# Patient Record
Sex: Female | Born: 1945 | Race: Black or African American | Hispanic: No | Marital: Married | Smoking: Current every day smoker
Health system: Southern US, Community
[De-identification: ages and names within clinical notes are randomized; demographics above are authoritative.]

## PROBLEM LIST (undated history)

## (undated) DIAGNOSIS — R05 Cough: Secondary | ICD-10-CM

## (undated) DIAGNOSIS — F419 Anxiety disorder, unspecified: Secondary | ICD-10-CM

## (undated) DIAGNOSIS — R059 Cough, unspecified: Secondary | ICD-10-CM

## (undated) DIAGNOSIS — K219 Gastro-esophageal reflux disease without esophagitis: Secondary | ICD-10-CM

## (undated) DIAGNOSIS — M1990 Unspecified osteoarthritis, unspecified site: Secondary | ICD-10-CM

## (undated) DIAGNOSIS — C50919 Malignant neoplasm of unspecified site of unspecified female breast: Secondary | ICD-10-CM

## (undated) DIAGNOSIS — Z1379 Encounter for other screening for genetic and chromosomal anomalies: Principal | ICD-10-CM

## (undated) DIAGNOSIS — G2581 Restless legs syndrome: Secondary | ICD-10-CM

## (undated) DIAGNOSIS — I10 Essential (primary) hypertension: Secondary | ICD-10-CM

## (undated) DIAGNOSIS — Z923 Personal history of irradiation: Secondary | ICD-10-CM

## (undated) DIAGNOSIS — Z803 Family history of malignant neoplasm of breast: Secondary | ICD-10-CM

## (undated) HISTORY — PX: ABDOMINAL HYSTERECTOMY: SHX81

## (undated) HISTORY — DX: Encounter for other screening for genetic and chromosomal anomalies: Z13.79

## (undated) HISTORY — DX: Malignant neoplasm of unspecified site of unspecified female breast: C50.919

## (undated) HISTORY — PX: BREAST SURGERY: SHX581

## (undated) HISTORY — PX: KIDNEY DONATION: SHX685

## (undated) HISTORY — DX: Family history of malignant neoplasm of breast: Z80.3

## (undated) HISTORY — PX: WRIST SURGERY: SHX841

---

## 1998-05-17 ENCOUNTER — Ambulatory Visit (HOSPITAL_BASED_OUTPATIENT_CLINIC_OR_DEPARTMENT_OTHER): Admission: RE | Admit: 1998-05-17 | Discharge: 1998-05-17 | Payer: Self-pay | Admitting: *Deleted

## 1998-10-18 ENCOUNTER — Ambulatory Visit (HOSPITAL_COMMUNITY): Admission: RE | Admit: 1998-10-18 | Discharge: 1998-10-18 | Payer: Self-pay | Admitting: Family Medicine

## 1998-10-18 ENCOUNTER — Encounter: Payer: Self-pay | Admitting: Family Medicine

## 1998-11-14 ENCOUNTER — Ambulatory Visit (HOSPITAL_BASED_OUTPATIENT_CLINIC_OR_DEPARTMENT_OTHER): Admission: RE | Admit: 1998-11-14 | Discharge: 1998-11-14 | Payer: Self-pay | Admitting: Orthopedic Surgery

## 1999-05-06 ENCOUNTER — Encounter: Payer: Self-pay | Admitting: Emergency Medicine

## 1999-05-06 ENCOUNTER — Emergency Department (HOSPITAL_COMMUNITY): Admission: EM | Admit: 1999-05-06 | Discharge: 1999-05-06 | Payer: Self-pay | Admitting: Emergency Medicine

## 1999-05-29 ENCOUNTER — Encounter: Admission: RE | Admit: 1999-05-29 | Discharge: 1999-07-03 | Payer: Self-pay | Admitting: *Deleted

## 2000-01-26 ENCOUNTER — Encounter: Payer: Self-pay | Admitting: Emergency Medicine

## 2000-01-26 ENCOUNTER — Emergency Department (HOSPITAL_COMMUNITY): Admission: EM | Admit: 2000-01-26 | Discharge: 2000-01-26 | Payer: Self-pay | Admitting: Emergency Medicine

## 2001-02-16 ENCOUNTER — Inpatient Hospital Stay (HOSPITAL_COMMUNITY): Admission: EM | Admit: 2001-02-16 | Discharge: 2001-02-20 | Payer: Self-pay | Admitting: Emergency Medicine

## 2001-03-15 ENCOUNTER — Ambulatory Visit (HOSPITAL_COMMUNITY): Admission: RE | Admit: 2001-03-15 | Discharge: 2001-03-15 | Payer: Self-pay

## 2007-04-19 ENCOUNTER — Emergency Department (HOSPITAL_COMMUNITY): Admission: EM | Admit: 2007-04-19 | Discharge: 2007-04-19 | Payer: Self-pay | Admitting: Family Medicine

## 2007-06-21 ENCOUNTER — Emergency Department (HOSPITAL_COMMUNITY): Admission: EM | Admit: 2007-06-21 | Discharge: 2007-06-21 | Payer: Self-pay | Admitting: Emergency Medicine

## 2008-09-01 ENCOUNTER — Emergency Department (HOSPITAL_COMMUNITY): Admission: EM | Admit: 2008-09-01 | Discharge: 2008-09-01 | Payer: Self-pay | Admitting: Family Medicine

## 2009-08-24 ENCOUNTER — Inpatient Hospital Stay (HOSPITAL_COMMUNITY): Admission: EM | Admit: 2009-08-24 | Discharge: 2009-08-27 | Payer: Self-pay | Admitting: Emergency Medicine

## 2011-03-07 LAB — COMPREHENSIVE METABOLIC PANEL
ALT: 10 U/L (ref 0–35)
ALT: 16 U/L (ref 0–35)
AST: 24 U/L (ref 0–37)
Albumin: 3.3 g/dL — ABNORMAL LOW (ref 3.5–5.2)
Albumin: 4.3 g/dL (ref 3.5–5.2)
Alkaline Phosphatase: 58 U/L (ref 39–117)
Alkaline Phosphatase: 72 U/L (ref 39–117)
CO2: 25 mEq/L (ref 19–32)
CO2: 26 mEq/L (ref 19–32)
Calcium: 8.5 mg/dL (ref 8.4–10.5)
Creatinine, Ser: 0.74 mg/dL (ref 0.4–1.2)
GFR calc Af Amer: >60 mL/min (ref >60)
GFR calc non Af Amer: >60 mL/min (ref >60)
Glucose, Bld: 123 mg/dL — ABNORMAL HIGH (ref 70–99)
Potassium: 3.9 mEq/L (ref 3.5–5.1)
Sodium: 140 mEq/L (ref 135–145)
Total Protein: 6.3 g/dL (ref 6.0–8.3)

## 2011-03-07 LAB — CBC
HCT: 38.8 % (ref 36.0–46.0)
MCHC: 33.5 g/dL (ref 30.0–36.0)
MCHC: 33.6 g/dL (ref 30.0–36.0)
MCV: 89.1 fL (ref 78.0–100.0)
Platelets: 288 10*3/uL (ref 150–400)
Platelets: 384 10*3/uL (ref 150–400)
RBC: 3.58 MIL/uL — ABNORMAL LOW (ref 3.87–5.11)
RBC: 4.36 MIL/uL (ref 3.87–5.11)
RDW: 13.4 % (ref 11.5–15.5)
WBC: 14.1 10*3/uL — ABNORMAL HIGH (ref 4.0–10.5)
WBC: 16 10*3/uL — ABNORMAL HIGH (ref 4.0–10.5)

## 2011-03-07 LAB — URINALYSIS, ROUTINE W REFLEX MICROSCOPIC
Glucose, UA: NEGATIVE mg/dL
Ketones, ur: NEGATIVE mg/dL
Nitrite: NEGATIVE
pH: 6.5 (ref 5.0–8.0)

## 2011-03-07 LAB — DIFFERENTIAL
Eosinophils Relative: 0 % (ref 0–5)
Lymphs Abs: 1.5 10*3/uL (ref 0.7–4.0)
Monocytes Absolute: 0.7 10*3/uL (ref 0.1–1.0)

## 2011-03-07 LAB — POTASSIUM: Potassium: 3.9 mEq/L (ref 3.5–5.1)

## 2011-03-07 LAB — PHOSPHORUS: Phosphorus: 2.7 mg/dL (ref 2.3–4.6)

## 2011-03-07 LAB — CREATININE, SERUM
Creatinine, Ser: 0.73 mg/dL (ref 0.4–1.2)
GFR calc Af Amer: >60 mL/min (ref >60)

## 2011-03-07 LAB — CK TOTAL AND CKMB (NOT AT ARMC): CK, MB: 2 ng/mL (ref 0.3–4.0)

## 2011-03-07 LAB — TROPONIN I: Troponin I: <0.01 ng/mL (ref 0.00–0.06)

## 2011-04-18 NOTE — Discharge Summary (Signed)
[place]. [facility]  Patient:    [name], [name]                      MRN: [id number] Adm. Date:  [id number] Disc. Date: [id number] Attending:  Nelta Numbers Dictator:   Thad Ranger McNeer, P.A.C. CC:         Dr. Robert Bellow, [facility]  Gerrit Friends. Dietrich Pates, M.D. [facility], [facility]   Discharge Summary  DISCHARGE DIAGNOSES: 1. Chest pain, status post borderline Cardiolite examination, status post    negative cardiac catheterization. 2. Tobacco abuse.  HOSPITAL COURSE:  The patient presented to the emergency room on February 16, 2001, for evaluation of chest pain after being [facility].  The pain was described as being across the chest, somewhat knifelike, at an 8/10.  There was no accompanying shortness of breath, diaphoresis, or nausea.  However, there was some lightheadedness.  After resting, the patient reported some residual heaviness. She also reported frequent indigestion symptoms for which she took over-the-counter medications. At the time of admission, she reported some residual heaviness.  She was seen and admitted by Gerrit Friends. Dietrich Pates, M.D.  While he felt that the symptoms were somewhat worrisome, he noted the patient had rather limited risk factors, negative EKG, and a benign clinical examination. His plan is to check serial enzymes and if they were negative, to proceed to a stress test the following morning.  He also noted a mild anemia with a hemoglobin of 11.7, hematocrit 35.0.  The following morning, the patient reported a headache followed by poor sleep. Her chest discomfort had faded.  Dr. Dietrich Pates ordered the discontinuation of her IV nitroglycerin and heparin.  His plan was to discharge if his stress test was negative.  Later that day, the patient underwent an exercise Cardiolite examination. She exercised 7 minutes 40 seconds according to Bruce protocol stage III.  Her maximum heart rate  was 148, which was above 85% predicted level.  Tests terminated due to fatigue.  There was no chest pain or diagnostic ST changes. Nuclear imaging revealed question of anterior ischemia with EF 59%.  After reviewing consultation with Madolyn Frieze. Jens Som, M.D., the patient was planned for cardiac catheterization.  The following day, the patient denied any chest pain or shortness of breath. She requested nicotine patch since she had been unable to smoke.  Later that day, the patient was taken to the catheterization lab by Arturo Morton. Riley Kill, M.D.  Coronary arteries were normal with the exception of a mild irregularity in the proximal area of the left anterior descending artery.  Left ventricular function was normal with ejection fraction of approximately 63.9%.  There was some mild diastolic mitral regurgitation.  No gradient noted.  That evening, the patient continued to complain of a headache.  Dr. Dietrich Pates felt that it was prudent to let the patient rest overnight and discharge the following morning.  DISCHARGE MEDICATIONS: 1. Protonix 40 mg q.d. 2. Phenergan 25 mg q.6h. p.r.n. nausea. 3. Vicodin 5/500 one tablet q.6h. p.r.n. headache, #16 only.  DISCHARGE INSTRUCTIONS: 1. The patient was advised to avoid heavy lifting, driving, sexual activity,    or heavy exertion for two days. 2. She is to be on a low fat, low salt, and low cholesterol diet. 3. She is to watch her catheterization site for pain, bleeding, swelling, or    discharge and to call the [place] office if any of these problems. 4.  She is to obtain over-the-counter nicotine patches and use them as    directed. 5. She is to follow up with Dr. Robert Bellow as needed or scheduled. 6. She is to follow up with Dr. Dietrich Pates as needed or scheduled.  LAB VALUES:  Total cholesterol 184, triglycerides 57, HDL 64, LDL 109, total cholesterol to HDL ratio 2.9.  White count 10.9, hemoglobin 10.3, hematocrit 30.8, RDW 13.4, platelets 319.   Serum iron 75, TIBC 258, saturation 28%. Sodium 139, potassium 3.8, chloride 107, CO2 27, BUN 14, creatinine 0.9, glucose 88.  Total protein 7.3, albumin 3.8, AST 20, ALT 13, alkaline phosphatase 68, total bilirubin 0.5.  Cardiac enzymes revealed CK 156, MB 1.9, troponin I 0.01.  Chest x-ray revealed no active disease.  Electrocardiogram revealed sinus bradycardia, rate of approximately 47 to 50. The PR interval was 0.138, QRS 0.088, QTC 0.413, axis 49. DD:  02/20/01 TD:  02/22/01 Job: [id number]

## 2011-04-18 NOTE — Cardiovascular Report (Signed)
[place]. [facility]  Patient:    [name], [name]                      MRN: [id number] Proc. Date: 02/18/01 Adm. Date:  [id number] Attending:  Nelta Numbers CC:         Jerl Mina, M.D.  Gerrit Friends. Dietrich Pates, M.D. [facility]  CV Laboratory   Cardiac Catheterization  INDICATIONS:  The patient is a 65 year old female smoker who was admitted with chest pain.  Evaluation revealed questionable anterior ischemia.  She was subsequently referred for cardiac catheterization.  She had nondiagnostic ST changes with exercise.  PROCEDURE: 1. Left heart catheterization. 2. Selective coronary arteriography 3. Selective left ventriculography.  CARDIOLOGIST:  Arturo Morton. Riley Kill, M.D. [facility]  DESCRIPTION OF THE PROCEDURE:  The procedure was performed from the right femoral artery using 6-French catheter.  She tolerated the procedure well without complication.  She was taken to the holding area in satisfactory condition.  HEMODYNAMIC DATA: The central aortic pressure was 166/89.  LV pressure 150/20.  There was no gradient on pullback across the aortic valve.  ANGIOGRAPHIC DATA:  Ventriculography performed in the RAO projection reveals preserved global systolic function.  There was mild diastolic mitral regurgitation.  Ejection fraction was 63.9%.  The left main coronary artery was free of significant disease.  The left anterior descending artery is a large tortuous vessel that has very mild irregularity in the first bend.  However, on multiple views and careful analysis, there does not appear to be high-grade focal stenosis.  The distal vessel wraps the apex where it bifurcates.  The circumflex is a dominant vessel that provides a first marginal branch that bifurcates, a second large marginal branch, and a posterior descending and posterolateral system.  This appears to be free of significant disease.  The right coronary artery is a nondominant vessel.  There  is no high-grade focal stenosis.  CONCLUSIONS: 1. Preserved left ventricular function. 2. No apparent critical coronary artery disease.  DISPOSITION:  Final plans per Dr. Dietrich Pates. DD:  02/18/01 TD:  02/19/01 Job: [id number]

## 2011-09-15 LAB — CBC
RDW: 13.8
WBC: 11.4 — ABNORMAL HIGH

## 2011-09-15 LAB — DIFFERENTIAL
Basophils Absolute: 0.1
Basophils Relative: 1
Eosinophils Absolute: 0.1
Eosinophils Relative: 1
Lymphocytes Relative: 24
Lymphs Abs: 2.7
Monocytes Absolute: 0.9 — ABNORMAL HIGH
Monocytes Relative: 8
Neutro Abs: 7.6
Neutrophils Relative %: 67

## 2011-09-15 LAB — SEDIMENTATION RATE: Sed Rate: 13

## 2012-05-13 ENCOUNTER — Other Ambulatory Visit: Payer: Self-pay | Admitting: *Deleted

## 2012-08-16 ENCOUNTER — Other Ambulatory Visit: Payer: Self-pay | Admitting: *Deleted

## 2012-08-27 ENCOUNTER — Other Ambulatory Visit: Payer: Self-pay | Admitting: *Deleted

## 2012-08-27 DIAGNOSIS — R509 Fever, unspecified: Secondary | ICD-10-CM

## 2012-08-27 MED ORDER — LEVOFLOXACIN 500 MG PO TABS: 500.0000 mg | ORAL_TABLET | Freq: Every day | ORAL | Status: AC

## 2012-12-26 ENCOUNTER — Emergency Department (INDEPENDENT_AMBULATORY_CARE_PROVIDER_SITE_OTHER)
Admission: EM | Admit: 2012-12-26 | Discharge: 2012-12-26 | Disposition: A | Discharge status: Home / Self Care | Payer: Self-pay | Source: Home / Self Care

## 2012-12-26 ENCOUNTER — Emergency Department (INDEPENDENT_AMBULATORY_CARE_PROVIDER_SITE_OTHER): Payer: Self-pay

## 2012-12-26 ENCOUNTER — Encounter (HOSPITAL_COMMUNITY): Payer: Self-pay | Admitting: *Deleted

## 2012-12-26 DIAGNOSIS — Z720 Tobacco use: Secondary | ICD-10-CM

## 2012-12-26 DIAGNOSIS — R05 Cough: Secondary | ICD-10-CM

## 2012-12-26 DIAGNOSIS — F17200 Nicotine dependence, unspecified, uncomplicated: Secondary | ICD-10-CM

## 2012-12-26 DIAGNOSIS — J40 Bronchitis, not specified as acute or chronic: Secondary | ICD-10-CM

## 2012-12-26 MED ORDER — AZITHROMYCIN 250 MG PO TABS: 250.0000 mg | ORAL_TABLET | Freq: Every day | ORAL | Status: DC

## 2012-12-26 MED ORDER — ALBUTEROL SULFATE HFA 108 (90 BASE) MCG/ACT IN AERS: 2.0000 | INHALATION_SPRAY | RESPIRATORY_TRACT | Status: DC | PRN

## 2012-12-26 MED ORDER — GUAIFENESIN-CODEINE 100-10 MG/5ML PO SYRP: 5.0000 mL | ORAL_SOLUTION | Freq: Four times a day (QID) | ORAL | Status: DC | PRN

## 2012-12-26 NOTE — ED Provider Notes (Signed)
History     CSN: [id number]  Arrival date & time 12/26/12  1150   None     Chief Complaint  Patient presents with  . Cough    (Consider location/radiation/quality/duration/timing/severity/associated sxs/prior treatment) HPI Comments: 67 year old female presents with a cough for 4 weeks. This is associated with chest congestion which was initially productive with brown sputum but now with clear sputum. The first week she had fever but since has not experienced fever. She does feel more tired than usual. She denies earache, sore throat, abdominal pain. She does complain of left posterior lateral chest pain at the left posterior axillary line. This is worse with coughing. She is currently smoking one half pack of cigarettes per day and has been smoking for over 45 years.  Patient is a 67 y.o. female presenting with cough.  Cough Associated symptoms include sore throat, shortness of breath and wheezing. Pertinent negatives include no chills and no rhinorrhea.    History reviewed. No pertinent past medical history.  Past Surgical History  Procedure Date  . Abdominal hysterectomy   . Wrist surgery     cyst removal    No family history on file.  History  Substance Use Topics  . Smoking status: Current Every Day Smoker -- 0.8 packs/day for 47 years    Types: Cigarettes  . Smokeless tobacco: Not on file  . Alcohol Use: No    OB History    Grav Para Term Preterm Abortions TAB SAB Ect Mult Living                  Review of Systems  Constitutional: Positive for fever. Negative for chills, activity change, appetite change and fatigue.  HENT: Positive for sore throat. Negative for congestion, facial swelling, rhinorrhea, neck pain, neck stiffness and postnasal drip.   Eyes: Negative.   Respiratory: Positive for cough, shortness of breath and wheezing.   Cardiovascular: Negative.   Gastrointestinal: Negative.   Genitourinary: Negative.   Musculoskeletal: Positive for back  pain.  Skin: Negative for pallor and rash.  Neurological: Negative.     Allergies  Demerol  Home Medications   Current Outpatient Rx  Name  Route  Sig  Dispense  Refill  . ALBUTEROL SULFATE HFA 108 (90 BASE) MCG/ACT IN AERS   Inhalation   Inhale 2 puffs into the lungs every 4 (four) hours as needed for wheezing.   1 Inhaler   0   . AZITHROMYCIN 250 MG PO TABS   Oral   Take 1 tablet (250 mg total) by mouth daily. 2 tabs po on day one, then one tablet po once daily on days 2-5.   6 tablet   0   . GUAIFENESIN-CODEINE 100-10 MG/5ML PO SYRP   Oral   Take 5 mLs by mouth 4 (four) times daily as needed for cough or congestion. 1 to 2 teaspoons every 4 hours as needed for cough and congestion   120 mL   0     BP 181/68  Pulse 67  Temp 98.3 F (36.8 C) (Oral)  Resp 17  SpO2 98%  Physical Exam  Nursing note and vitals reviewed. Constitutional: She is oriented to person, place, and time. She appears well-developed and well-nourished. No distress.  HENT:  Nose: Nose normal.  Mouth/Throat: Oropharynx is clear and moist. No oropharyngeal exudate.  Eyes: Conjunctivae normal and EOM are normal.  Neck: Normal range of motion. Neck supple.  Cardiovascular: Normal rate and regular rhythm.   Pulmonary/Chest: Effort normal.  No respiratory distress. She has wheezes. She exhibits tenderness.       Generalized diminished breath sounds. Inspiratory wheezing. Prolonged expiratory phase. No crackles are heard. Tenderness at the left posterior axillary line over the ribs.  Abdominal: Soft. There is no tenderness.  Musculoskeletal: Normal range of motion. She exhibits no edema.  Lymphadenopathy:    She has no cervical adenopathy.  Neurological: She is alert and oriented to person, place, and time.  Skin: Skin is warm and dry. No rash noted.  Psychiatric: She has a normal mood and affect.    ED Course  Procedures (including critical care time)  Labs Reviewed - No data to display Dg  Chest 2 View  12/26/2012  *RADIOLOGY REPORT*  Clinical Data: Cough  CHEST - 2 VIEW  Comparison: None.  Findings: Cardiomediastinal silhouette is unremarkable.  No acute infiltrate or pleural effusion.  No pulmonary edema.  Minimal central bronchitic changes. Mild degenerative changes mid thoracic spine.  IMPRESSION: No acute infiltrate or pulmonary edema.  Minimal central bronchitic changes.   Original Report Authenticated By: Natasha Mead, M.D.      1. Bronchitis   2. Tobacco abuse disorder   3. Cough       MDM  ZPACK as directed Robitussin-DM 1-2 teaspoons every 4 hours when necessary cough and congestion Albuterol HFA 2 puffs every 4-6 hours when necessary cough and wheeze. Followup with your doctor in the next week. Recheck promptly for any new symptoms problems or worsening Stop        Hayden Rasmussen, NP 12/26/12 1538  Hayden Rasmussen, NP 12/26/12 1913

## 2012-12-26 NOTE — ED Notes (Signed)
C/O cough x 4 wks, which is now productive.  Also has left lateral rib pain with coughing now.  Had fevers at initial onset of sxs, but believes she has had none recently.  Denies n/v.

## 2012-12-26 NOTE — ED Notes (Signed)
Harsh, productive cough noted.

## 2012-12-27 NOTE — ED Provider Notes (Signed)
Medical screening examination/treatment/procedure(s) were performed by resident physician or non-physician practitioner and as supervising physician I was immediately available for consultation/collaboration.   Laurynn Mccorvey DOUGLAS MD.    Ajene Carchi D Synda Bagent, MD 12/27/12 1602 

## 2013-01-21 NOTE — ED Notes (Signed)
Call on answering machine, pt requesting new Rx from 1-26 visit; on review, given length of time and continued syx, message left for patient to return to [facility] or see her own regular MD

## 2013-04-18 ENCOUNTER — Other Ambulatory Visit: Payer: Self-pay | Admitting: *Deleted

## 2013-04-18 NOTE — Telephone Encounter (Signed)
error 

## 2013-05-19 ENCOUNTER — Other Ambulatory Visit: Payer: Self-pay | Admitting: *Deleted

## 2013-05-19 NOTE — Telephone Encounter (Signed)
error 

## 2013-10-21 ENCOUNTER — Other Ambulatory Visit: Payer: Self-pay | Admitting: Nurse Practitioner

## 2014-08-02 ENCOUNTER — Other Ambulatory Visit: Payer: Self-pay | Admitting: *Deleted

## 2014-12-01 HISTORY — PX: BREAST BIOPSY: SHX20

## 2015-01-05 ENCOUNTER — Ambulatory Visit: Payer: Self-pay | Admitting: Family

## 2015-07-03 DIAGNOSIS — Z13220 Encounter for screening for lipoid disorders: Secondary | ICD-10-CM | POA: Diagnosis not present

## 2015-07-03 DIAGNOSIS — F17200 Nicotine dependence, unspecified, uncomplicated: Secondary | ICD-10-CM | POA: Diagnosis not present

## 2015-07-03 DIAGNOSIS — Z79899 Other long term (current) drug therapy: Secondary | ICD-10-CM | POA: Diagnosis not present

## 2015-07-03 DIAGNOSIS — F419 Anxiety disorder, unspecified: Secondary | ICD-10-CM | POA: Diagnosis not present

## 2015-07-03 DIAGNOSIS — Z131 Encounter for screening for diabetes mellitus: Secondary | ICD-10-CM | POA: Diagnosis not present

## 2015-07-03 DIAGNOSIS — M2550 Pain in unspecified joint: Secondary | ICD-10-CM | POA: Diagnosis not present

## 2015-07-04 ENCOUNTER — Other Ambulatory Visit: Payer: Self-pay | Admitting: Internal Medicine

## 2015-07-04 DIAGNOSIS — E2839 Other primary ovarian failure: Secondary | ICD-10-CM

## 2015-07-05 ENCOUNTER — Other Ambulatory Visit: Payer: Self-pay

## 2015-07-05 DIAGNOSIS — Z1231 Encounter for screening mammogram for malignant neoplasm of breast: Secondary | ICD-10-CM

## 2015-07-12 ENCOUNTER — Ambulatory Visit
Admission: RE | Admit: 2015-07-12 | Discharge: 2015-07-12 | Disposition: A | Discharge status: Home / Self Care | Payer: Medicare Other | Source: Ambulatory Visit

## 2015-07-12 ENCOUNTER — Ambulatory Visit
Admission: RE | Admit: 2015-07-12 | Discharge: 2015-07-12 | Disposition: A | Discharge status: Home / Self Care | Payer: Medicare Other | Source: Ambulatory Visit | Attending: Internal Medicine | Admitting: Internal Medicine

## 2015-07-12 DIAGNOSIS — E2839 Other primary ovarian failure: Secondary | ICD-10-CM

## 2015-07-12 DIAGNOSIS — Z1231 Encounter for screening mammogram for malignant neoplasm of breast: Secondary | ICD-10-CM

## 2015-07-12 DIAGNOSIS — M85852 Other specified disorders of bone density and structure, left thigh: Secondary | ICD-10-CM | POA: Diagnosis not present

## 2015-07-12 DIAGNOSIS — M8588 Other specified disorders of bone density and structure, other site: Secondary | ICD-10-CM | POA: Diagnosis not present

## 2015-07-12 DIAGNOSIS — Z780 Asymptomatic menopausal state: Secondary | ICD-10-CM | POA: Diagnosis not present

## 2015-07-13 ENCOUNTER — Other Ambulatory Visit: Payer: Self-pay | Admitting: Internal Medicine

## 2015-07-13 DIAGNOSIS — R928 Other abnormal and inconclusive findings on diagnostic imaging of breast: Secondary | ICD-10-CM

## 2015-07-19 ENCOUNTER — Other Ambulatory Visit: Payer: Medicare Other

## 2015-08-09 ENCOUNTER — Other Ambulatory Visit: Payer: Self-pay | Admitting: Internal Medicine

## 2015-08-09 ENCOUNTER — Ambulatory Visit
Admission: RE | Admit: 2015-08-09 | Discharge: 2015-08-09 | Disposition: A | Discharge status: Home / Self Care | Payer: Medicare Other | Source: Ambulatory Visit | Attending: Internal Medicine | Admitting: Internal Medicine

## 2015-08-09 DIAGNOSIS — N6320 Unspecified lump in the left breast, unspecified quadrant: Secondary | ICD-10-CM

## 2015-08-09 DIAGNOSIS — R928 Other abnormal and inconclusive findings on diagnostic imaging of breast: Secondary | ICD-10-CM

## 2015-08-09 DIAGNOSIS — N6489 Other specified disorders of breast: Secondary | ICD-10-CM | POA: Diagnosis not present

## 2015-08-16 ENCOUNTER — Ambulatory Visit
Admission: RE | Admit: 2015-08-16 | Discharge: 2015-08-16 | Disposition: A | Discharge status: Home / Self Care | Payer: Medicare Other | Source: Ambulatory Visit | Attending: Internal Medicine | Admitting: Internal Medicine

## 2015-08-16 ENCOUNTER — Other Ambulatory Visit: Payer: Self-pay | Admitting: Internal Medicine

## 2015-08-16 DIAGNOSIS — R928 Other abnormal and inconclusive findings on diagnostic imaging of breast: Secondary | ICD-10-CM | POA: Diagnosis not present

## 2015-08-16 DIAGNOSIS — N6320 Unspecified lump in the left breast, unspecified quadrant: Secondary | ICD-10-CM

## 2015-08-16 DIAGNOSIS — N6489 Other specified disorders of breast: Secondary | ICD-10-CM | POA: Diagnosis not present

## 2015-08-23 DIAGNOSIS — M899 Disorder of bone, unspecified: Secondary | ICD-10-CM | POA: Diagnosis not present

## 2015-08-23 DIAGNOSIS — F419 Anxiety disorder, unspecified: Secondary | ICD-10-CM | POA: Diagnosis not present

## 2015-08-23 DIAGNOSIS — M2550 Pain in unspecified joint: Secondary | ICD-10-CM | POA: Diagnosis not present

## 2015-08-23 DIAGNOSIS — F17210 Nicotine dependence, cigarettes, uncomplicated: Secondary | ICD-10-CM | POA: Diagnosis not present

## 2015-08-23 DIAGNOSIS — N6022 Fibroadenosis of left breast: Secondary | ICD-10-CM | POA: Diagnosis not present

## 2015-08-29 DIAGNOSIS — Z1211 Encounter for screening for malignant neoplasm of colon: Secondary | ICD-10-CM | POA: Diagnosis not present

## 2015-08-29 DIAGNOSIS — D500 Iron deficiency anemia secondary to blood loss (chronic): Secondary | ICD-10-CM | POA: Diagnosis not present

## 2015-09-27 DIAGNOSIS — D122 Benign neoplasm of ascending colon: Secondary | ICD-10-CM | POA: Diagnosis not present

## 2015-09-27 DIAGNOSIS — D123 Benign neoplasm of transverse colon: Secondary | ICD-10-CM | POA: Diagnosis not present

## 2015-09-27 DIAGNOSIS — D124 Benign neoplasm of descending colon: Secondary | ICD-10-CM | POA: Diagnosis not present

## 2015-09-27 DIAGNOSIS — K635 Polyp of colon: Secondary | ICD-10-CM | POA: Diagnosis not present

## 2015-09-27 DIAGNOSIS — K5730 Diverticulosis of large intestine without perforation or abscess without bleeding: Secondary | ICD-10-CM | POA: Diagnosis not present

## 2015-09-27 DIAGNOSIS — Z1211 Encounter for screening for malignant neoplasm of colon: Secondary | ICD-10-CM | POA: Diagnosis not present

## 2015-11-28 DIAGNOSIS — G5601 Carpal tunnel syndrome, right upper limb: Secondary | ICD-10-CM | POA: Diagnosis not present

## 2015-11-28 DIAGNOSIS — J069 Acute upper respiratory infection, unspecified: Secondary | ICD-10-CM | POA: Diagnosis not present

## 2016-01-14 ENCOUNTER — Other Ambulatory Visit: Payer: Self-pay | Admitting: Internal Medicine

## 2016-01-14 DIAGNOSIS — N6320 Unspecified lump in the left breast, unspecified quadrant: Secondary | ICD-10-CM

## 2016-01-24 DIAGNOSIS — M180 Bilateral primary osteoarthritis of first carpometacarpal joints: Secondary | ICD-10-CM | POA: Insufficient documentation

## 2016-02-14 ENCOUNTER — Other Ambulatory Visit: Payer: Medicare Other

## 2016-03-27 ENCOUNTER — Ambulatory Visit
Admission: RE | Admit: 2016-03-27 | Discharge: 2016-03-27 | Disposition: A | Discharge status: Home / Self Care | Payer: Medicare Other | Source: Ambulatory Visit | Attending: Internal Medicine | Admitting: Internal Medicine

## 2016-03-27 DIAGNOSIS — N6320 Unspecified lump in the left breast, unspecified quadrant: Secondary | ICD-10-CM

## 2016-09-22 ENCOUNTER — Other Ambulatory Visit: Payer: Self-pay | Admitting: Orthopedic Surgery

## 2016-10-27 ENCOUNTER — Encounter (HOSPITAL_BASED_OUTPATIENT_CLINIC_OR_DEPARTMENT_OTHER): Payer: Self-pay | Admitting: *Deleted

## 2016-10-28 ENCOUNTER — Encounter (HOSPITAL_BASED_OUTPATIENT_CLINIC_OR_DEPARTMENT_OTHER)
Admission: RE | Admit: 2016-10-28 | Discharge: 2016-10-28 | Disposition: A | Discharge status: Home / Self Care | Payer: Medicare Other | Source: Ambulatory Visit | Attending: Orthopedic Surgery | Admitting: Orthopedic Surgery

## 2016-10-28 ENCOUNTER — Other Ambulatory Visit: Payer: Self-pay

## 2016-10-28 DIAGNOSIS — I10 Essential (primary) hypertension: Secondary | ICD-10-CM | POA: Diagnosis not present

## 2016-10-28 DIAGNOSIS — F17210 Nicotine dependence, cigarettes, uncomplicated: Secondary | ICD-10-CM | POA: Diagnosis not present

## 2016-10-28 DIAGNOSIS — M13841 Other specified arthritis, right hand: Secondary | ICD-10-CM | POA: Diagnosis present

## 2016-10-28 DIAGNOSIS — F419 Anxiety disorder, unspecified: Secondary | ICD-10-CM | POA: Diagnosis not present

## 2016-10-28 DIAGNOSIS — Z01818 Encounter for other preprocedural examination: Secondary | ICD-10-CM | POA: Insufficient documentation

## 2016-10-28 DIAGNOSIS — M1811 Unilateral primary osteoarthritis of first carpometacarpal joint, right hand: Secondary | ICD-10-CM | POA: Diagnosis not present

## 2016-10-30 ENCOUNTER — Ambulatory Visit (HOSPITAL_BASED_OUTPATIENT_CLINIC_OR_DEPARTMENT_OTHER): Payer: Medicare Other | Admitting: Anesthesiology

## 2016-10-30 ENCOUNTER — Encounter (HOSPITAL_BASED_OUTPATIENT_CLINIC_OR_DEPARTMENT_OTHER)
Admission: RE | Disposition: A | Discharge status: Home / Self Care | Payer: Self-pay | Source: Ambulatory Visit | Attending: Orthopedic Surgery

## 2016-10-30 ENCOUNTER — Ambulatory Visit (HOSPITAL_BASED_OUTPATIENT_CLINIC_OR_DEPARTMENT_OTHER)
Admission: RE | Admit: 2016-10-30 | Discharge: 2016-10-30 | Disposition: A | Discharge status: Home / Self Care | Payer: Medicare Other | Source: Ambulatory Visit | Attending: Orthopedic Surgery | Admitting: Orthopedic Surgery

## 2016-10-30 ENCOUNTER — Encounter (HOSPITAL_BASED_OUTPATIENT_CLINIC_OR_DEPARTMENT_OTHER): Payer: Self-pay | Admitting: Anesthesiology

## 2016-10-30 DIAGNOSIS — M1811 Unilateral primary osteoarthritis of first carpometacarpal joint, right hand: Secondary | ICD-10-CM | POA: Insufficient documentation

## 2016-10-30 DIAGNOSIS — I10 Essential (primary) hypertension: Secondary | ICD-10-CM | POA: Insufficient documentation

## 2016-10-30 DIAGNOSIS — F419 Anxiety disorder, unspecified: Secondary | ICD-10-CM | POA: Insufficient documentation

## 2016-10-30 DIAGNOSIS — F17210 Nicotine dependence, cigarettes, uncomplicated: Secondary | ICD-10-CM | POA: Insufficient documentation

## 2016-10-30 HISTORY — PX: CARPOMETACARPEL SUSPENSION PLASTY: SHX5005

## 2016-10-30 HISTORY — DX: Anxiety disorder, unspecified: F41.9

## 2016-10-30 HISTORY — DX: Essential (primary) hypertension: I10

## 2016-10-30 HISTORY — DX: Unspecified osteoarthritis, unspecified site: M19.90

## 2016-10-30 SURGERY — CARPOMETACARPEL (CMC) SUSPENSION PLASTY
Anesthesia: Regional | Site: Arm Lower | Laterality: Right

## 2016-10-30 MED ORDER — FENTANYL CITRATE (PF) 100 MCG/2ML IJ SOLN
INTRAMUSCULAR | Status: AC
  Filled 2016-10-30: qty 2

## 2016-10-30 MED ORDER — HYDROMORPHONE HCL 1 MG/ML IJ SOLN
0.2500 mg | INTRAMUSCULAR | Status: DC | PRN
  Administered 2016-10-30 (×3): 0.5 mg via INTRAVENOUS

## 2016-10-30 MED ORDER — HYDROMORPHONE HCL 1 MG/ML IJ SOLN
INTRAMUSCULAR | Status: AC
  Filled 2016-10-30: qty 1

## 2016-10-30 MED ORDER — PROPOFOL 10 MG/ML IV BOLUS
INTRAVENOUS | Status: AC
  Filled 2016-10-30: qty 20

## 2016-10-30 MED ORDER — MIDAZOLAM HCL 5 MG/5ML IJ SOLN
INTRAMUSCULAR | Status: DC | PRN
  Administered 2016-10-30: 2 mg via INTRAVENOUS

## 2016-10-30 MED ORDER — FENTANYL CITRATE (PF) 100 MCG/2ML IJ SOLN
50.0000 ug | INTRAMUSCULAR | Status: DC | PRN
  Administered 2016-10-30: 50 ug via INTRAVENOUS

## 2016-10-30 MED ORDER — LIDOCAINE HCL (PF) 1 % IJ SOLN
INTRAMUSCULAR | Status: AC
  Filled 2016-10-30: qty 30

## 2016-10-30 MED ORDER — CEFAZOLIN SODIUM-DEXTROSE 2-4 GM/100ML-% IV SOLN
2.0000 g | INTRAVENOUS | Status: AC
  Administered 2016-10-30: 2 g via INTRAVENOUS

## 2016-10-30 MED ORDER — EPHEDRINE SULFATE 50 MG/ML IJ SOLN
INTRAMUSCULAR | Status: DC | PRN
  Administered 2016-10-30 (×2): 10 mg via INTRAVENOUS

## 2016-10-30 MED ORDER — ONDANSETRON HCL 4 MG/2ML IJ SOLN
INTRAMUSCULAR | Status: DC | PRN
  Administered 2016-10-30: 4 mg via INTRAVENOUS

## 2016-10-30 MED ORDER — LACTATED RINGERS IV SOLN
INTRAVENOUS | Status: DC
  Administered 2016-10-30 (×2): via INTRAVENOUS

## 2016-10-30 MED ORDER — FENTANYL CITRATE (PF) 100 MCG/2ML IJ SOLN
INTRAMUSCULAR | Status: DC | PRN
  Administered 2016-10-30 (×2): 50 ug via INTRAVENOUS

## 2016-10-30 MED ORDER — LIDOCAINE HCL (CARDIAC) 20 MG/ML IV SOLN
INTRAVENOUS | Status: DC | PRN
  Administered 2016-10-30: 30 mg via INTRAVENOUS

## 2016-10-30 MED ORDER — MIDAZOLAM HCL 2 MG/2ML IJ SOLN
INTRAMUSCULAR | Status: AC
  Filled 2016-10-30: qty 2

## 2016-10-30 MED ORDER — DEXAMETHASONE SODIUM PHOSPHATE 10 MG/ML IJ SOLN
INTRAMUSCULAR | Status: DC | PRN
  Administered 2016-10-30: 10 mg via INTRAVENOUS

## 2016-10-30 MED ORDER — CHLORHEXIDINE GLUCONATE 4 % EX LIQD: 60.0000 mL | Freq: Once | CUTANEOUS | Status: DC

## 2016-10-30 MED ORDER — GLYCOPYRROLATE 0.2 MG/ML IJ SOLN
INTRAMUSCULAR | Status: DC | PRN
  Administered 2016-10-30: 0.2 mg via INTRAVENOUS

## 2016-10-30 MED ORDER — BUPIVACAINE HCL (PF) 0.25 % IJ SOLN
INTRAMUSCULAR | Status: AC
  Filled 2016-10-30: qty 30

## 2016-10-30 MED ORDER — ONDANSETRON HCL 4 MG/2ML IJ SOLN
INTRAMUSCULAR | Status: AC
  Filled 2016-10-30: qty 2

## 2016-10-30 MED ORDER — GLYCOPYRROLATE 0.2 MG/ML IV SOSY
PREFILLED_SYRINGE | INTRAVENOUS | Status: AC
  Filled 2016-10-30: qty 3

## 2016-10-30 MED ORDER — LIDOCAINE HCL (PF) 0.5 % IJ SOLN
INTRAMUSCULAR | Status: AC
  Filled 2016-10-30: qty 100

## 2016-10-30 MED ORDER — ONDANSETRON HCL 4 MG/2ML IJ SOLN: 4.0000 mg | Freq: Once | INTRAMUSCULAR | Status: DC | PRN

## 2016-10-30 MED ORDER — DEXAMETHASONE SODIUM PHOSPHATE 10 MG/ML IJ SOLN
INTRAMUSCULAR | Status: AC
  Filled 2016-10-30: qty 1

## 2016-10-30 MED ORDER — PHENYLEPHRINE 40 MCG/ML (10ML) SYRINGE FOR IV PUSH (FOR BLOOD PRESSURE SUPPORT)
PREFILLED_SYRINGE | INTRAVENOUS | Status: AC
  Filled 2016-10-30: qty 10

## 2016-10-30 MED ORDER — OXYCODONE-ACETAMINOPHEN 5-325 MG PO TABS
ORAL_TABLET | ORAL | Status: AC
  Filled 2016-10-30: qty 1

## 2016-10-30 MED ORDER — LIDOCAINE 2% (20 MG/ML) 5 ML SYRINGE
INTRAMUSCULAR | Status: AC
  Filled 2016-10-30: qty 5

## 2016-10-30 MED ORDER — MIDAZOLAM HCL 2 MG/2ML IJ SOLN
1.0000 mg | INTRAMUSCULAR | Status: DC | PRN
  Administered 2016-10-30: 2 mg via INTRAVENOUS

## 2016-10-30 MED ORDER — CEFAZOLIN SODIUM-DEXTROSE 2-4 GM/100ML-% IV SOLN
INTRAVENOUS | Status: AC
  Filled 2016-10-30: qty 100

## 2016-10-30 MED ORDER — BUPIVACAINE-EPINEPHRINE (PF) 0.5% -1:200000 IJ SOLN
INTRAMUSCULAR | Status: DC | PRN
  Administered 2016-10-30: 30 mL via PERINEURAL

## 2016-10-30 MED ORDER — OXYCODONE-ACETAMINOPHEN 5-325 MG PO TABS
1.0000 | ORAL_TABLET | Freq: Once | ORAL | Status: AC
  Administered 2016-10-30: 1 via ORAL

## 2016-10-30 MED ORDER — OXYCODONE-ACETAMINOPHEN 7.5-325 MG PO TABS: 1.0000 | ORAL_TABLET | ORAL | 0 refills | Status: DC | PRN

## 2016-10-30 MED ORDER — EPHEDRINE 5 MG/ML INJ
INTRAVENOUS | Status: AC
  Filled 2016-10-30: qty 10

## 2016-10-30 MED ORDER — PROPOFOL 10 MG/ML IV BOLUS
INTRAVENOUS | Status: DC | PRN
  Administered 2016-10-30: 120 mg via INTRAVENOUS

## 2016-10-30 MED ORDER — SCOPOLAMINE 1 MG/3DAYS TD PT72: 1.0000 | MEDICATED_PATCH | Freq: Once | TRANSDERMAL | Status: DC | PRN

## 2016-10-30 MED ORDER — PHENYLEPHRINE HCL 10 MG/ML IJ SOLN
INTRAMUSCULAR | Status: DC | PRN
  Administered 2016-10-30 (×3): 80 ug via INTRAVENOUS

## 2016-10-30 SURGICAL SUPPLY — 64 items
BLADE ARTHRO LOK 4 BEAVER (BLADE) IMPLANT
BLADE ARTHRO LOK 4MM BEAVER (BLADE)
BLADE MINI RND TIP GREEN BEAV (BLADE) ×3 IMPLANT
BLADE SURG 15 STRL LF DISP TIS (BLADE) ×1 IMPLANT
BLADE SURG 15 STRL SS (BLADE) ×3
BNDG CMPR 9X4 STRL LF SNTH (GAUZE/BANDAGES/DRESSINGS) ×1
BNDG COHESIVE 3X5 TAN STRL LF (GAUZE/BANDAGES/DRESSINGS) ×3 IMPLANT
BNDG ESMARK 4X9 LF (GAUZE/BANDAGES/DRESSINGS) ×3 IMPLANT
BNDG GAUZE ELAST 4 BULKY (GAUZE/BANDAGES/DRESSINGS) ×3 IMPLANT
BUR EGG 3PK/BX (BURR) IMPLANT
CHLORAPREP W/TINT 26ML (MISCELLANEOUS) ×3 IMPLANT
CORDS BIPOLAR (ELECTRODE) ×3 IMPLANT
COVER BACK TABLE 60X90IN (DRAPES) ×3 IMPLANT
COVER MAYO STAND STRL (DRAPES) ×3 IMPLANT
CUFF TOURNIQUET SINGLE 18IN (TOURNIQUET CUFF) IMPLANT
DECANTER SPIKE VIAL GLASS SM (MISCELLANEOUS) IMPLANT
DRAPE EXTREMITY T 121X128X90 (DRAPE) ×3 IMPLANT
DRAPE OEC MINIVIEW 54X84 (DRAPES) ×3 IMPLANT
DRAPE SURG 17X23 STRL (DRAPES) ×3 IMPLANT
GAUZE SPONGE 4X4 12PLY STRL (GAUZE/BANDAGES/DRESSINGS) ×3 IMPLANT
GAUZE SPONGE 4X4 16PLY XRAY LF (GAUZE/BANDAGES/DRESSINGS) IMPLANT
GAUZE XEROFORM 1X8 LF (GAUZE/BANDAGES/DRESSINGS) ×3 IMPLANT
GLOVE BIOGEL M STRL SZ7.5 (GLOVE) ×2 IMPLANT
GLOVE BIOGEL PI IND STRL 7.5 (GLOVE) IMPLANT
GLOVE BIOGEL PI IND STRL 8 (GLOVE) IMPLANT
GLOVE BIOGEL PI IND STRL 8.5 (GLOVE) ×1 IMPLANT
GLOVE BIOGEL PI INDICATOR 7.5 (GLOVE) ×2
GLOVE BIOGEL PI INDICATOR 8 (GLOVE) ×2
GLOVE BIOGEL PI INDICATOR 8.5 (GLOVE) ×2
GLOVE SURG ORTHO 8.0 STRL STRW (GLOVE) ×3 IMPLANT
GOWN STRL REUS W/ TWL LRG LVL3 (GOWN DISPOSABLE) ×1 IMPLANT
GOWN STRL REUS W/TWL 2XL LVL3 (GOWN DISPOSABLE) ×2 IMPLANT
GOWN STRL REUS W/TWL LRG LVL3 (GOWN DISPOSABLE) ×3
GOWN STRL REUS W/TWL XL LVL3 (GOWN DISPOSABLE) ×5 IMPLANT
NDL PRECISIONGLIDE 27X1.5 (NEEDLE) IMPLANT
NEEDLE PRECISIONGLIDE 27X1.5 (NEEDLE) IMPLANT
NS IRRIG 1000ML POUR BTL (IV SOLUTION) ×3 IMPLANT
PACK BASIN DAY SURGERY FS (CUSTOM PROCEDURE TRAY) ×3 IMPLANT
PAD CAST 3X4 CTTN HI CHSV (CAST SUPPLIES) ×1 IMPLANT
PADDING CAST ABS 3INX4YD NS (CAST SUPPLIES)
PADDING CAST ABS COTTON 3X4 (CAST SUPPLIES) IMPLANT
PADDING CAST COTTON 3X4 STRL (CAST SUPPLIES) ×3
RUBBERBAND STERILE (MISCELLANEOUS) IMPLANT
SLEEVE SCD COMPRESS KNEE MED (MISCELLANEOUS) ×3 IMPLANT
SPLINT PLASTER CAST XFAST 3X15 (CAST SUPPLIES) IMPLANT
SPLINT PLASTER XTRA FASTSET 3X (CAST SUPPLIES)
STOCKINETTE 4X48 STRL (DRAPES) ×3 IMPLANT
SUT ETHIBOND 2 OS 4 DA (SUTURE) IMPLANT
SUT ETHIBOND 3-0 V-5 (SUTURE) IMPLANT
SUT ETHILON 4 0 PS 2 18 (SUTURE) ×6 IMPLANT
SUT FIBERWIRE 2-0 18 17.9 3/8 (SUTURE)
SUT FIBERWIRE 4-0 18 DIAM BLUE (SUTURE) ×3
SUT MERSILENE 4 0 P 3 (SUTURE) IMPLANT
SUT STEEL 3 0 (SUTURE) ×3 IMPLANT
SUT VIC AB 4-0 P-3 18XBRD (SUTURE) IMPLANT
SUT VIC AB 4-0 P2 18 (SUTURE) IMPLANT
SUT VIC AB 4-0 P3 18 (SUTURE)
SUTURE FIBERWR 2-0 18 17.9 3/8 (SUTURE) IMPLANT
SUTURE FIBERWR 4-0 18 DIA BLUE (SUTURE) ×1 IMPLANT
SYR BULB 3OZ (MISCELLANEOUS) ×3 IMPLANT
SYR CONTROL 10ML LL (SYRINGE) IMPLANT
TOWEL OR 17X24 6PK STRL BLUE (TOWEL DISPOSABLE) ×6 IMPLANT
TOWEL OR NON WOVEN STRL DISP B (DISPOSABLE) ×3 IMPLANT
UNDERPAD 30X30 (UNDERPADS AND DIAPERS) ×3 IMPLANT

## 2016-10-30 NOTE — H&P (Signed)
[name] is an 70 y.o. female.   Chief Complaint: right thumb pain HPI: Ms. [name] is a 70 year old female referred by Dr. Jeanie Cooks for consultation with shooting pain in her right hand in the basilar area of her thumb to a lesser extent her left side. She states that gripping causes significant pain for her. She states she is also dropping things, has been going on for at least a year. Her VAS score is 8/10 with use. She has used her wrap with some relief of pain. She states resting helps it. She has no history of diabetes, thyroid problems, arthritis or gout. There is family history of diabetes. No history of thyroid problems, arthritis or gout. She has been tested for diabetes. She has no history of injury to her neck. She has been taking Naprosyn and diclofenac, she states neither of which have fully relieve her symptoms. Dr. Jeanie Cooks notes are reviewed. She does not smoke. She has had shoulder surgery in the past.She complains of significant pain on her right side with a VAS score of 10/10. This at the basilar joint. She is not complaining of her left side. She had an injection done on her last visit on 03/05/2016. She states this lasted for approximately 1 week of relief. She states that she is not having any numbness or tingling. His alcohol pain. At the basilar area of her thumb. This even affects her driving.She has been using her splints          Past Medical History:  Diagnosis Date  . Anxiety   . Arthritis   . Hypertension     Past Surgical History:  Procedure Laterality Date  . ABDOMINAL HYSTERECTOMY    . BREAST SURGERY     cyst removal  . KIDNEY DONATION Left   . WRIST SURGERY     cyst removal    History reviewed. No pertinent family history. Social History:  reports that she has been smoking Cigarettes.  She has a 47.00 pack-year smoking history. She has never used smokeless tobacco. She reports that she does not drink alcohol or use drugs.  Allergies:  Allergies   Allergen Reactions  . Codeine Nausea And Vomiting  . Demerol [Meperidine]     No prescriptions prior to admission.    No results found for this or any previous visit (from the past 48 hour(s)).  No results found.   Pertinent items are noted in HPI.  Height 5\' 2"  (1.575 m), weight 74.4 kg (164 lb).  General appearance: alert, cooperative and appears stated age Head: Normocephalic, without obvious abnormality Neck: no JVD Resp: clear to auscultation bilaterally Cardio: regular rate and rhythm, S1, S2 normal, no murmur, click, rub or gallop GI: soft, non-tender; bowel sounds normal; no masses,  no organomegaly Extremities: pain base of right thumb Pulses: 2+ and symmetric Skin: Skin color, texture, turgor normal. No rashes or lesions Neurologic: Grossly normal Incision/Wound: na  Assessment/Plan  Assessment:  1. Primary osteoarthritis of both first carpometacarpal joints    Plan: She would like to have this surgically repaired. We have discussed suspension plasty LR TI with her. We have discussed the surgical intervention. Pre-peri-and postoperative course are discussed along with risks and complications. She is where there is no guarantee to the surgery the possibility of infection recurrence injury to arteries nerves tendons incomplete release symptoms and dystrophy. She is scheduled for suspension plasty right thumb as an outpatient under regional anesthesia.      Ailah Barna R 10/30/2016, 5:36 AM

## 2016-10-30 NOTE — Anesthesia Procedure Notes (Signed)
Procedure Name: LMA Insertion Date/Time: 10/30/2016 8:34 AM Performed by: Toula Moos L Pre-anesthesia Checklist: Patient identified, Emergency Drugs available, Suction available, Patient being monitored and Timeout performed Patient Re-evaluated:Patient Re-evaluated prior to inductionOxygen Delivery Method: Circle system utilized Preoxygenation: Pre-oxygenation with 100% oxygen Intubation Type: IV induction Ventilation: Mask ventilation without difficulty LMA: LMA inserted LMA Size: 4.0 Number of attempts: 1 Airway Equipment and Method: Bite block Placement Confirmation: positive ETCO2 Tube secured with: Tape Dental Injury: Teeth and Oropharynx as per pre-operative assessment

## 2016-10-30 NOTE — Brief Op Note (Signed)
10/30/2016  9:56 AM  PATIENT:  [name]  70 y.o. female  PRE-OPERATIVE DIAGNOSIS:  carpo metacarpal partrapezial right thumb  POST-OPERATIVE DIAGNOSIS:  carpo metacarpal partrapezial right thumb  PROCEDURE:  Procedure(s) with comments: SUSPENSION PLASTY abductor pollicis longus transfer excision trapezium right (Right) - axillary block  SUSPENSION PLASTY abductor pollicis longus transfer excision trapezium right  SURGEON:  Surgeon(s) and Role:    * Daryll Brod, MD - Primary  PHYSICIAN ASSISTANT:   ASSISTANTS:  Dasnoit, PAC   ANESTHESIA:   regional and IV sedation  EBL:  Total I/O In: 1700 [I.V.:1700] Out: 7 [Blood:7]  BLOOD ADMINISTERED:none  DRAINS: none   LOCAL MEDICATIONS USED:  NONE  SPECIMEN:  No Specimen  DISPOSITION OF SPECIMEN:  na  COUNTS:  YES  TOURNIQUET:   Total Tourniquet Time Documented: Upper Arm (Right) - 63 minutes Total: Upper Arm (Right) - 63 minutes   DICTATION: .Other Dictation: Dictation Number dictated 10/30/2016 @ 10.04  PLAN OF CARE: Discharge to home after PACU  PATIENT DISPOSITION:  PACU - hemodynamically stable.

## 2016-10-30 NOTE — Progress Notes (Signed)
Assisted Dr. Foster with right, ultrasound guided, supraclavicular block. Side rails up, monitors on throughout procedure. See vital signs in flow sheet. Tolerated Procedure well. °

## 2016-10-30 NOTE — Anesthesia Procedure Notes (Addendum)
Anesthesia Regional Block:  Supraclavicular block  Pre-Anesthetic Checklist: ,, timeout performed, Correct Patient, Correct Site, Correct Laterality, Correct Procedure, Correct Position, site marked, Risks and benefits discussed,  Surgical consent,  Pre-op evaluation,  At surgeon's request and post-op pain management  Laterality: Right  Prep: chloraprep       Needles:  Injection technique: Single-shot  Needle Type: Echogenic Stimulator Needle     Needle Length: 9cm 9 cm Needle Gauge: 21 and 21 G  Needle insertion depth: 4 cm   Additional Needles:  Procedures: ultrasound guided (picture in chart) Supraclavicular block Narrative:  Start time: 10/30/2016 8:09 AM End time: 10/30/2016 8:14 AM Injection made incrementally with aspirations every 5 mL.  Performed by: Personally  Anesthesiologist: Josephine Igo  Additional Notes: Relevant anatomy ID'd with [place]. Incremental 62ml injection with frequent aspiration. Patient tolerated procedure well.

## 2016-10-30 NOTE — Op Note (Signed)
:   Dictation Number dictated 10/30/2016 @ 10.04

## 2016-10-30 NOTE — Transfer of Care (Signed)
Immediate Anesthesia Transfer of Care Note  Patient: SHAVITA NEEL  Procedure(s) Performed: Procedure(s) with comments: SUSPENSION PLASTY abductor pollicis longus transfer excision trapezium right (Right) - axillary block  SUSPENSION PLASTY abductor pollicis longus transfer excision trapezium right  Patient Location: PACU  Anesthesia Type:GA combined with regional for post-op pain  Level of Consciousness: awake and patient cooperative  Airway & Oxygen Therapy: Patient Spontanous Breathing and Patient connected to face mask oxygen  Post-op Assessment: Report given to RN and Post -op Vital signs reviewed and stable  Post vital signs: Reviewed and stable  Last Vitals:  Vitals:   10/30/16 0815 10/30/16 0816  BP: 127/79   Pulse: 73 78  Resp: 20 16  Temp:      Last Pain:  Vitals:   10/30/16 0723  TempSrc: Oral  PainSc: 8       Patients Stated Pain Goal: 4 ([id number])  Complications: No apparent anesthesia complications

## 2016-10-30 NOTE — Anesthesia Preprocedure Evaluation (Addendum)
Anesthesia Evaluation  Patient identified by MRN, date of birth, ID band Patient awake    Reviewed: Allergy & Precautions, NPO status , Patient's Chart, lab work & pertinent test results  History of Anesthesia Complications (+) AWARENESS UNDER ANESTHESIA  Airway Mallampati: II  TM Distance: >3 FB Neck ROM: Full    Dental  (+) Poor Dentition, Caps,    Pulmonary Current Smoker,    Pulmonary exam normal        Cardiovascular hypertension, Pt. on medications Normal cardiovascular exam Rhythm:Regular Rate:Normal     Neuro/Psych Anxiety negative neurological ROS     GI/Hepatic Neg liver ROS, GERD  Medicated and Controlled,  Endo/Other  negative endocrine ROS  Renal/GU negative Renal ROS  negative genitourinary   Musculoskeletal  (+) Arthritis , Carpal metacarpal partrapezial right thumb   Abdominal Normal abdominal exam  (+)   Peds  Hematology negative hematology ROS (+)   Anesthesia Other Findings   Reproductive/Obstetrics                            Anesthesia Physical Anesthesia Plan  ASA: II  Anesthesia Plan: General and Regional   Post-op Pain Management:  Regional for Post-op pain   Induction:   Airway Management Planned: LMA  Additional Equipment:   Intra-op Plan:   Post-operative Plan: Extubation in OR  Informed Consent: I have reviewed the patients History and Physical, chart, labs and discussed the procedure including the risks, benefits and alternatives for the proposed anesthesia with the patient or authorized representative who has indicated his/her understanding and acceptance.   Dental advisory given  Plan Discussed with: Anesthesiologist, CRNA and Surgeon  Anesthesia Plan Comments:         Anesthesia Quick Evaluation

## 2016-10-30 NOTE — Discharge Instructions (Addendum)

## 2016-10-30 NOTE — Anesthesia Postprocedure Evaluation (Signed)
Anesthesia Post Note  Patient: [name]  Procedure(s) Performed: Procedure(s) (LRB): SUSPENSION PLASTY abductor pollicis longus transfer excision trapezium right (Right)  Patient location during evaluation: PACU Anesthesia Type: General and Regional Level of consciousness: awake and alert and oriented Pain management: pain level controlled Vital Signs Assessment: post-procedure vital signs reviewed and stable Respiratory status: spontaneous breathing, nonlabored ventilation and respiratory function stable Cardiovascular status: blood pressure returned to baseline and stable Postop Assessment: no signs of nausea or vomiting Anesthetic complications: no    Last Vitals:  Vitals:   10/30/16 1015 10/30/16 1030  BP: 134/78 134/70  Pulse: 78 71  Resp: 16 13  Temp:      Last Pain:  Vitals:   10/30/16 1030  TempSrc:   PainSc: 6                  Akiva Josey A.

## 2016-10-31 ENCOUNTER — Encounter (HOSPITAL_BASED_OUTPATIENT_CLINIC_OR_DEPARTMENT_OTHER): Payer: Self-pay | Admitting: Orthopedic Surgery

## 2016-10-31 NOTE — Op Note (Signed)
NAME:  [name], [name]               ACCOUNT NO.:  [id number]  MEDICAL RECORD NO.:  [id number]  LOCATION:                                 FACILITY:  PHYSICIAN:  Daryll Brod, M.D.            DATE OF BIRTH:  DATE OF PROCEDURE:  10/30/2016 DATE OF DISCHARGE:                              OPERATIVE REPORT   PREOPERATIVE DIAGNOSIS:  Pantrapezial arthritis, right thumb.  POSTOPERATIVE DIAGNOSIS:  Pantrapezial arthritis, right thumb.  OPERATION:  Excision of trapezium with abductor pollicis longus tendon transfer, "suspension plasty."  SURGEON:  Daryll Brod, MD.  ASSISTANT:  Marily Lente. Dasnoit, PA-C.  ANESTHESIA:  Supraclavicular block with sedation.  ANESTHESIOLOGIST:  Yvetta Coder, MD.  HISTORY:  The patient is a 70 year old female with a history of pain at the basilar joint of her right thumb, this is not responded to conservative treatment.  She has elected to undergo reconstruction of the carpometacarpal joint.  Pre, peri, and postoperative courses have been discussed along with risks and complications.  She is aware that there is no guarantee to the surgery; the possibility of infection; recurrence of injury to arteries, nerves, tendons; incomplete relief of symptoms; and dystrophy.  In the preoperative area, the patient is seen, the extremity marked by both patient and surgeon.  PROCEDURE IN DETAIL:  The patient was brought to the operating room, where a sedation was carried out in a supine position.  A supraclavicular block had been carried out in the preoperative area under the direction of the Anesthesia Department.  She was prepped using ChloraPrep in supine position with the right arm free.  A 3-minute dry time was allowed.  A time-out was taken confirming the patient and procedure.  The limb was exsanguinated with an Esmarch bandage. Tourniquet placed on the upper arm was inflated to 250 mmHg.  A curved incision was made over the base of the thumb metacarpal and then  carried along the abductor pollicis longus tendon.  This was carried down through subcutaneous tissue.  The radial sensory nerve was identified. The interval between the abductor pollicis longus and the extensor pollicis brevis was then opened.  The radial artery was identified proximally.  The carpometacarpal joint was then identified, a cyst was present, this was excised.  The dissection was carried proximally elevating periosteum from the trapezium to the STT joint.  The periosteum and capsule were then removed from the [facility] joint and the STT joint radially and ulnarly protecting the radial artery.  The interval between the trapezium and trapezoid was identified.  This was incised. The bone was then removed in a piecemeal fashion after it was unable to be removed as one solid piece.  This was done with rongeurs taking care to remove as much bone as possible from the palmar aspect.  The flexor carpi radialis tendon was identified and found to be intact.  The most dorsal __________ to the abductor pollicis longus was then isolated.  A separate incision was then made at the musculotendinous junction of the abductor pollicis longus.  This carried down through subcutaneous tissue.  The fascia overlying the muscle belly was identified.  [name]  tendon retriever was then passed through the first dorsal compartment and a monofilament wire was used to harvest the dorsal portion of the abductor pollicis longus attached to bone distally.  This was then transected at the musculotendinous junction.  This was irrigated, and this wound closed with interrupted 4-0 nylon sutures. The abductor pollicis longus tendon was delivered distally.  This was left attached to the base of the metacarpal of the thumb.  Drill hole was then made from the dorsal radial to ulnar palmar direction through the base of the thumb metacarpal.  A second hole was then drilled in the base of the first index metacarpal.  This  was done from a palmar to dorsal direction.  This was done just distal to the articular component of the second metacarpal for articulation with the first metacarpal. The abductor pollicis longus tendon was then passed through the base of the thumb in dorsal to palmar direction and then through the base of the index finger in a palmar to dorsal direction.  An incision was made to allow delivery of this dorsally.  A hemostat was then used to deliver the tendon back around the base of the insertion of the extensor carpi radialis longus and brought back into the defect of the trapezium.  The wound was copiously irrigated with saline.  The dorsal incision for transfer the tendon was closed with interrupted 4-0 nylon sutures.  The tendon was then weaved through the abductor pollicis longus transfer between the base of the first metacarpal and base of the second metacarpal.  This was sutured into position with multiple figure-of- eight 4-0 FiberWire sutures.  The remainder of the tendon was then formed __________ sutured to the tendon as it exited through the base of the first metacarpal.  The wound was again irrigated.  X-rays were taken confirming that the thumb was well suspended from the index finger.  The capsule was closed as much as possible with figure-of-eight 4-0 Vicryl sutures.  The subcutaneous tissue was closed with interrupted 4-0 Vicryl, and the skin was closed with interrupted 4-0 nylon sutures. Sterile compressive dressing, thumb spica splint dorsal palmar was applied.  On deflation of the tourniquet, all fingers immediately pinked.  She was taken to the recovery room for observation in satisfactory condition.  She will be discharged to home to return to the [facility] in 1 week, on Percocet.          ______________________________ Daryll Brod, M.D.     GK/MEDQ  D:  10/30/2016  T:  10/31/2016  Job:  [id number]

## 2016-11-05 DIAGNOSIS — M79644 Pain in right finger(s): Secondary | ICD-10-CM | POA: Insufficient documentation

## 2017-10-06 ENCOUNTER — Other Ambulatory Visit: Payer: Self-pay | Admitting: Internal Medicine

## 2017-10-06 DIAGNOSIS — Z1231 Encounter for screening mammogram for malignant neoplasm of breast: Secondary | ICD-10-CM

## 2017-11-05 ENCOUNTER — Ambulatory Visit: Payer: Medicare Other

## 2018-01-20 ENCOUNTER — Other Ambulatory Visit: Payer: Self-pay | Admitting: Internal Medicine

## 2018-01-20 DIAGNOSIS — N6320 Unspecified lump in the left breast, unspecified quadrant: Secondary | ICD-10-CM

## 2018-01-20 DIAGNOSIS — E2839 Other primary ovarian failure: Secondary | ICD-10-CM

## 2018-02-01 ENCOUNTER — Other Ambulatory Visit: Payer: Self-pay | Admitting: Internal Medicine

## 2018-02-01 ENCOUNTER — Ambulatory Visit
Admission: RE | Admit: 2018-02-01 | Discharge: 2018-02-01 | Disposition: A | Discharge status: Home / Self Care | Payer: Medicare Other | Source: Ambulatory Visit | Attending: Internal Medicine | Admitting: Internal Medicine

## 2018-02-01 ENCOUNTER — Ambulatory Visit
Admission: RE | Admit: 2018-02-01 | Discharge: 2018-02-01 | Disposition: A | Discharge status: Home / Self Care | Payer: Medicare HMO | Source: Ambulatory Visit | Attending: Internal Medicine | Admitting: Internal Medicine

## 2018-02-01 DIAGNOSIS — N6310 Unspecified lump in the right breast, unspecified quadrant: Secondary | ICD-10-CM

## 2018-02-01 DIAGNOSIS — N6320 Unspecified lump in the left breast, unspecified quadrant: Secondary | ICD-10-CM

## 2018-02-01 DIAGNOSIS — R599 Enlarged lymph nodes, unspecified: Secondary | ICD-10-CM

## 2018-02-03 ENCOUNTER — Ambulatory Visit
Admission: RE | Admit: 2018-02-03 | Discharge: 2018-02-03 | Disposition: A | Discharge status: Home / Self Care | Payer: Medicare HMO | Source: Ambulatory Visit | Attending: Internal Medicine | Admitting: Internal Medicine

## 2018-02-03 ENCOUNTER — Other Ambulatory Visit: Payer: Self-pay | Admitting: Internal Medicine

## 2018-02-03 DIAGNOSIS — N6320 Unspecified lump in the left breast, unspecified quadrant: Secondary | ICD-10-CM

## 2018-02-03 DIAGNOSIS — N6310 Unspecified lump in the right breast, unspecified quadrant: Secondary | ICD-10-CM

## 2018-02-03 DIAGNOSIS — R599 Enlarged lymph nodes, unspecified: Secondary | ICD-10-CM

## 2018-02-05 ENCOUNTER — Other Ambulatory Visit: Payer: Medicare Other

## 2018-02-05 ENCOUNTER — Telehealth: Payer: Self-pay | Admitting: Hematology and Oncology

## 2018-02-05 NOTE — Telephone Encounter (Signed)
Spoke with patient to confirm morning [facility] appointment for 3/13, packet will be mailed to patient

## 2018-02-08 ENCOUNTER — Encounter: Payer: Self-pay | Admitting: *Deleted

## 2018-02-09 ENCOUNTER — Other Ambulatory Visit: Payer: Self-pay | Admitting: *Deleted

## 2018-02-09 DIAGNOSIS — Z170 Estrogen receptor positive status [ER+]: Principal | ICD-10-CM

## 2018-02-09 DIAGNOSIS — C50212 Malignant neoplasm of upper-inner quadrant of left female breast: Secondary | ICD-10-CM

## 2018-02-10 ENCOUNTER — Encounter: Payer: Self-pay | Admitting: Physical Therapy

## 2018-02-10 ENCOUNTER — Ambulatory Visit: Payer: Self-pay | Admitting: Surgery

## 2018-02-10 ENCOUNTER — Encounter: Payer: Self-pay | Admitting: *Deleted

## 2018-02-10 ENCOUNTER — Encounter: Payer: Self-pay | Admitting: Radiation Oncology

## 2018-02-10 ENCOUNTER — Inpatient Hospital Stay: Payer: Medicare HMO

## 2018-02-10 ENCOUNTER — Encounter: Payer: Self-pay | Admitting: Hematology and Oncology

## 2018-02-10 ENCOUNTER — Inpatient Hospital Stay: Payer: Medicare HMO | Attending: Hematology and Oncology | Admitting: Hematology and Oncology

## 2018-02-10 ENCOUNTER — Ambulatory Visit: Payer: Medicare HMO | Attending: Surgery | Admitting: Physical Therapy

## 2018-02-10 ENCOUNTER — Other Ambulatory Visit: Payer: Self-pay | Admitting: *Deleted

## 2018-02-10 ENCOUNTER — Ambulatory Visit
Admission: RE | Admit: 2018-02-10 | Discharge: 2018-02-10 | Disposition: A | Discharge status: Home / Self Care | Payer: Medicare HMO | Source: Ambulatory Visit | Attending: Radiation Oncology | Admitting: Radiation Oncology

## 2018-02-10 ENCOUNTER — Other Ambulatory Visit: Payer: Self-pay

## 2018-02-10 VITALS — BP 154/76 | HR 86 | Temp 98.0°F | Resp 17 | Ht 62.0 in | Wt 146.6 lb

## 2018-02-10 DIAGNOSIS — F419 Anxiety disorder, unspecified: Secondary | ICD-10-CM | POA: Insufficient documentation

## 2018-02-10 DIAGNOSIS — Z170 Estrogen receptor positive status [ER+]: Secondary | ICD-10-CM | POA: Insufficient documentation

## 2018-02-10 DIAGNOSIS — F17210 Nicotine dependence, cigarettes, uncomplicated: Secondary | ICD-10-CM | POA: Diagnosis not present

## 2018-02-10 DIAGNOSIS — C50212 Malignant neoplasm of upper-inner quadrant of left female breast: Secondary | ICD-10-CM

## 2018-02-10 DIAGNOSIS — I10 Essential (primary) hypertension: Secondary | ICD-10-CM | POA: Insufficient documentation

## 2018-02-10 DIAGNOSIS — M129 Arthropathy, unspecified: Secondary | ICD-10-CM

## 2018-02-10 DIAGNOSIS — Z79899 Other long term (current) drug therapy: Secondary | ICD-10-CM | POA: Insufficient documentation

## 2018-02-10 DIAGNOSIS — Z800 Family history of malignant neoplasm of digestive organs: Secondary | ICD-10-CM | POA: Insufficient documentation

## 2018-02-10 DIAGNOSIS — Z5112 Encounter for antineoplastic immunotherapy: Secondary | ICD-10-CM | POA: Diagnosis not present

## 2018-02-10 DIAGNOSIS — R293 Abnormal posture: Secondary | ICD-10-CM

## 2018-02-10 LAB — CBC WITH DIFFERENTIAL (CANCER CENTER ONLY)
Basophils Absolute: 0.1 10*3/uL (ref 0.0–0.1)
Basophils Relative: 1 %
EOS ABS: 0.1 10*3/uL (ref 0.0–0.5)
Eosinophils Relative: 0 %
HEMATOCRIT: 40.8 % (ref 34.8–46.6)
HEMOGLOBIN: 13.4 g/dL (ref 11.6–15.9)
LYMPHS ABS: 2.9 10*3/uL (ref 0.9–3.3)
Lymphocytes Relative: 24 %
MCH: 28.3 pg (ref 25.1–34.0)
MCHC: 33 g/dL (ref 31.5–36.0)
MCV: 85.8 fL (ref 79.5–101.0)
Monocytes Absolute: 0.9 10*3/uL (ref 0.1–0.9)
Monocytes Relative: 7 %
NEUTROS ABS: 8.2 10*3/uL — AB (ref 1.5–6.5)
NEUTROS PCT: 68 %
Platelet Count: 395 10*3/uL (ref 145–400)
RBC: 4.76 MIL/uL (ref 3.70–5.45)
RDW: 13.6 % (ref 11.2–14.5)
WBC: 12.1 10*3/uL — AB (ref 3.9–10.3)

## 2018-02-10 LAB — CMP (CANCER CENTER ONLY)
ALBUMIN: 4.2 g/dL (ref 3.5–5.0)
ALK PHOS: 88 U/L (ref 40–150)
ALT: 11 U/L (ref 0–55)
AST: 17 U/L (ref 5–34)
Anion gap: 9 (ref 3–11)
BILIRUBIN TOTAL: 0.4 mg/dL (ref 0.2–1.2)
BUN: 17 mg/dL (ref 7–26)
CO2: 25 mmol/L (ref 22–29)
CREATININE: 0.95 mg/dL (ref 0.60–1.10)
Calcium: 9.9 mg/dL (ref 8.4–10.4)
Chloride: 104 mmol/L (ref 98–109)
GFR, Estimated: 60 mL/min — ABNORMAL LOW (ref >60)
GLUCOSE: 98 mg/dL (ref 70–140)
Potassium: 4.1 mmol/L (ref 3.5–5.1)
SODIUM: 138 mmol/L (ref 136–145)
Total Protein: 8 g/dL (ref 6.4–8.3)

## 2018-02-10 MED ORDER — BUPROPION HCL ER (SR) 150 MG PO TB12: ORAL_TABLET | ORAL | 2 refills | Status: DC

## 2018-02-10 MED ORDER — ONDANSETRON HCL 8 MG PO TABS: 8.0000 mg | ORAL_TABLET | Freq: Two times a day (BID) | ORAL | 1 refills | Status: DC | PRN

## 2018-02-10 MED ORDER — PROCHLORPERAZINE MALEATE 10 MG PO TABS: 10.0000 mg | ORAL_TABLET | Freq: Four times a day (QID) | ORAL | 1 refills | Status: DC | PRN

## 2018-02-10 MED ORDER — NICOTINE 14 MG/24HR TD PT24: 14.0000 mg | MEDICATED_PATCH | Freq: Every day | TRANSDERMAL | 0 refills | Status: DC

## 2018-02-10 MED ORDER — NICOTINE 7 MG/24HR TD PT24: 7.0000 mg | MEDICATED_PATCH | Freq: Every day | TRANSDERMAL | 0 refills | Status: DC

## 2018-02-10 MED ORDER — DEXAMETHASONE 4 MG PO TABS: 4.0000 mg | ORAL_TABLET | Freq: Every day | ORAL | 0 refills | Status: DC

## 2018-02-10 MED ORDER — LIDOCAINE-PRILOCAINE 2.5-2.5 % EX CREA: TOPICAL_CREAM | CUTANEOUS | 3 refills | Status: DC

## 2018-02-10 MED ORDER — LORAZEPAM 0.5 MG PO TABS: 0.5000 mg | ORAL_TABLET | Freq: Every evening | ORAL | 0 refills | Status: DC | PRN

## 2018-02-10 MED ORDER — NICOTINE 21 MG/24HR TD PT24: 21.0000 mg | MEDICATED_PATCH | Freq: Every day | TRANSDERMAL | 2 refills | Status: DC

## 2018-02-10 NOTE — Progress Notes (Signed)
Radiation Oncology         [phone] ________________________________  Initial Outpatient Consultation  Name: [name] MRN: [id number]  Date: 02/10/2018  DOB: [birth year]  IT:[id number], Christean Grief, MD  Erroll Luna, MD   REFERRING PHYSICIAN: Erroll Luna, MD  DIAGNOSIS:    ICD-10-CM   1. 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 female who self palpated a lump in the left breast. The patient has a history of a benign biopsy in the same region of the left breast in 2016. Mammogram on 02/01/18 showed a new suspicious palpable mass in the left breast with an adjacent indeterminate mass. Ultrasound showed a new solid mass in the left breast at 11:30, 4 cm from the nipple measuring 2.3 x 1.2 x 1.9 cm. Adjacent to this mass at 11 o'clock, 4 cm from the nipple is another hypoechoic mass measuring 6 x 6 x 5 mm. The two masses are 1.5 cm apart. There is a borderline node in the left axilla with a cortex measuring 4.3 mm. Biopsy on 02/03/18 showed invasive ductal carcinoma at the 11:30 mass with characteristics as described above in  the diagnosis. Receptor status is ER 30%, PR 0%, Ki67 30%, and Her2+. Biopsy of the 11 o'clock mass showed benign fibroadipose tissue. There was no evidence of carcinoma in the left axillary lymph node (0/1).  A right breast mass was biopsied showing fibroadenoma.  Patient is positive for glasses, poor circulation, shortness of breath with stairs, sleeping on 2 pillows, left breast lump, back pain, arthritis, headaches, numbness, anxiety, and blood transfusions.   Gynecologic History Age of first menstrual periods: 63 Still having periods: No Has used hormone replacement: No How many children carried to term: 1 Age of first live birth: 64 Trying to get pregnant: No Has used birth control or hormone shots: No  PREVIOUS RADIATION THERAPY: No  PAST MEDICAL HISTORY:  has a past medical history of Anxiety, Arthritis, and Hypertension.    PAST SURGICAL HISTORY: Past Surgical History:  Procedure Laterality Date  . ABDOMINAL HYSTERECTOMY    . BREAST BIOPSY Left 2016  . BREAST SURGERY     cyst removal  . CARPOMETACARPEL SUSPENSION PLASTY Right 10/30/2016   Procedure: SUSPENSION PLASTY abductor pollicis longus transfer excision trapezium right;  Surgeon: Daryll Brod, MD;  Location: [facility];  Service: Orthopedics;  Laterality: Right;  axillary block  SUSPENSION PLASTY abductor pollicis longus transfer excision trapezium right  . KIDNEY DONATION Left   . WRIST SURGERY     cyst removal    FAMILY HISTORY: family history includes Liver cancer in her mother; Lung  cancer in her father.  SOCIAL HISTORY:  reports that she has been smoking cigarettes.  She has a 47.00 pack-year smoking history. she has never used smokeless tobacco. She reports that she does not drink alcohol or use drugs.  ALLERGIES: Codeine and Demerol [meperidine]  MEDICATIONS:  Current Outpatient Medications  Medication Sig Dispense Refill  . amLODipine (NORVASC) 5 MG tablet Take 5 mg by mouth daily.    Marland Kitchen  buPROPion (WELLBUTRIN SR) 150 MG 12 hr tablet Start one week before quit date. Take 1 tab daily x 3 days, then 1 tab BID thereafter. 60 tablet 2  . dexamethasone (DECADRON) 4 MG tablet Take 1 tablet (4 mg total) by mouth daily. Take 1 tablet day before chemo and take 1 tablet day after chemo 12 tablet 0  . hydrochlorothiazide (HYDRODIURIL) 12.5 MG tablet     . hydrOXYzine (ATARAX/VISTARIL) 25 MG tablet Take 50 mg by mouth 3 (three) times daily as needed.     . latanoprost (XALATAN) 0.005 % ophthalmic solution     . lidocaine-prilocaine (EMLA) cream Apply to affected area once 30 g 3  . LORazepam (ATIVAN) 0.5 MG tablet Take 1 tablet (0.5 mg total) by mouth at bedtime as needed for sleep. 30 tablet 0  . nicotine (NICODERM CQ - DOSED IN MG/24 HOURS) 14 mg/24hr patch Place 1 patch (14 mg total) onto the skin daily. Apply 21 mg patch daily x 6 wk, then 43m patch daily x 2 wk, then 7 mg patch daily x 2 wk 14 patch 0  . nicotine (NICODERM CQ - DOSED IN MG/24 HOURS) 21 mg/24hr patch Place 1 patch (21 mg total) onto the skin daily. Apply 21 mg patch daily x 6 wk, then 18mpatch daily x 2 wk, then 7 mg patch daily x 2 wk 14 patch 2  . nicotine (NICODERM CQ - DOSED IN MG/24 HR) 7 mg/24hr patch Place 1 patch (7 mg total) onto the skin daily. Apply 21 mg patch daily x 6 wk, then 1456match daily x 2 wk, then 7 mg patch daily x 2 wk 14 patch 0  . omeprazole (PRILOSEC) 20 MG capsule     . ondansetron (ZOFRAN) 8 MG tablet Take 1 tablet (8 mg total) by mouth 2 (two) times daily as needed for refractory nausea / vomiting. 30 tablet 1  . oxyCODONE-acetaminophen (PERCOCET) 7.5-325 MG tablet Take by mouth.    . pramipexole (MIRAPEX) 0.25 MG tablet Take 0.25-1 mg by mouth at bedtime.    . prochlorperazine (COMPAZINE) 10 MG tablet Take 1 tablet (10 mg total) by mouth every 6 (six) hours as needed (Nausea or vomiting). 30 tablet 1  . traZODone (DESYREL) 100 MG tablet Take 100 mg by mouth every morning.     No current  facility-administered medications for this encounter.     REVIEW OF SYSTEMS: A 10+ POINT REVIEW OF SYSTEMS WAS OBTAINED including neurology, dermatology, psychiatry, cardiac, respiratory, lymph, extremities, GI, GU, Musculoskeletal, constitutional, breasts, reproductive, HEENT.  All pertinent positives are noted in the HPI.  All others are negative.   PHYSICAL EXAM:    General: Alert and oriented, in no acute distress HEENT: Head is normocephalic. Extraocular movements are intact. Oropharynx is clear. Neck: Neck is supple, no palpable cervical or supraclavicular lymphadenopathy. Heart: Regular in rate and rhythm with no murmurs, rubs, or gallops. Chest: Clear to auscultation bilaterally, with no rhonchi, wheezes, or rales. Abdomen: Soft, nontender, nondistended, with no rigidity or guarding. Extremities: No cyanosis or edema. Lymphatics:  see Neck Exam Skin: No concerning lesions. Musculoskeletal: symmetric strength and muscle tone throughout. Neurologic: Cranial nerves II through XII are grossly intact. No obvious focalities. Speech is fluent. Coordination is intact. Psychiatric: Judgment and insight are intact. Affect is appropriate. Breasts: Left breast at the 11:30 position the patient had a palpable mass about 2.5 cm in dimension. No other palpable masses appreciated in the breasts or axillae.   ECOG = 0  0 - Asymptomatic (Fully active, able to carry on all predisease activities without restriction)  1 - Symptomatic but completely ambulatory (Restricted in physically strenuous activity but ambulatory and able to carry out work of a light or sedentary nature. For example, light housework, office work)  2 - Symptomatic, <50% in bed during the day (Ambulatory and capable of all self care but unable to carry out any work activities. Up and about more than 50% of waking hours)  3 - Symptomatic, >50% in bed, but not bedbound (Capable of only limited self-care, confined to bed or chair 50%  or more of waking hours)  4 - Bedbound (Completely disabled. Cannot carry on any self-care. Totally confined to bed or chair)  5 - Death   Eustace Pen MM, Creech RH, Tormey DC, et al. [phone]). "Toxicity and response criteria of the [facility]". Elim Oncol. 5 (6): 649-55   LABORATORY DATA:  Lab Results  Component Value Date   WBC 12.1 (H) 02/10/2018   HGB 10.7 (L) 08/26/2009   HCT 40.8 02/10/2018   MCV 85.8 02/10/2018   PLT 395 02/10/2018   CMP     Component Value Date/Time   NA 138 02/10/2018 0810   K 4.1 02/10/2018 0810   CL 104 02/10/2018 0810   CO2 25 02/10/2018 0810   GLUCOSE 98 02/10/2018 0810   BUN 17 02/10/2018 0810   CREATININE 0.95 02/10/2018 0810   CALCIUM 9.9 02/10/2018 0810   PROT 8.0 02/10/2018 0810   ALBUMIN 4.2 02/10/2018 0810   AST 17 02/10/2018 0810   ALT 11 02/10/2018 0810   ALKPHOS 88 02/10/2018 0810   BILITOT 0.4 02/10/2018 0810   GFRNONAA 60 (L) 02/10/2018 0810   GFRAA >60 02/10/2018 0810         RADIOGRAPHY: US Breast Ltd Uni Left Inc Axilla  Result Date: 02/01/2018 CLINICAL DATA:  The patient had a benign biopsy in the left breast several years ago. She presents with a new lump in this region today. EXAM: DIGITAL DIAGNOSTIC BILATERAL MAMMOGRAM WITH CAD AND TOMO ULTRASOUND BILATERAL BREAST COMPARISON:  Previous exam(s). ACR Breast Density Category b: There are scattered areas of fibroglandular density. FINDINGS: There is a focal asymmetry in the medial superior right breast at a posterior depth measuring up to 10 mm, which was not seen on the previous study. No other abnormalities on the right. There is a new mass in the left breast located between 11 and 11 30. There are some calcifications along the anterior margin of this mass. No other suspicious mammographic changes. Mammographic images were processed with CAD. On physical exam, no suspicious lumps are identified. Targeted ultrasound is performed, showing no abnormalities on  the right. There is a new solid mass in the left breast at 11:30, 4 cm from the nipple measuring 2.3 x 1.2 x 1.9 cm. Adjacent to this mass at 11 o'clock, 4 cm from the nipple is another hypoechoic mass measuring 6 x 6 x 5 mm. These 2 masses are 1.5 cm apart. There is a borderline node  in the left axilla with a cortex measuring 4.3 mm. IMPRESSION: New suspicious palpable mass in the left breast. There is an adjacent indeterminate mass. Borderline lymph node in the left axilla. There is an indeterminate mass in the right breast located medially and superiorly, only seen mammographically. RECOMMENDATION: Recommend stereotactic biopsy of the right breast mass, sonographically guided biopsy of both of the left breast masses, an ultrasound-guided biopsy of the borderline lymph node. I have discussed the findings and recommendations with the patient. Results were also provided in writing at the conclusion of the visit. If applicable, a reminder letter will be sent to the patient regarding the next appointment. BI-RADS CATEGORY  4: Suspicious. Electronically Signed   By: Dorise Bullion III M.D   On: 02/01/2018 09:31   US Breast Ltd Uni Right Inc Axilla  Result Date: 02/01/2018 CLINICAL DATA:  The patient had a benign biopsy in the left breast several years ago. She presents with a new lump in this region today. EXAM: DIGITAL DIAGNOSTIC BILATERAL MAMMOGRAM WITH CAD AND TOMO ULTRASOUND BILATERAL BREAST COMPARISON:  Previous exam(s). ACR Breast Density Category b: There are scattered areas of fibroglandular density. FINDINGS: There is a focal asymmetry in the medial superior right breast at a posterior depth measuring up to 10 mm, which was not seen on the previous study. No other abnormalities on the right. There is a new mass in the left breast located between 11 and 11 30. There are some calcifications along the anterior margin of this mass. No other suspicious mammographic changes. Mammographic images were processed  with CAD. On physical exam, no suspicious lumps are identified. Targeted ultrasound is performed, showing no abnormalities on the right. There is a new solid mass in the left breast at 11:30, 4 cm from the nipple measuring 2.3 x 1.2 x 1.9 cm. Adjacent to this mass at 11 o'clock, 4 cm from the nipple is another hypoechoic mass measuring 6 x 6 x 5 mm. These 2 masses are 1.5 cm apart. There is a borderline node in the left axilla with a cortex measuring 4.3 mm. IMPRESSION: New suspicious palpable mass in the left breast. There is an adjacent indeterminate mass. Borderline lymph node in the left axilla. There is an indeterminate mass in the right breast located medially and superiorly, only seen mammographically. RECOMMENDATION: Recommend stereotactic biopsy of the right breast mass, sonographically guided biopsy of both of the left breast masses, an ultrasound-guided biopsy of the borderline lymph node. I have discussed the findings and recommendations with the patient. Results were also provided in writing at the conclusion of the visit. If applicable, a reminder letter will be sent to the patient regarding the next appointment. BI-RADS CATEGORY  4: Suspicious. Electronically Signed   By: Dorise Bullion III M.D   On: 02/01/2018 09:31   Mm Diag Breast Tomo Bilateral  Result Date: 02/01/2018 CLINICAL DATA:  The patient had a benign biopsy in the left breast several years ago. She presents with a new lump in this region today. EXAM: DIGITAL DIAGNOSTIC BILATERAL MAMMOGRAM WITH CAD AND TOMO ULTRASOUND BILATERAL BREAST COMPARISON:  Previous exam(s). ACR Breast Density Category b: There are scattered areas of fibroglandular density. FINDINGS: There is a focal asymmetry in the medial superior right breast at a posterior depth measuring up to 10 mm, which was not seen on the previous study. No other abnormalities on the right. There is a new mass in the left breast located between 11 and 11 30. There are some  calcifications along the anterior margin of this mass. No other suspicious mammographic changes. Mammographic images were processed with CAD. On physical exam, no suspicious lumps are identified. Targeted ultrasound is performed, showing no abnormalities on the right. There is a new solid mass in the left breast at 11:30, 4 cm from the nipple measuring 2.3 x 1.2 x 1.9 cm. Adjacent to this mass at 11 o'clock, 4 cm from the nipple is another hypoechoic mass measuring 6 x 6 x 5 mm. These 2 masses are 1.5 cm apart. There is a borderline node in the left axilla with a cortex measuring 4.3 mm. IMPRESSION: New suspicious palpable mass in the left breast. There is an adjacent indeterminate mass. Borderline lymph node in the left axilla. There is an indeterminate mass in the right breast located medially and superiorly, only seen mammographically. RECOMMENDATION: Recommend stereotactic biopsy of the right breast mass, sonographically guided biopsy of both of the left breast masses, an ultrasound-guided biopsy of the borderline lymph node. I have discussed the findings and recommendations with the patient. Results were also provided in writing at the conclusion of the visit. If applicable, a reminder letter will be sent to the patient regarding the next appointment. BI-RADS CATEGORY  4: Suspicious. Electronically Signed   By: Dorise Bullion III M.D   On: 02/01/2018 09:31   [place] Axillary Node Core Biopsy Left  Addendum Date: 02/04/2018   ADDENDUM REPORT: 02/04/2018 14:15 ADDENDUM: Pathology revealed GRADE III INVASIVE DUCTAL CARCINOMA of the Left breast, 11:30 o'clock. SCANT BENIGN FIBROADIPOSE TISSUE of the Left breast, 11:00 o'clock. THERE IS NO EVIDENCE OF CARCINOMA in the Left axillary lymph node. This was found to be concordant by Dr. Lajean Manes. Pathology revealed FIBROADENOMA of the Right breast, posterior, upper inner quadrant. This was found to be concordant by Dr. Lajean Manes. Pathology results were discussed  with the patient by telephone. The patient reported doing well after the biopsies with tenderness and bruising at the sites. Post biopsy instructions and care were reviewed and questions were answered. The patient was encouraged to call [facility] for any additional concerns. The patient was referred to [facility] on February 10, 2018. Pathology results reported by Terie Purser, RN on 02/04/2018. Electronically Signed   By: Lajean Manes M.D.   On: 02/04/2018 14:15   Result Date: 02/04/2018 CLINICAL DATA:  Patient presents for ultrasound-guided core needle biopsy a dominant, 11:30 o'clock position, left breast mass, and adjacent, small, 11 o'clock position mass, and a left axillary lymph node with a thickened cortex. EXAM: ULTRASOUND GUIDED LEFT BREAST CORE NEEDLE BIOPSY ULTRASOUND GUIDED LEFT AXILLA CORE NEEDLE BIOPSY COMPARISON:  Previous exam(s). FINDINGS: I met with the patient and we discussed the procedure of ultrasound-guided biopsy, including benefits and alternatives. We discussed the high likelihood of a successful procedure. We discussed the risks of the procedure, including infection, bleeding, tissue injury, clip migration, and inadequate sampling. Informed written consent was given. The usual time-out protocol was performed immediately prior to the procedure. Lesion quadrant: Upper outer quadrant Using sterile technique and 1% Lidocaine as local anesthetic, under direct ultrasound visualization, a 12 gauge spring-loaded device was used to perform biopsy of the 11:30 o'clock position mass using an inferior approach. At the conclusion of the procedure a ribbon shaped tissue marker clip was deployed into the biopsy cavity. Follow up 2 view mammogram was performed and dictated separately. Lesion quadrant: Upper outer quadrant Using  sterile technique and 1% Lidocaine as local anesthetic, under direct  ultrasound visualization, a 12 gauge spring-loaded device was used to perform biopsy of the 11 o'clock position mass using an inferior approach. At the conclusion of the procedure a heart shaped tissue marker clip was deployed into the biopsy cavity. Follow up 2 view mammogram was performed and dictated separately. Using sterile technique and 1% Lidocaine as local anesthetic, under direct ultrasound visualization, a 14 gauge spring-loaded device was used to perform biopsy of the left axillary lymph node with the thickened cortex using an inferior approach. At the conclusion of the procedure a spiral shaped HydroMARK tissue marker clip was deployed into the biopsy cavity. Follow up 2 view mammogram was performed and dictated separately. IMPRESSION: Ultrasound guided biopsy of 2 left breast masses and a left axillary lymph node. No apparent complications. Electronically Signed: By: Lajean Manes M.D. On: 02/03/2018 11:47   Mm Clip Placement Left  Result Date: 02/03/2018 CLINICAL DATA:  Status post ultrasound-guided core needle biopsy of 2 adjacent upper inner quadrant left breast masses, 11:30 o'clock and 11 o'clock, and a left axillary lymph node. Status post stereotactic core needle biopsy of a focal asymmetry the posterior upper inner quadrant of the right breast. EXAM: DIAGNOSTIC LEFT MAMMOGRAM POST ULTRASOUND BIOPSY DIAGNOSTIC RIGHT MAMMOGRAM POST STEREOTACTIC BIOPSY COMPARISON:  Previous exam(s). FINDINGS: Mammographic images were obtained following ultrasound guided biopsy of 2 adjacent masses in the upper inner quadrant of the left breast as well as a left axillary lymph node. The ribbon shaped biopsy clip lies within the dominant, 11:30 o'clock position, mass. The heart shaped clip lies 8 mm medial to the second mass the CC view, projecting near the expected location of the mass the mL view. [facility] spiral shaped biopsy clip is partly visualized along the inferior margin of a left axillary lymph  node. Mammographic images were obtained following stereotactic guided biopsy of a focal asymmetry in the posterior upper inner right breast. The coil shaped biopsy clip lies in the expected location of the asymmetry, which is not well-defined post biopsy. IMPRESSION: 1. Ribbon shaped biopsy clip within the dominant, 11:30 o'clock position mass is well positioned. 2. Heart shaped biopsy clip within the smaller adjacent, 11 o'clock position, mass projects 8 mm medial to this mass on the mammographic CC view. 3. HydroMARK, spiral shaped biopsy clip lies along the inferior margin a left axillary lymph node. 4. Coil shaped biopsy clip lies in the area of the focal asymmetry within the posterior upper inner right breast. Final Assessment: Post Procedure Mammograms for Marker Placement Electronically Signed   By: Lajean Manes M.D.   On: 02/03/2018 12:22   Mm Clip Placement Right  Result Date: 02/03/2018 CLINICAL DATA:  Status post ultrasound-guided core needle biopsy of 2 adjacent upper inner quadrant left breast masses, 11:30 o'clock and 11 o'clock, and a left axillary lymph node. Status post stereotactic core needle biopsy of a focal asymmetry the posterior upper inner quadrant of the right breast. EXAM: DIAGNOSTIC LEFT MAMMOGRAM POST ULTRASOUND BIOPSY DIAGNOSTIC RIGHT MAMMOGRAM POST STEREOTACTIC BIOPSY COMPARISON:  Previous exam(s). FINDINGS: Mammographic images were obtained following ultrasound guided biopsy of 2 adjacent masses in the upper inner quadrant of the left breast as well as a left axillary lymph node. The ribbon shaped biopsy clip lies within the dominant, 11:30 o'clock position, mass. The heart shaped clip lies 8 mm medial to the second mass the CC view, projecting near the expected location of the mass the mL view.  [facility] spiral shaped biopsy clip is partly visualized along the inferior margin of a left axillary lymph node. Mammographic images were obtained following stereotactic guided biopsy  of a focal asymmetry in the posterior upper inner right breast. The coil shaped biopsy clip lies in the expected location of the asymmetry, which is not well-defined post biopsy. IMPRESSION: 1. Ribbon shaped biopsy clip within the dominant, 11:30 o'clock position mass is well positioned. 2. Heart shaped biopsy clip within the smaller adjacent, 11 o'clock position, mass projects 8 mm medial to this mass on the mammographic CC view. 3. HydroMARK, spiral shaped biopsy clip lies along the inferior margin a left axillary lymph node. 4. Coil shaped biopsy clip lies in the area of the focal asymmetry within the posterior upper inner right breast. Final Assessment: Post Procedure Mammograms for Marker Placement Electronically Signed   By: Lajean Manes M.D.   On: 02/03/2018 12:22   [place] Lt Breast Bx W Loc Dev 1st Lesion Img Bx Spec US Guide  Addendum Date: 02/04/2018   ADDENDUM REPORT: 02/04/2018 14:15 ADDENDUM: Pathology revealed GRADE III INVASIVE DUCTAL CARCINOMA of the Left breast, 11:30 o'clock. SCANT BENIGN FIBROADIPOSE TISSUE of the Left breast, 11:00 o'clock. THERE IS NO EVIDENCE OF CARCINOMA in the Left axillary lymph node. This was found to be concordant by Dr. Lajean Manes. Pathology revealed FIBROADENOMA of the Right breast, posterior, upper inner quadrant. This was found to be concordant by Dr. Lajean Manes. Pathology results were discussed with the patient by telephone. The patient reported doing well after the biopsies with tenderness and bruising at the sites. Post biopsy instructions and care were reviewed and questions were answered. The patient was encouraged to call [facility] for any additional concerns. The patient was referred to [facility] on February 10, 2018. Pathology results reported by Terie Purser, RN on 02/04/2018. Electronically Signed   By: Lajean Manes M.D.   On: 02/04/2018 14:15    Result Date: 02/04/2018 CLINICAL DATA:  Patient presents for ultrasound-guided core needle biopsy a dominant, 11:30 o'clock position, left breast mass, and adjacent, small, 11 o'clock position mass, and a left axillary lymph node with a thickened cortex. EXAM: ULTRASOUND GUIDED LEFT BREAST CORE NEEDLE BIOPSY ULTRASOUND GUIDED LEFT AXILLA CORE NEEDLE BIOPSY COMPARISON:  Previous exam(s). FINDINGS: I met with the patient and we discussed the procedure of ultrasound-guided biopsy, including benefits and alternatives. We discussed the high likelihood of a successful procedure. We discussed the risks of the procedure, including infection, bleeding, tissue injury, clip migration, and inadequate sampling. Informed written consent was given. The usual time-out protocol was performed immediately prior to the procedure. Lesion quadrant: Upper outer quadrant Using sterile technique and 1% Lidocaine as local anesthetic, under direct ultrasound visualization, a 12 gauge spring-loaded device was used to perform biopsy of the 11:30 o'clock position mass using an inferior approach. At the conclusion of the procedure a ribbon shaped tissue marker clip was deployed into the biopsy cavity. Follow up 2 view mammogram was performed and dictated separately. Lesion quadrant: Upper outer quadrant Using sterile technique and 1% Lidocaine as local anesthetic, under direct ultrasound visualization, a 12 gauge spring-loaded device was used to perform biopsy of the 11 o'clock position mass using an inferior approach. At the conclusion of the procedure a heart shaped tissue marker clip was deployed into the biopsy cavity. Follow up 2 view mammogram was performed and dictated separately. Using sterile technique  and 1% Lidocaine as local anesthetic, under direct ultrasound visualization, a 14 gauge spring-loaded device was used to perform biopsy of the left axillary lymph node with the thickened cortex using an inferior approach. At the  conclusion of the procedure a spiral shaped HydroMARK tissue marker clip was deployed into the biopsy cavity. Follow up 2 view mammogram was performed and dictated separately. IMPRESSION: Ultrasound guided biopsy of 2 left breast masses and a left axillary lymph node. No apparent complications. Electronically Signed: By: Lajean Manes M.D. 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 SCANT BENIGN FIBROADIPOSE TISSUE of the Left breast, 11:00 o'clock. THERE IS NO EVIDENCE OF CARCINOMA in the Left axillary lymph node. This was found to be concordant by Dr. Lajean Manes. Pathology revealed FIBROADENOMA of the Right breast, posterior, upper inner quadrant. This was found to be concordant by Dr. Lajean Manes. Pathology results were discussed with the patient by telephone. The patient reported doing well after the biopsies with tenderness and bruising at the sites. Post biopsy instructions and care were reviewed and questions were answered. The patient was encouraged to call [facility] for any additional concerns. The patient was referred to [facility] on February 10, 2018. Pathology results reported by Terie Purser, RN on 02/04/2018. Electronically Signed   By: Lajean Manes M.D.   On: 02/04/2018 14:15   Result Date: 02/04/2018 CLINICAL DATA:  Patient presents for ultrasound-guided core needle biopsy a dominant, 11:30 o'clock position, left breast mass, and adjacent, small, 11 o'clock position mass, and a left axillary lymph node with a thickened cortex. EXAM: ULTRASOUND GUIDED LEFT BREAST CORE NEEDLE BIOPSY ULTRASOUND GUIDED LEFT AXILLA CORE NEEDLE BIOPSY COMPARISON:  Previous exam(s). FINDINGS: I met with the patient  and we discussed the procedure of ultrasound-guided biopsy, including benefits and alternatives. We discussed the high likelihood of a successful procedure. We discussed the risks of the procedure, including infection, bleeding, tissue injury, clip migration, and inadequate sampling. Informed written consent was given. The usual time-out protocol was performed immediately prior to the procedure. Lesion quadrant: Upper outer quadrant Using sterile technique and 1% Lidocaine as local anesthetic, under direct ultrasound visualization, a 12 gauge spring-loaded device was used to perform biopsy of the 11:30 o'clock position mass using an inferior approach. At the conclusion of the procedure a ribbon shaped tissue marker clip was deployed into the biopsy cavity. Follow up 2 view mammogram was performed and dictated separately. Lesion quadrant: Upper outer quadrant Using sterile technique and 1% Lidocaine as local anesthetic, under direct ultrasound visualization, a 12 gauge spring-loaded device was used to perform biopsy of the 11 o'clock position mass using an inferior approach. At the conclusion of the procedure a heart shaped tissue marker clip was deployed into the biopsy cavity. Follow up 2 view mammogram was performed and dictated separately. Using sterile technique and 1% Lidocaine as local anesthetic, under direct ultrasound visualization, a 14 gauge spring-loaded device was used to perform biopsy of the left axillary lymph node with the thickened cortex using an inferior approach. At the conclusion of the procedure a spiral shaped HydroMARK tissue marker clip was deployed into the biopsy cavity. Follow up 2 view mammogram was performed and dictated separately. IMPRESSION:  Ultrasound guided biopsy of 2 left breast masses and a left axillary lymph node. No apparent complications. Electronically Signed: By: Lajean Manes M.D. 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 SCANT BENIGN FIBROADIPOSE TISSUE of the Left breast, 11:00 o'clock. THERE IS NO EVIDENCE OF CARCINOMA in the Left axillary lymph node. This was found to be concordant by Dr. Lajean Manes. Pathology revealed FIBROADENOMA of the Right breast, posterior, upper inner quadrant. This was found to be concordant by Dr. Lajean Manes. Pathology results were discussed with the patient by telephone. The patient reported doing well after the biopsies with tenderness and bruising at the sites. Post biopsy instructions and care were reviewed and questions were answered. The patient was encouraged to call [facility] for any additional concerns. The patient was referred to [facility] on February 10, 2018. Pathology results reported by Terie Purser, RN on 02/04/2018. Electronically Signed   By: Lajean Manes M.D.   On: 02/04/2018 14:14   Result Date: 02/04/2018 CLINICAL DATA:  Patient presents for stereotactic core needle biopsy of a right breast mass, which appears to be more of a focal asymmetry during stereotactic positioning, following ultrasound-guided core needle biopsy of 2 left breast masses and a left axillary lymph node. EXAM: RIGHT BREAST STEREOTACTIC CORE NEEDLE BIOPSY COMPARISON:  Previous exams. FINDINGS: The patient and I discussed the procedure of stereotactic-guided biopsy including benefits and alternatives. We discussed the high likelihood of a successful procedure. We discussed the risks of the procedure including infection, bleeding, tissue injury, clip migration, and inadequate sampling. Informed written consent was given. The usual time out protocol was performed immediately prior to the procedure. Using sterile technique and 1% Lidocaine as  local anesthetic, under stereotactic guidance, a 9 gauge vacuum assisted device was used to perform core needle biopsy of the focal asymmetry in the posterior upper inner quadrant of the right breast using a superior approach. Lesion quadrant: Upper inner quadrant At the conclusion of the procedure, a coil shaped tissue marker clip was deployed into the biopsy cavity. Follow-up 2-view mammogram was performed and dictated separately. IMPRESSION: Stereotactic-guided biopsy of the right breast. No apparent complications. Electronically Signed: By: Lajean Manes M.D. 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  The patient will be scheduled for MRI, genetic testing (02/11/18), chemo class, echocardiogram, and port placement. She is a good candidate for breast conservation. She will proceed withneoadjuvant chemotherapy.Then left lumpectomy with sentinel lymph node biopsy. This will be followed by    by radiation therapy. She will be placed on hormone replacement for at least 5 years.   She has been discussed at our multidisciplinary tumor board.  The consensus is that she would be a good candidate for breast conservation. I talked to her about the option of a mastectomy and informed her that her expected overall survival would be equivalent between mastectomy and breast conservation, based upon randomized controlled data. She is enthusiastic about breast conservation.  It was a pleasure meeting the patient today. We discussed the risks, benefits, and side effects of radiotherapy. I recommend radiotherapy to the left breast  to reduce her risk of locoregional recurrence by 2/3.  We discussed that radiation would take approximately 4 weeks to complete and that I would give the patient a few weeks to heal following surgery before starting treatment planning.  We spoke about acute effects including skin irritation and  fatigue as well as much less common late effects including internal organ injury or irritation. We spoke about the latest technology that is used to minimize the risk of late effects for patients undergoing radiotherapy to the breast or chest wall. No guarantees of treatment were given. The patient is enthusiastic about proceeding with treatment. I look forward to participating in the patient's care.  I will await her referral back to me for postoperative follow-up and eventual CT simulation/treatment planning.  I asked the patient today about tobacco use. The patient uses tobacco.  I advised the patient to quit. Services were offered by me today including outpatient counseling and pharmacotherapy. I assessed for the willingness to attempt to quit and provided encouragement and demonstrated willingness to make referrals and/or prescriptions to help the patient attempt to quit. The patient has follow-up with the oncologic team to touch base on their tobacco use and /or cessation efforts.  Over 5 minutes were spent on this issue. She will start Rxs I am making for Wellbutrin and nicotine patches.  I also gave her info in [place] and the outpatient counseling program.   __________________________________________   Eppie Gibson, MD   This document serves as a record of services personally performed by Eppie Gibson, MD. It was created on her behalf by Bethann Humble, a trained medical scribe. The creation of this record is based on the scribe's personal observations and the provider's statements to them. This document has been checked and approved by the attending provider.

## 2018-02-10 NOTE — Assessment & Plan Note (Signed)
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, copy #16.3, axillary lymph node biopsy benign concordant, T2 N0 stage II a clinical stage AJCC 8  Pathology and radiology counseling: Discussed with the patient, the details of pathology including the type of breast cancer,the clinical staging, the significance of ER, PR and HER-2/neu receptors and the implications for treatment. After reviewing the pathology in detail, we proceeded to discuss the different treatment options between surgery, radiation, chemotherapy, antiestrogen therapies.  Treatment plan: 1.  Neoadjuvant chemotherapy with TCH Perjeta x6 cycles followed by Herceptin Perjeta maintenance for 1 year 2. followed by breast conserving surgery 3 followed by adjuvant radiation 4.  Followed by adjuvant antiestrogen therapy with letrozole for 5-7 years ------------------------------------------------------------------------------------------------------------------------ Chemotherapy Counseling: I discussed the risks and benefits of chemotherapy including the risks of nausea/ vomiting, risk of infection from low WBC count, fatigue due to chemo or anemia, bruising or bleeding due to low platelets, mouth sores, loss/ change in taste and decreased appetite. Liver and kidney function will be monitored through out chemotherapy as abnormalities in liver and kidney function may be a side effect of treatment. Cardiac dysfunction due to Herceptin and Perjeta were discussed in detail. Risk of permanent bone marrow dysfunction and leukemia due to chemo were also discussed.  Plan: 1. Port placement 2. Echocardiogram 3. Chemotherapy class 4. Start chemotherapy in 2 weeks  UPBEAT clinical trial ([id number]): Newly diagnosed stage I to III breast cancer patients receiving either adjuvant or neoadjuvant chemotherapy undergo  cardiac MRI before treatment and at 24 months along with neurocognitive testing, exercise and disability measures at baseline 3, 12 and 24 months.  Return to clinic to start chemotherapy

## 2018-02-10 NOTE — Patient Instructions (Signed)

## 2018-02-10 NOTE — H&P (Signed)
[name] Documented: 02/10/2018 7:26 AM Location: [facility] Patient #: [id number] DOB: 14-Mar-1946 Undefined / Language: [name] / Race: White Female  History of Present Illness Michelle Moores A. Connery Shiffler MD; 02/10/2018 10:58 AM) Patient words: Pt sent at the request of Dr [name] for left breast mass times 1 month. Pt noted a mass to her left breast at 11 oclock. 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 No nipple discharge or other complaints.        CLINICAL DATA: The patient had a benign biopsy in the left breast several years ago. She presents with a new lump in this region today.  EXAM: DIGITAL DIAGNOSTIC BILATERAL MAMMOGRAM WITH CAD AND TOMO  ULTRASOUND BILATERAL BREAST  COMPARISON: Previous exam(s).  ACR Breast Density Category Bradshaw: There are scattered areas of fibroglandular density.  FINDINGS: There is a focal asymmetry in the medial superior right breast at a posterior depth measuring up to 10 mm, which was not seen on the previous study. No other abnormalities on the right. There is a new mass in the left breast located between 11 and 11 30. There are some calcifications along the anterior margin of this mass. No other suspicious mammographic changes.  Mammographic images were processed with CAD.  On physical exam, no suspicious lumps are identified.  Targeted ultrasound is performed, showing no abnormalities on the right. There is a new solid mass in the left breast at 11:30, 4 cm from the nipple measuring 2.3 x 1.2 x 1.9 cm. Adjacent to this mass at 11 o'clock, 4 cm from the nipple is another hypoechoic mass measuring 6 x 6 x 5 mm. These 2 masses are 1.5 cm apart. There is a borderline node in the left axilla with a cortex measuring 4.3 mm.  IMPRESSION: New suspicious palpable mass in the left breast. There is an adjacent indeterminate mass. Borderline lymph node in the left axilla. There is an indeterminate mass in  the right breast located medially and superiorly, only seen mammographically.  RECOMMENDATION: Recommend stereotactic biopsy of the right breast mass, sonographically guided biopsy of both of the left breast masses, an ultrasound-guided biopsy of the borderline lymph node.  I have discussed the findings and recommendations with the patient. Results were also provided in writing at the conclusion of the visit. If applicable, a reminder letter will be sent to the patient regarding the next appointment.  BI-RADS CATEGORY 4: Suspicious.   Electronically Signed By: [name] M.D On: 02/01/2018 09:31              ADDITIONAL INFORMATION: 1. PROGNOSTIC INDICATORS Results: IMMUNOHISTOCHEMICAL AND MORPHOMETRIC ANALYSIS PERFORMED MANUALLY Estrogen Receptor: 30%, POSITIVE, MODERATE-WEAK STAINING INTENSITY Progesterone Receptor: 0%, NEGATIVE Proliferation Marker Ki67: 30% COMMENT: The negative hormone receptor study(ies) in this case has no internal positive control. REFERENCE RANGE ESTROGEN RECEPTOR NEGATIVE 0% POSITIVE =>1% REFERENCE RANGE PROGESTERONE RECEPTOR NEGATIVE 0% POSITIVE =>1% All controls stained appropriately Michelle Sheller MD Pathologist, Electronic Signature ( Signed 02/09/2018) 1. FLUORESCENCE IN-SITU HYBRIDIZATION Results: HER2 - **POSITIVE** RATIO OF HER2/CEP17 SIGNALS 5.62 AVERAGE HER2 COPY NUMBER PER CELL 16.30 Reference Range: 1 of 3 FINAL for [name] ([id number]) ADDITIONAL INFORMATION:(continued) NEGATIVE HER2/CEP17 Ratio <2.0 and average HER2 copy number <4.0 EQUIVOCAL HER2/CEP17 Ratio <2.0 and average HER2 copy number 4.0 and <6.0 POSITIVE HER2/CEP17 Ratio >=2.0 or <2.0 and average HER2 copy number >=6.0 Michelle Males MD Pathologist, Electronic Signature ( Signed 02/10/2018) FINAL DIAGNOSIS Diagnosis 1. Breast, left, needle  core biopsy, 11:30 o'clock - INVASIVE DUCTAL CARCINOMA. - SEE COMMENT. 2. Breast, left, needle  core biopsy, 11:00 o'clock - SCANT BENIGN FIBROADIPOSE TISSUE. - THERE IS NO EVIDENCE OF MALIGNANCY. 3. Lymph node, needle/core biopsy, left axilla - THERE IS NO EVIDENCE OF CARCINOMA IN 1 OF 1 LYMPH NODE (0/1). 4. Breast, right, needle core biopsy, posterior UIQ - FIBROADENOMA. - THERE IS NO EVIDENCE OF MALIGNANCY. Microscopic Comment 1. The carcinoma appears grade Bradshaw. A breast prognostic.  The patient is a 72 year old female.   Past Surgical History [name], RN; 02/10/2018 7:26 AM) Appendectomy Breast Biopsy Bilateral. multiple Cataract Surgery Bilateral. Colon Polyp Removal - Colonoscopy Foot Surgery Bilateral. Hysterectomy (due to cancer) - Complete Nephrectomy Left. Oral Surgery Shoulder Surgery Right. Tonsillectomy  Diagnostic Studies History [name], RN; 02/10/2018 7:26 AM) Colonoscopy 1-5 years ago Mammogram within last year Pap Smear >5 years ago  Medication History [name], RN; 02/10/2018 7:27 AM) Medications Reconciled  Social History [name], RN; 02/10/2018 7:26 AM) Caffeine use Carbonated beverages, Coffee, Tea. No alcohol use Tobacco use Current some day smoker.  Family History [name], RN; 02/10/2018 7:26 AM) Alcohol Abuse Brother, Mother. Arthritis Brother, Sister. Breast Cancer Family Members In General. Cerebrovascular Accident Brother, Father. Cervical Cancer Family Members In General. Hypertension Brother, Sister. Kidney Disease Brother. Migraine Headache Son. Respiratory Condition Father.  Pregnancy / Birth History [name], RN; 02/10/2018 7:26 AM) Age at menarche 27 years. Age of menopause <47 Contraceptive History Oral contraceptives. Gravida 2 Irregular periods Maternal age 62-20 Para 1  Other Problems [name], RN; 02/10/2018 7:26 AM) Anxiety Disorder Arthritis Breast Cancer Chest pain Diverticulosis Gastroesophageal Reflux Disease General  anesthesia - complications Hepatitis High blood pressure Lump In Breast Oophorectomy Bilateral. Transfusion history     Review of Systems [name] RN; 02/10/2018 7:26 AM) General Present- Fatigue and Night Sweats. Not Present- Appetite Loss, Chills, Fever, Weight Gain and Weight Loss. Skin Present- Dryness. Not Present- Change in Wart/Mole, Hives, Jaundice, New Lesions, Non-Healing Wounds, Rash and Ulcer. HEENT Present- Wears glasses/contact lenses. Not Present- Earache, Hearing Loss, Hoarseness, Nose Bleed, Oral Ulcers, Ringing in the Ears, Seasonal Allergies, Sinus Pain, Sore Throat, Visual Disturbances and Yellow Eyes. Respiratory Not Present- Bloody sputum, Chronic Cough, Difficulty Breathing, Snoring and Wheezing. Breast Present- Breast Mass and Breast Pain. Not Present- Nipple Discharge and Skin Changes. Cardiovascular Not Present- Chest Pain, Difficulty Breathing Lying Down, Leg Cramps, Palpitations, Rapid Heart Rate, Shortness of Breath and Swelling of Extremities. Gastrointestinal Present- Gets full quickly at meals and Indigestion. Not Present- Abdominal Pain, Bloating, Bloody Stool, Change in Bowel Habits, Chronic diarrhea, Constipation, Difficulty Swallowing, Excessive gas, Hemorrhoids, Nausea, Rectal Pain and Vomiting. Neurological Present- Numbness and Tingling. Not Present- Decreased Memory, Fainting, Headaches, Seizures, Tremor, Trouble walking and Weakness. Psychiatric Present- Anxiety. Not Present- Bipolar, Change in Sleep Pattern, Depression, Fearful and Frequent crying. Endocrine Present- Hot flashes. Not Present- Cold Intolerance, Excessive Hunger, Hair Changes, Heat Intolerance and New Diabetes.   Physical Exam (Rachel Samples A. Lesette Frary MD; 02/10/2018 10:59 AM)  General Mental Status-Alert. General Appearance-Consistent with stated age. Hydration-Well hydrated. Voice-Normal.  Head and Neck Head-normocephalic, atraumatic with no lesions or palpable  masses. Trachea-midline. Thyroid Gland Characteristics - normal size and consistency.  Chest and Lung Exam Chest and lung exam reveals -quiet, even and easy respiratory effort with no use of accessory muscles and on auscultation, normal breath sounds, no adventitious sounds and normal vocal resonance. Inspection Chest Wall - Normal. Back - normal.  Breast Breast -  Left-Symmetric, Non Tender, No Biopsy scars, no Dimpling, No Inflammation, No Lumpectomy scars, No Mastectomy scars, No Peau d' Orange. Breast - Right-Symmetric, Non Tender, No Biopsy scars, no Dimpling, No Inflammation, No Lumpectomy scars, No Mastectomy scars, No Peau d' Orange. Breast Lump-No Palpable Breast Mass.  Cardiovascular Cardiovascular examination reveals -normal heart sounds, regular rate and rhythm with no murmurs and normal pedal pulses bilaterally.  Neurologic Neurologic evaluation reveals -alert and oriented x 3 with no impairment of recent or remote memory. Mental Status-Normal.  Lymphatic Head & Neck  General Head & Neck Lymphatics: Bilateral - Description - Normal. Axillary  General Axillary Region: Bilateral - Description - Normal. Tenderness - Non Tender.    Assessment & Plan (Tuere Nwosu A. Eve Rey MD; 02/10/2018 11:00 AM)  BREAST CANCER, LEFT (C50.912) Impression: stage 2 her 2 neu positive neoadjuvant chemotherapy needs port placement breast conservation down the road. Pt requires port placement for chemotherapy. Risk include bleeding, infection, pneumothorax, hemothorax, mediastinal injury, nerve injury , blood vessel injury, strke, blood clots, death, migration. embolization and need for additional procedures. Pt agrees to proceed.  Current Plans Use of a central venous catheter for intravenous therapy was discussed. Technique of catheter placement using ultrasound and fluoroscopy guidance was discussed. Risks such as bleeding, infection, pneumothorax, catheter occlusion,  reoperation, and other risks were discussed. I noted a good likelihood this will help address the problem. Questions were answered. The patient expressed understanding & wishes to proceed. Pt Education - [place]

## 2018-02-10 NOTE — Therapy (Signed)
[place], [place], [address] Phone: [phone]   Fax:  [phone]  Physical Therapy Evaluation  Patient Details  Name: [name] MRN: [id number] Date of Birth: 1946-10-03 Referring Provider: Dr. Erroll Luna   Encounter Date: 02/10/2018  PT End of Session - 02/10/18 1150    Visit Number  1    Number of Visits  1    PT Start Time  8466    PT Stop Time  1055    PT Time Calculation (min)  26 min    Activity Tolerance  Patient tolerated treatment well    Behavior During Therapy  [facility] for tasks assessed/performed       Past Medical History:  Diagnosis Date  . Anxiety   . Arthritis   . Hypertension     Past Surgical History:  Procedure Laterality Date  . ABDOMINAL HYSTERECTOMY    . BREAST BIOPSY Left 2016  . BREAST SURGERY     cyst removal  . CARPOMETACARPEL SUSPENSION PLASTY Right 10/30/2016   Procedure: SUSPENSION PLASTY abductor pollicis longus transfer excision trapezium right;  Surgeon: Daryll Brod, MD;  Location: [facility];  Service: Orthopedics;  Laterality: Right;  axillary block  SUSPENSION PLASTY abductor pollicis longus transfer excision trapezium right  . KIDNEY DONATION Left   . WRIST SURGERY     cyst removal    There were no vitals filed for this visit.   Subjective Assessment - 02/10/18 1142    Subjective  Patient reports she is here today to be seen by her medical team for her newly diagnosed left breast cancer.    Patient is accompained by:  Family member    Pertinent History  Patient was diagnosed on 02/01/18 with left grade 3 invasive ductal carcinoma breast cancer. It measures 2.3 cm and is located in the upper inner quadrant. It is ER positive, PR negative, and HER2 positive with a Ki67 of 30%. She underwent a right rotator cuff repair in 2004.    Patient Stated Goals  Reduce lymphedema risk and learn post op shoulder ROM HEP    Currently in Pain?   No/denies         [facility] PT Assessment - 02/10/18 0001      Assessment   Medical Diagnosis  Left breast cancer    Referring Provider  Dr. [name] Cornett    Onset Date/Surgical Date  02/01/18    Hand Dominance  Right    Prior Therapy  none      Precautions   Precautions  Other (comment)    Precaution Comments  active cancer      Restrictions   Weight Bearing Restrictions  No      Balance Screen   Has the patient fallen in the past 6 months  No    Has the patient had a decrease in activity level because of a fear of falling?   No    Is the patient reluctant to leave their home because of a fear of falling?   No      Home Environment   Living Environment  Private residence    Living Arrangements  Spouse/significant other    Available Help at Discharge  Family      Prior Function   Level of Independence  Independent    Vocation  Full time employment    Vocation Requirements  [employer] work but is not doing this until after completion of treatment  Leisure  She does not exercise      Cognition   Overall Cognitive Status  Within Functional Limits for tasks assessed      Posture/Postural Control   Posture/Postural Control  Postural limitations    Postural Limitations  Rounded Shoulders;Forward head      ROM / Strength   AROM / PROM / Strength  AROM;Strength      AROM   AROM Assessment Site  Shoulder;Cervical    Right/Left Shoulder  Right;Left    Right Shoulder Extension  38 Degrees    Right Shoulder Flexion  155 Degrees    Right Shoulder ABduction  166 Degrees    Right Shoulder Internal Rotation  69 Degrees    Right Shoulder External Rotation  63 Degrees    Left Shoulder Extension  42 Degrees    Left Shoulder Flexion  149 Degrees    Left Shoulder ABduction  160 Degrees    Left Shoulder Internal Rotation  66 Degrees    Left Shoulder External Rotation  75 Degrees    Cervical Flexion  WNL    Cervical Extension  WNL    Cervical - Right Side Bend  WNL    Cervical  - Left Side Bend  WNL    Cervical - Right Rotation  WNL    Cervical - Left Rotation  WNL      Strength   Overall Strength  Within functional limits for tasks performed        LYMPHEDEMA/ONCOLOGY QUESTIONNAIRE - 02/10/18 1149      Type   Cancer Type  Left breast cancer      Lymphedema Assessments   Lymphedema Assessments  Upper extremities      Right Upper Extremity Lymphedema   10 cm Proximal to Olecranon Process  26.9 cm    Olecranon Process  24.4 cm    10 cm Proximal to Ulnar Styloid Process  21.1 cm    Just Proximal to Ulnar Styloid Process  15 cm    Across Hand at [employer]  18.8 cm    At [place] of 2nd Digit  6.1 cm      Left Upper Extremity Lymphedema   10 cm Proximal to Olecranon Process  27.5 cm    Olecranon Process  24.4 cm    10 cm Proximal to Ulnar Styloid Process  20.4 cm    Just Proximal to Ulnar Styloid Process  15.4 cm    Across Hand at [employer]  18.5 cm    At [place] of 2nd Digit  5.8 cm          Objective measurements completed on examination: See above findings.    Patient was instructed today in a home exercise program today for post op shoulder range of motion. These included active assist shoulder flexion in sitting, scapular retraction, wall walking with shoulder abduction, and hands behind head external rotation.  She was encouraged to do these twice a day, holding 3 seconds and repeating 5 times when permitted by her physician.     PT Education - 02/10/18 1150    Education provided  Yes    Education Details  Lymphedema risk reduction and post op shoulder ROM HEP    Person(s) Educated  Patient    Methods  Explanation;Demonstration;Handout    Comprehension  Returned demonstration;Verbalized understanding          PT Long Term Goals - 02/10/18 1153      PT LONG TERM GOAL #1  Title  Patient will demonstrate she has returned to baseline related to shoulder ROM and function.    Time  8    Period  Weeks    Status  New       Breast Clinic Goals - 02/10/18 1153      Patient will be able to verbalize understanding of pertinent lymphedema risk reduction practices relevant to her diagnosis specifically related to skin care.   Time  1    Period  Days    Status  Achieved      Patient will be able to return demonstrate and/or verbalize understanding of the post-op home exercise program related to regaining shoulder range of motion.   Time  1    Period  Days    Status  Achieved      Patient will be able to verbalize understanding of the importance of attending the postoperative After Breast Cancer Class for further lymphedema risk reduction education and therapeutic exercise.   Time  1    Period  Days    Status  Achieved            Plan - 02/10/18 1151    Clinical Impression Statement  Patient was diagnosed on 02/01/18 with left grade 3 invasive ductal carcinoma breast cancer. It measures 2.3 cm and is located in the upper inner quadrant. It is ER positive, PR negative, and HER2 positive with a Ki67 of 30%. She underwent a right rotator cuff repair in 2004. Her multidisciplinary medical team met prior to her assessments to determine a recommended treatment plan. She is planning to have neoadjuvant chmotherapy, a left lumpectomy and sentinel node biopsy, radiation, and anti-estrogen therapy. She will benefit from a post op PT visit to reassess and determine needs.    History and Personal Factors relevant to plan of care:  None    Clinical Presentation  Stable    Clinical Decision Making  Low    Rehab Potential  Excellent    Clinical Impairments Affecting Rehab Potential  None    PT Frequency  One time visit    PT Treatment/Interventions  Therapeutic exercise;Patient/family education;ADLs/Self Care Home Management    PT Next Visit Plan  Will reassess post operatively if referred by her surgeon    PT [place] op shoulder ROM HEP    Consulted and Agree with Plan of Care  Patient      Patient  will follow up at outpatient cancer rehab 3-4 weeks following surgery.  If the patient requires physical therapy at that time, a specific plan will be dictated and sent to the referring physician for approval. The patient was educated today on appropriate basic range of motion exercises to begin post operatively and the importance of attending the After Breast Cancer class following surgery.  Patient was educated today on lymphedema risk reduction practices as it pertains to recommendations that will benefit the patient immediately following surgery.  She verbalized good understanding.     Patient will benefit from skilled therapeutic intervention in order to improve the following deficits and impairments:  Decreased range of motion, Decreased knowledge of precautions, Impaired UE functional use, Postural dysfunction, Pain  Visit Diagnosis: Malignant neoplasm of upper-inner quadrant of left breast in female, estrogen receptor positive ([facility]) - Plan: PT plan of care cert/re-cert  Abnormal posture - Plan: PT plan of care cert/re-cert     Problem List Patient Active Problem List   Diagnosis Date Noted  . Malignant neoplasm of upper-inner  quadrant of left breast in female, estrogen receptor positive ([facility]) 02/09/2018    Annia Friendly, PT 02/10/18 12:00 PM  [place], [place], [address] Phone: [phone]   Fax:  [phone]  Name: [name] MRN: [id number] Date of Birth: [birth year]

## 2018-02-10 NOTE — Progress Notes (Signed)
[facility] CONSULT NOTE  Patient Care Team: [name], MD as PCP - General (Internal Medicine)  CHIEF COMPLAINTS/PURPOSE OF CONSULTATION:  Newly diagnosed breast cancer  HISTORY OF PRESENTING ILLNESS:  [name] 72 y.o. female is here because of recent diagnosis of left breast cancer.  Patient felt a left breast lump and was further evaluated with a mammogram and ultrasound.  She had a 2.3 cm mass at 11 o'clock position.  There was a 6 mm lesion which was benign.  Biopsy of this tumor revealed invasive ductal carcinoma grade 3 that was ER 30% PR 0% Ki-67 30% and HER-2 positive with a ratio of 5.62.  She had an extra lymph node biopsy which was benign.  She was presented to the multidisciplinary tumor board and she is here today at the [facility] clinic to discuss her treatment plan.  I reviewed her records extensively and collaborated the history with the patient.  SUMMARY OF ONCOLOGIC HISTORY:   Malignant neoplasm of upper-inner quadrant of left breast in female, estrogen receptor positive ([facility])   02/03/2018 Initial Diagnosis    Left breast palpable lump at 1130 position 2.3 cm, at 11 o'clock position 6 mm which was benign; biopsy revealed IDC grade 3 ER 30%, PR 0%, Ki-67 30%, HER-2 positive ratio 5.62, copy #16.3, axillary lymph node biopsy benign concordant, T2 N0 stage II a clinical stage AJCC 8      MEDICAL HISTORY:  Past Medical History:  Diagnosis Date  . Anxiety   . Arthritis   . Hypertension     SURGICAL HISTORY: Past Surgical History:  Procedure Laterality Date  . ABDOMINAL HYSTERECTOMY    . BREAST BIOPSY Left 2016  . BREAST SURGERY     cyst removal  . CARPOMETACARPEL SUSPENSION PLASTY Right 10/30/2016   Procedure: SUSPENSION PLASTY abductor pollicis longus transfer excision trapezium right;  Surgeon: Daryll Brod, MD;  Location: [facility];  Service: Orthopedics;  Laterality: Right;  axillary block  SUSPENSION PLASTY abductor pollicis  longus transfer excision trapezium right  . KIDNEY DONATION Left   . WRIST SURGERY     cyst removal    SOCIAL HISTORY: Social History   Socioeconomic History  . Marital status: Single    Spouse name: Not on file  . Number of children: Not on file  . Years of education: Not on file  . Highest education level: Not on file  Social Needs  . Financial resource strain: Not on file  . Food insecurity - worry: Not on file  . Food insecurity - inability: Not on file  . Transportation needs - medical: Not on file  . Transportation needs - non-medical: Not on file  Occupational History  . Not on file  Tobacco Use  . Smoking status: Current Every Day Smoker    Packs/day: 1.00    Years: 47.00    Pack years: 47.00    Types: Cigarettes  . Smokeless tobacco: Never Used  Substance and Sexual Activity  . Alcohol use: No  . Drug use: No  . Sexual activity: Not on file  Other Topics Concern  . Not on file  Social History Narrative  . Not on file    FAMILY HISTORY: Family History  Problem Relation Age of Onset  . Liver cancer Mother   . Lung cancer Father     ALLERGIES:  is allergic to codeine and demerol [meperidine].  MEDICATIONS:  Current Outpatient Medications  Medication Sig Dispense Refill  . amLODipine (NORVASC)  5 MG tablet Take 5 mg by mouth daily.    . hydrochlorothiazide (HYDRODIURIL) 12.5 MG tablet     . hydrOXYzine (ATARAX/VISTARIL) 25 MG tablet Take 50 mg by mouth 3 (three) times daily as needed.     . latanoprost (XALATAN) 0.005 % ophthalmic solution     . omeprazole (PRILOSEC) 20 MG capsule     . oxyCODONE-acetaminophen (PERCOCET) 7.5-325 MG tablet Take by mouth.    . pramipexole (MIRAPEX) 0.25 MG tablet Take 0.25-1 mg by mouth at bedtime.    . traZODone (DESYREL) 100 MG tablet Take 100 mg by mouth every morning.     No current facility-administered medications for this visit.     REVIEW OF SYSTEMS:   Constitutional: Denies fevers, chills or abnormal night  sweats Eyes: Denies blurriness of vision, double vision or watery eyes Ears, nose, mouth, throat, and face: Denies mucositis or sore throat Respiratory: Denies cough, dyspnea or wheezes Cardiovascular: Denies palpitation, chest discomfort or lower extremity swelling Gastrointestinal:  Denies nausea, heartburn or change in bowel habits Skin: Denies abnormal skin rashes Lymphatics: Denies new lymphadenopathy or easy bruising Neurological: Tingling and numbness in extremities since 3 weeks Behavioral/Psych: Mood is stable, no new changes  Breast: Palpable lump in the left breast All other systems were reviewed with the patient and are negative.  PHYSICAL EXAMINATION: ECOG PERFORMANCE STATUS: 1 - Symptomatic but completely ambulatory  Vitals:   02/10/18 0831  BP: (!) 154/76  Pulse: 86  Resp: 17  Temp: 98 F (36.7 C)  SpO2: 100%   Filed Weights   02/10/18 0831  Weight: 146 lb 9.6 oz (66.5 kg)    GENERAL:alert, no distress and comfortable SKIN: skin color, texture, turgor are normal, no rashes or significant lesions EYES: normal, conjunctiva are pink and non-injected, sclera clear OROPHARYNX:no exudate, no erythema and lips, buccal mucosa, and tongue normal  NECK: supple, thyroid normal size, non-tender, without nodularity LYMPH:  no palpable lymphadenopathy in the cervical, axillary or inguinal LUNGS: clear to auscultation and percussion with normal breathing effort HEART: regular rate & rhythm and no murmurs and no lower extremity edema ABDOMEN:abdomen soft, non-tender and normal bowel sounds Musculoskeletal:no cyanosis of digits and no clubbing  PSYCH: alert & oriented x 3 with fluent speech NEURO: 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  ASSESSMENT AND PLAN:  Malignant neoplasm of upper-inner quadrant of left breast in female, estrogen receptor positive ([facility]) 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, copy #16.3, axillary lymph node biopsy benign concordant, T2 N0 stage II a clinical stage AJCC 8  Pathology and radiology counseling: Discussed with the patient, the details of pathology including the type of breast cancer,the clinical staging, the significance of ER, PR and HER-2/neu receptors and the implications for treatment. After reviewing the pathology in detail, we  proceeded to discuss the different treatment options between surgery, radiation, chemotherapy, antiestrogen therapies.  Treatment plan: 1.  Neoadjuvant chemotherapy with TCH Perjeta x6 cycles followed by Herceptin Perjeta maintenance for 1 year 2. followed by breast conserving surgery 3 followed by adjuvant radiation 4.  Followed by adjuvant antiestrogen therapy with letrozole for 5-7 years ------------------------------------------------------------------------------------------------------------------------ Chemotherapy Counseling: I discussed the risks and benefits of chemotherapy including the risks of nausea/ vomiting, risk of infection from low WBC count, fatigue due to chemo or anemia, bruising or bleeding due to low platelets, mouth sores, loss/ change in taste and decreased appetite. Liver and kidney function will be monitored through out chemotherapy as abnormalities in liver and kidney function may be a side effect of treatment. Cardiac dysfunction due to Herceptin and Perjeta were  discussed in detail. Risk of permanent bone marrow dysfunction and leukemia due to chemo were also discussed.  Plan: 1. Port placement 2. Echocardiogram 3. Chemotherapy class 4. Start chemotherapy in 2 weeks  UPBEAT clinical trial ([id number]): Newly diagnosed stage I to III breast cancer patients receiving either adjuvant or neoadjuvant chemotherapy undergo cardiac MRI before treatment and at 24 months along with neurocognitive testing, exercise and disability measures at baseline 3, 12 and 24 months.  Patient has existing tingling and numbness in the lower extremities.  We will have to closely monitor for neuropathy. Tobacco abuse: I discussed with her about quitting smoking. Return to clinic to start chemotherapy  All questions were answered. The patient knows to call the clinic with any problems, questions or concerns.    Harriette Ohara, MD 02/10/18

## 2018-02-10 NOTE — H&P (View-Only) (Signed)
[name] Documented: 02/10/2018 7:26 AM Location: [facility] Patient #: [id number] DOB: 06/09/1946 Undefined / Language: English / Race: White Female  History of Present Illness ([name] A. Michelle Eilert MD; 02/10/2018 10:58 AM) Patient words: Pt sent at the request of Dr Squire for left breast mass times 1 month. Pt noted a mass to her left breast at 11 oclock. 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 No nipple discharge or other complaints.        CLINICAL DATA: The patient had a benign biopsy in the left breast several years ago. She presents with a new lump in this region today.  EXAM: DIGITAL DIAGNOSTIC BILATERAL MAMMOGRAM WITH CAD AND TOMO  ULTRASOUND BILATERAL BREAST  COMPARISON: Previous exam(s).  ACR Breast Density Category b: There are scattered areas of fibroglandular density.  FINDINGS: There is a focal asymmetry in the medial superior right breast at a posterior depth measuring up to 10 mm, which was not seen on the previous study. No other abnormalities on the right. There is a new mass in the left breast located between 11 and 11 30. There are some calcifications along the anterior margin of this mass. No other suspicious mammographic changes.  Mammographic images were processed with CAD.  On physical exam, no suspicious lumps are identified.  Targeted ultrasound is performed, showing no abnormalities on the right. There is a new solid mass in the left breast at 11:30, 4 cm from the nipple measuring 2.3 x 1.2 x 1.9 cm. Adjacent to this mass at 11 o'clock, 4 cm from the nipple is another hypoechoic mass measuring 6 x 6 x 5 mm. These 2 masses are 1.5 cm apart. There is a borderline node in the left axilla with a cortex measuring 4.3 mm.  IMPRESSION: New suspicious palpable mass in the left breast. There is an adjacent indeterminate mass. Borderline lymph node in the left axilla. There is an indeterminate mass in  the right breast located medially and superiorly, only seen mammographically.  RECOMMENDATION: Recommend stereotactic biopsy of the right breast mass, sonographically guided biopsy of both of the left breast masses, an ultrasound-guided biopsy of the borderline lymph node.  I have discussed the findings and recommendations with the patient. Results were also provided in writing at the conclusion of the visit. If applicable, a reminder letter will be sent to the patient regarding the next appointment.  BI-RADS CATEGORY 4: Suspicious.   Electronically Signed By: David Williams III M.D On: 02/01/2018 09:31              ADDITIONAL INFORMATION: 1. PROGNOSTIC INDICATORS Results: IMMUNOHISTOCHEMICAL AND MORPHOMETRIC ANALYSIS PERFORMED MANUALLY Estrogen Receptor: 30%, POSITIVE, MODERATE-WEAK STAINING INTENSITY Progesterone Receptor: 0%, NEGATIVE Proliferation Marker Ki67: 30% COMMENT: The negative hormone receptor study(ies) in this case has no internal positive control. REFERENCE RANGE ESTROGEN RECEPTOR NEGATIVE 0% POSITIVE =>1% REFERENCE RANGE PROGESTERONE RECEPTOR NEGATIVE 0% POSITIVE =>1% All controls stained appropriately DAWN BUTLER MD Pathologist, Electronic Signature ( Signed 02/09/2018) 1. FLUORESCENCE IN-SITU HYBRIDIZATION Results: HER2 - **POSITIVE** RATIO OF HER2/CEP17 SIGNALS 5.62 AVERAGE HER2 COPY NUMBER PER CELL 16.30 Reference Range: 1 of 3 FINAL for Geraghty, Talia B ([id number]) ADDITIONAL INFORMATION:(continued) NEGATIVE HER2/CEP17 Ratio <2.0 and average HER2 copy number <4.0 EQUIVOCAL HER2/CEP17 Ratio <2.0 and average HER2 copy number 4.0 and <6.0 POSITIVE HER2/CEP17 Ratio >=2.0 or <2.0 and average HER2 copy number >=6.0 JULIA MANNY MD Pathologist, Electronic Signature ( Signed 02/10/2018) FINAL DIAGNOSIS Diagnosis 1. 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 - SEE COMMENT. 2. Breast, left, needle  core biopsy, 11:00 o'clock - SCANT BENIGN FIBROADIPOSE TISSUE. - THERE IS NO EVIDENCE OF MALIGNANCY. 3. Lymph node, needle/core biopsy, left axilla - THERE IS NO EVIDENCE OF CARCINOMA IN 1 OF 1 LYMPH NODE (0/1). 4. Breast, right, needle core biopsy, posterior UIQ - FIBROADENOMA. - THERE IS NO EVIDENCE OF MALIGNANCY. Microscopic Comment 1. The carcinoma appears grade III. A breast prognostic.  The patient is a 72 year old female.   Past Surgical History [name], RN; 02/10/2018 7:26 AM) Appendectomy Breast Biopsy Bilateral. multiple Cataract Surgery Bilateral. Colon Polyp Removal - Colonoscopy Foot Surgery Bilateral. Hysterectomy (due to cancer) - Complete Nephrectomy Left. Oral Surgery Shoulder Surgery Right. Tonsillectomy  Diagnostic Studies History [name], RN; 02/10/2018 7:26 AM) Colonoscopy 1-5 years ago Mammogram within last year Pap Smear >5 years ago  Medication History [name], RN; 02/10/2018 7:27 AM) Medications Reconciled  Social History [name], RN; 02/10/2018 7:26 AM) Caffeine use Carbonated beverages, Coffee, Tea. No alcohol use Tobacco use Current some day smoker.  Family History [name], RN; 02/10/2018 7:26 AM) Alcohol Abuse Brother, Mother. Arthritis Brother, Sister. Breast Cancer Family Members In General. Cerebrovascular Accident Brother, Father. Cervical Cancer Family Members In General. Hypertension Brother, Sister. Kidney Disease Brother. Migraine Headache Son. Respiratory Condition Father.  Pregnancy / Birth History [name], RN; 02/10/2018 7:26 AM) Age at menarche 27 years. Age of menopause <47 Contraceptive History Oral contraceptives. Gravida 2 Irregular periods Maternal age 62-20 Para 1  Other Problems [name], RN; 02/10/2018 7:26 AM) Anxiety Disorder Arthritis Breast Cancer Chest pain Diverticulosis Gastroesophageal Reflux Disease General  anesthesia - complications Hepatitis High blood pressure Lump In Breast Oophorectomy Bilateral. Transfusion history     Review of Systems [name] RN; 02/10/2018 7:26 AM) General Present- Fatigue and Night Sweats. Not Present- Appetite Loss, Chills, Fever, Weight Gain and Weight Loss. Skin Present- Dryness. Not Present- Change in Wart/Mole, Hives, Jaundice, New Lesions, Non-Healing Wounds, Rash and Ulcer. HEENT Present- Wears glasses/contact lenses. Not Present- Earache, Hearing Loss, Hoarseness, Nose Bleed, Oral Ulcers, Ringing in the Ears, Seasonal Allergies, Sinus Pain, Sore Throat, Visual Disturbances and Yellow Eyes. Respiratory Not Present- Bloody sputum, Chronic Cough, Difficulty Breathing, Snoring and Wheezing. Breast Present- Breast Mass and Breast Pain. Not Present- Nipple Discharge and Skin Changes. Cardiovascular Not Present- Chest Pain, Difficulty Breathing Lying Down, Leg Cramps, Palpitations, Rapid Heart Rate, Shortness of Breath and Swelling of Extremities. Gastrointestinal Present- Gets full quickly at meals and Indigestion. Not Present- Abdominal Pain, Bloating, Bloody Stool, Change in Bowel Habits, Chronic diarrhea, Constipation, Difficulty Swallowing, Excessive gas, Hemorrhoids, Nausea, Rectal Pain and Vomiting. Neurological Present- Numbness and Tingling. Not Present- Decreased Memory, Fainting, Headaches, Seizures, Tremor, Trouble walking and Weakness. Psychiatric Present- Anxiety. Not Present- Bipolar, Change in Sleep Pattern, Depression, Fearful and Frequent crying. Endocrine Present- Hot flashes. Not Present- Cold Intolerance, Excessive Hunger, Hair Changes, Heat Intolerance and New Diabetes.   Physical Exam (Kharee Lesesne A. Demareon Coldwell MD; 02/10/2018 10:59 AM)  General Mental Status-Alert. General Appearance-Consistent with stated age. Hydration-Well hydrated. Voice-Normal.  Head and Neck Head-normocephalic, atraumatic with no lesions or palpable  masses. Trachea-midline. Thyroid Gland Characteristics - normal size and consistency.  Chest and Lung Exam Chest and lung exam reveals -quiet, even and easy respiratory effort with no use of accessory muscles and on auscultation, normal breath sounds, no adventitious sounds and normal vocal resonance. Inspection Chest Wall - Normal. Back - normal.  Breast Breast -  Left-Symmetric, Non Tender, No Biopsy scars, no Dimpling, No Inflammation, No Lumpectomy scars, No Mastectomy scars, No Peau d' Orange. Breast - Right-Symmetric, Non Tender, No Biopsy scars, no Dimpling, No Inflammation, No Lumpectomy scars, No Mastectomy scars, No Peau d' Orange. Breast Lump-No Palpable Breast Mass.  Cardiovascular Cardiovascular examination reveals -normal heart sounds, regular rate and rhythm with no murmurs and normal pedal pulses bilaterally.  Neurologic Neurologic evaluation reveals -alert and oriented x 3 with no impairment of recent or remote memory. Mental Status-Normal.  Lymphatic Head & Neck  General Head & Neck Lymphatics: Bilateral - Description - Normal. Axillary  General Axillary Region: Bilateral - Description - Normal. Tenderness - Non Tender.    Assessment & Plan (Liliana Dang A. Cruzita Lipa MD; 02/10/2018 11:00 AM)  BREAST CANCER, LEFT (C50.912) Impression: stage 2 her 2 neu positive neoadjuvant chemotherapy needs port placement breast conservation down the road. Pt requires port placement for chemotherapy. Risk include bleeding, infection, pneumothorax, hemothorax, mediastinal injury, nerve injury , blood vessel injury, strke, blood clots, death, migration. embolization and need for additional procedures. Pt agrees to proceed.  Current Plans Use of a central venous catheter for intravenous therapy was discussed. Technique of catheter placement using ultrasound and fluoroscopy guidance was discussed. Risks such as bleeding, infection, pneumothorax, catheter occlusion,  reoperation, and other risks were discussed. I noted a good likelihood this will help address the problem. Questions were answered. The patient expressed understanding & wishes to proceed. Pt Education - [place]

## 2018-02-10 NOTE — Progress Notes (Signed)
Nutrition Assessment  Reason for Assessment:  Pt seen in Breast Clinic  ASSESSMENT:   72 year old female with new diagnosis of breast cancer.  Past medical history reviewed.  Patient reports normal appetite  Medications:  reviewed  Labs: reviewed  Anthropometrics:   Height: 62 inches Weight: 146 lb 9.6 oz BMI: 26   NUTRITION DIAGNOSIS: Food and nutrition related knowledge deficit related to new diagnosis of breast cancer as evidenced by no prior need for nutrition related information.  INTERVENTION:   Discussed and provided packet of information regarding nutritional tips for breast cancer patients.  Questions answered.  Teachback method used.  Contact information provided and patient knows to contact me with questions/concerns.    MONITORING, EVALUATION, and GOAL: Pt will consume a healthy plant based diet to maintain lean body mass throughout treatment.   [name] B. Zenia Resides, [place], [place] Registered Dietitian [phone] (pager)

## 2018-02-10 NOTE — Progress Notes (Signed)
Clinical Social Work [place] Psychosocial Distress Screening [place]  Patient completed distress screening protocol and scored a 8 on the Psychosocial Distress Thermometer which indicates moderate distress. Clinical Social Worker met with patient and patients family in [facility] to assess for distress and other psychosocial needs. Patient stated she was feeling overwhelmed but felt "better" after meeting with the treatment team and getting more information on her treatment plan. CSW and patient discussed common feeling and emotions when being diagnosed with cancer, and the importance of support during treatment. CSW informed patient of the support team and support services at [facility]. CSW provided contact information and encouraged patient to call with any questions or concerns.  ONCBCN DISTRESS SCREENING 02/10/2018  Screening Type Initial Screening  Distress experienced in past week (1-10) 8  Family Problem type Partner  Emotional problem type Nervousness/Anxiety;Adjusting to illness  Information Concerns Type Lack of info about diagnosis;Lack of info about treatment     Johnnye Lana, MSW, LCSW, OSW-C Clinical Social Worker [facility] [phone]

## 2018-02-10 NOTE — Progress Notes (Signed)
START OFF PATHWAY REGIMEN - Breast   [id number]:Docetaxel + Trastuzumab + Pertuzumab (THP) q21 Days (Metastatic):   A cycle is every 21 days:     Pertuzumab      Pertuzumab      Trastuzumab      Trastuzumab      Docetaxel   **Always confirm dose/schedule in your pharmacy ordering system**    Patient Characteristics: Preoperative or Nonsurgical Candidate (Clinical Staging), Neoadjuvant Therapy followed by Surgery, Invasive Disease, Chemotherapy, HER2 Positive, ER Positive Therapeutic Status: Preoperative or Nonsurgical Candidate (Clinical Staging) AJCC M Category: cM0 AJCC Grade: G3 Breast Surgical Plan: Neoadjuvant Therapy followed by Surgery ER Status: Positive (+) AJCC 8 Stage Grouping: IIA HER2 Status: Positive (+) AJCC T Category: cT2 AJCC N Category: cN0 PR Status: Negative (-) Intent of Therapy: Curative Intent, Discussed with Patient

## 2018-02-11 ENCOUNTER — Encounter: Payer: Self-pay | Admitting: Genetic Counselor

## 2018-02-11 ENCOUNTER — Inpatient Hospital Stay (HOSPITAL_BASED_OUTPATIENT_CLINIC_OR_DEPARTMENT_OTHER): Payer: Medicare HMO | Admitting: Genetic Counselor

## 2018-02-11 DIAGNOSIS — Z170 Estrogen receptor positive status [ER+]: Secondary | ICD-10-CM | POA: Diagnosis not present

## 2018-02-11 DIAGNOSIS — C50212 Malignant neoplasm of upper-inner quadrant of left female breast: Secondary | ICD-10-CM

## 2018-02-11 DIAGNOSIS — Z803 Family history of malignant neoplasm of breast: Secondary | ICD-10-CM | POA: Insufficient documentation

## 2018-02-11 NOTE — Progress Notes (Signed)
[facility]      Initial Visit   Patient Name: [name] Patient DOB: [birth year] Patient Age: 72 y.o. Encounter Date: 02/11/2018  Referring Provider: Nicholas Lose, MD  Primary Care Provider: [name], MD  Reason for Visit: Evaluate for hereditary susceptibility to cancer    Assessment and Plan:  . [name]'s history is not suggestive of a hereditary predisposition to cancer given the ages at diagnosis for her and her relatives. She meets NCCN criteria, however, for genetic testing.  . Testing is available to determine whether she has a pathogenic mutation that will impact her screening and risk-reduction for cancer. A negative result will be reassuring.  . [name] wished to pursue genetic testing and a blood sample will be sent for analysis of the 83 genes on [facility]'s Multi-Cancer panel (ALK, APC, ATM, AXIN2, BAP1, BARD1, BLM, BMPR1A, BRCA1, BRCA2, BRIP1, CASR, CDC73, CDH1, CDK4, CDKN1B, CDKN1C, CDKN2A, CEBPA, CHEK2, CTNNA1, DICER1, DIS3L2, EGFR, EPCAM, FH, FLCN, GATA2, GPC3, GREM1, HOXB13, HRAS, KIT, MAX, MEN1, MET, MITF, MLH1, MSH2, MSH3, MSH6, MUTYH, NBN, NF1, NF2, NTHL1, PALB2, PDGFRA, PHOX2B, PMS2, POLD1, POLE, POT1, PRKAR1A, PTCH1, PTEN, RAD50, RAD51C, RAD51D, RB1, RECQL4, RET, RUNX1, SDHA, SDHAF2, SDHB, SDHC, SDHD, SMAD4, SMARCA4, SMARCB1, SMARCE1, STK11, SUFU, TERC, TERT, TMEM127, TP53, TSC1, TSC2, VHL, WRN, WT1).   . Results should be available in approximately 2-4 weeks, at which point we will contact her and address implications for her as well as address genetic testing for at-risk family members, if needed.     Dr. Lindi Adie was available for questions concerning this case. Total time spent by me in face-to-face counseling was approximately 30 minutes.   _____________________________________________________________________   History of Present Illness: Ms. [name], a 72 y.o. female, is being seen at the [facility] due to a personal and family history of cancer. She presents to clinic today with her sister to discuss the possibility of a hereditary predisposition to cancer and discuss whether genetic testing is warranted.  Ms. [name] was recently diagnosed with breast cancer at the age of 83. She will be starting her treatment with neoadjuvant chemotherapy.     Malignant neoplasm of upper-inner quadrant of left breast in female, estrogen receptor positive ([place])   02/03/2018 Initial Diagnosis    Left breast palpable lump at 1130 position 2.3 cm, at 11 o'clock position 6 mm which was benign; biopsy revealed IDC grade 3 ER 30%, PR 0%, Ki-67 30%, HER-2 positive ratio 5.62, copy #16.3, axillary lymph node biopsy benign concordant, T2 N0 stage II a clinical stage AJCC 8       Past Medical History:  Diagnosis Date  . Anxiety   . Arthritis   . Breast cancer ([place])   . Family history of breast cancer   . Hypertension     Past Surgical History:  Procedure Laterality Date  . ABDOMINAL HYSTERECTOMY     1970's due to ectopic pregnancy  . BREAST BIOPSY Left 2016  . BREAST SURGERY     cyst removal  . CARPOMETACARPEL SUSPENSION PLASTY Right 10/30/2016   Procedure: SUSPENSION PLASTY abductor pollicis longus transfer excision trapezium right;  Surgeon: Daryll Brod, MD;  Location: [facility];  Service: Orthopedics;  Laterality: Right;  axillary block  SUSPENSION PLASTY abductor pollicis longus transfer excision trapezium right  . KIDNEY DONATION Left   . WRIST SURGERY     cyst removal    Social History  Socioeconomic History  . Marital status: Single    Spouse name: Not on file  . Number of children: Not on file  . Years of education: Not on file  . Highest education level: Not on file  Social Needs  . Financial resource strain: Not on file  . Food insecurity - worry: Not on file  . Food insecurity - inability: Not on file  . Transportation needs - medical: Not on  file  . Transportation needs - non-medical: Not on file  Occupational History  . Not on file  Tobacco Use  . Smoking status: Current Every Day Smoker    Packs/day: 1.00    Years: 47.00    Pack years: 47.00    Types: Cigarettes  . Smokeless tobacco: Never Used  Substance and Sexual Activity  . Alcohol use: No  . Drug use: No  . Sexual activity: Not on file  Other Topics Concern  . Not on file  Social History Narrative  . Not on file     Family History:  During the visit, a 4-generation pedigree was obtained. Family tree will be scanned in the Media tab in Epic  Significant diagnoses include the following:  Family History  Problem Relation Age of Onset  . Liver cancer Mother        deceased 68  . Lung cancer Father 21       deceased 47; smoker  . Stomach cancer Maternal Grandmother        deceased 29  . Cancer Paternal Uncle        deceased 71; unk. type  . Breast cancer Maternal Aunt        dx 85s; deceased 60s  . Breast cancer Maternal Aunt        dx 82s; deceased 78s  . Breast cancer Cousin        daughter of mat aunt with breast ca    Additionally, [name] has one son (age 14).  She had a total of 7 brothers and 2 sisters. Her mother had 7 sisters and a brother. Her father had 2 brothers.  [name]'s ancestry is African American. There is no known Jewish ancestry and no consanguinity.  Discussion: We reviewed the characteristics, features and inheritance patterns of hereditary cancer syndromes. We discussed her risk of harboring a mutation in the context of her personal and family history. We discussed the process of genetic testing, insurance coverage and implications of results: positive, negative and variant of unknown significance (VUS).    [name] questions were answered to her satisfaction today and she is welcome to call with any additional questions or concerns. Thank you for the referral and allowing [place] to share in the care of your patient.     Steele Berg, MS, [place] Certified Genetic Counselor phone: [phone] [email or web address]

## 2018-02-14 HISTORY — PX: BREAST BIOPSY: SHX20

## 2018-02-16 ENCOUNTER — Telehealth: Payer: Self-pay

## 2018-02-16 ENCOUNTER — Other Ambulatory Visit: Payer: Medicare HMO

## 2018-02-16 ENCOUNTER — Ambulatory Visit (HOSPITAL_COMMUNITY)
Admission: RE | Admit: 2018-02-16 | Discharge: 2018-02-16 | Disposition: A | Discharge status: Home / Self Care | Payer: Medicare HMO | Source: Ambulatory Visit | Attending: Hematology and Oncology | Admitting: Hematology and Oncology

## 2018-02-16 ENCOUNTER — Telehealth: Payer: Self-pay | Admitting: *Deleted

## 2018-02-16 DIAGNOSIS — I051 Rheumatic mitral insufficiency: Secondary | ICD-10-CM | POA: Insufficient documentation

## 2018-02-16 DIAGNOSIS — Z170 Estrogen receptor positive status [ER+]: Secondary | ICD-10-CM

## 2018-02-16 DIAGNOSIS — C50212 Malignant neoplasm of upper-inner quadrant of left female breast: Secondary | ICD-10-CM

## 2018-02-16 DIAGNOSIS — Z01818 Encounter for other preprocedural examination: Secondary | ICD-10-CM | POA: Diagnosis present

## 2018-02-16 DIAGNOSIS — I5030 Unspecified diastolic (congestive) heart failure: Secondary | ICD-10-CM | POA: Insufficient documentation

## 2018-02-16 DIAGNOSIS — I420 Dilated cardiomyopathy: Secondary | ICD-10-CM | POA: Insufficient documentation

## 2018-02-16 NOTE — Telephone Encounter (Signed)
Attempted to call pt twice regarding ECHO results. Her phone seems to be giving her trouble.  Cyndia Bent RN

## 2018-02-16 NOTE — Telephone Encounter (Signed)
Left vm regarding needs from [facility] on 3.13.19. Contact information provided for questions or needs.

## 2018-02-16 NOTE — Progress Notes (Signed)
  Echocardiogram 2D Echocardiogram has been performed.  [name] Leh 02/16/2018, 12:48 PM

## 2018-02-17 ENCOUNTER — Inpatient Hospital Stay: Payer: Medicare HMO

## 2018-02-17 ENCOUNTER — Telehealth: Payer: Self-pay | Admitting: Hematology and Oncology

## 2018-02-17 ENCOUNTER — Encounter: Payer: Self-pay | Admitting: *Deleted

## 2018-02-17 ENCOUNTER — Ambulatory Visit
Admission: RE | Admit: 2018-02-17 | Discharge: 2018-02-17 | Disposition: A | Discharge status: Home / Self Care | Payer: Medicare HMO | Source: Ambulatory Visit | Attending: Hematology and Oncology | Admitting: Hematology and Oncology

## 2018-02-17 DIAGNOSIS — C50212 Malignant neoplasm of upper-inner quadrant of left female breast: Secondary | ICD-10-CM

## 2018-02-17 DIAGNOSIS — Z170 Estrogen receptor positive status [ER+]: Principal | ICD-10-CM

## 2018-02-17 MED ORDER — GADOBENATE DIMEGLUMINE 529 MG/ML IV SOLN
16.0000 mL | Freq: Once | INTRAVENOUS | Status: AC | PRN
  Administered 2018-02-17: 16 mL via INTRAVENOUS

## 2018-02-17 NOTE — Telephone Encounter (Signed)
Left message for patient regarding upcoming march and April appointments per 3/19 sch message.

## 2018-02-18 ENCOUNTER — Other Ambulatory Visit: Payer: Self-pay | Admitting: Hematology and Oncology

## 2018-02-18 ENCOUNTER — Telehealth: Payer: Self-pay | Admitting: Hematology and Oncology

## 2018-02-18 NOTE — Telephone Encounter (Signed)
Patient insurance required that we add carboplatin to her regimen. I changed the chemo regimen to include carboplatin as well. We will submit for re-authorization.

## 2018-02-19 ENCOUNTER — Encounter: Payer: Self-pay | Admitting: Genetic Counselor

## 2018-02-19 ENCOUNTER — Ambulatory Visit: Payer: Self-pay | Admitting: Genetic Counselor

## 2018-02-19 DIAGNOSIS — Z1379 Encounter for other screening for genetic and chromosomal anomalies: Secondary | ICD-10-CM

## 2018-02-19 HISTORY — DX: Encounter for other screening for genetic and chromosomal anomalies: Z13.79

## 2018-02-19 NOTE — Pre-Procedure Instructions (Signed)
SHIQUITA COLLIGNON  02/19/2018      [facility] ([facility] [place] [address] [place] [address] Phone: [phone] Fax: [phone]    Your procedure is scheduled on February 24, 2018.  Report to [facility] Admitting at 1000 AM.  Call this number if you have problems the morning of surgery:  [phone]   Remember:  Do not eat food or drink liquids after midnight.  Take these medicines the morning of surgery with A SIP OF WATER amlodipine (norvasc), bupropion (wellbutrin), hydroxyzine (atarax)-if needed.   Beginning now, STOP taking any Aspirin (unless otherwise instructed by your surgeon), Aleve, Naproxen, Ibuprofen, Motrin, Advil, Goody's, BC's, all herbal medications, fish oil, and all vitamins  Continue all other medications as instructed by your physician except follow the above medication instructions before surgery   Do not wear jewelry, make-up or nail polish.  Do not wear lotions, powders, or perfumes, or deodorant.  Do not shave 48 hours prior to surgery.  Do not bring valuables to the hospital.  [facility] is not responsible for any belongings or valuables.  Contacts, dentures or bridgework may not be worn into surgery.  Leave your suitcase in the car.  After surgery it may be brought to your room.  For patients admitted to the hospital, discharge time will be determined by your treatment team.  Patients discharged the day of surgery will not be allowed to drive home.   [place]- Preparing For Surgery  Before surgery, you can play an important role. Because skin is not sterile, your skin needs to be as free of germs as possible. You can reduce the number of germs on your skin by washing with CHG (chlorahexidine gluconate) Soap before surgery.  CHG is an antiseptic cleaner which kills germs and bonds with the skin to continue killing germs even after washing.  Please do not use if you have an  allergy to CHG or antibacterial soaps. If your skin becomes reddened/irritated stop using the CHG.  Do not shave (including legs and underarms) for at least 48 hours prior to first CHG shower. It is OK to shave your face.  Please follow these instructions carefully.   1. Shower the NIGHT BEFORE SURGERY and the MORNING OF SURGERY with CHG.   2. If you chose to wash your hair, wash your hair first as usual with your normal shampoo.  3. After you shampoo, rinse your hair and body thoroughly to remove the shampoo.  4. Use CHG as you would any other liquid soap. You can apply CHG directly to the skin and wash gently with a scrungie or a clean washcloth.   5. Apply the CHG Soap to your body ONLY FROM THE NECK DOWN.  Do not use on open wounds or open sores. Avoid contact with your eyes, ears, mouth and genitals (private parts). Wash Face and genitals (private parts)  with your normal soap.  6. Wash thoroughly, paying special attention to the area where your surgery will be performed.  7. Thoroughly rinse your body with warm water from the neck down.  8. DO NOT shower/wash with your normal soap after using and rinsing off the CHG Soap.  9. Pat yourself dry with a CLEAN TOWEL.  10. Wear CLEAN PAJAMAS to bed the night before surgery, wear comfortable clothes the morning of surgery  11. Place CLEAN SHEETS on your bed the night of your first shower and  DO NOT SLEEP WITH PETS.  Day of Surgery: Do not apply any deodorants/lotions. Please wear clean clothes to the hospital/surgery center.    Please read over the following fact sheets that you were given. Pain Booklet, Coughing and Deep Breathing and Surgical Site Infection Prevention

## 2018-02-19 NOTE — Progress Notes (Signed)
[facility]       Genetic Test Results    Patient Name: [name] Patient DOB: 11/13/1946 Patient Age: 72 y.o. Encounter Date: 02/19/2018  Referring Provider: Nicholas Lose, MD  Primary Care Provider: [name], MD   Ms. Yates was called today to discuss genetic test results. Please see the Genetics note from her visit on 02/11/2018 for a detailed discussion of her personal and family history.  Genetic Testing: At the time of Ms. Laba's visit, she decided to pursue genetic testing of multiple genes associated with hereditary susceptibility to cancer. Testing included sequencing and deletion/duplication analysis. Testing did not reveal a pathogenic mutation in any of the genes analyzed.  A copy of the genetic test report will be scanned into Epic under the Media tab.  The genes analyzed were the 83 genes on [facility]'s Multi-Cancer panel (ALK, APC, ATM, AXIN2, BAP1, BARD1, BLM, BMPR1A, BRCA1, BRCA2, BRIP1, CASR, CDC73, CDH1, CDK4, CDKN1B, CDKN1C, CDKN2A, CEBPA, CHEK2, CTNNA1, DICER1, DIS3L2, EGFR, EPCAM, FH, FLCN, GATA2, GPC3, GREM1, HOXB13, HRAS, KIT, MAX, MEN1, MET, MITF, MLH1, MSH2, MSH3, MSH6, MUTYH, NBN, NF1, NF2, NTHL1, PALB2, PDGFRA, PHOX2B, PMS2, POLD1, POLE, POT1, PRKAR1A, PTCH1, PTEN, RAD50, RAD51C, RAD51D, RB1, RECQL4, RET, RUNX1, SDHA, SDHAF2, SDHB, SDHC, SDHD, SMAD4, SMARCA4, SMARCB1, SMARCE1, STK11, SUFU, TERC, TERT, TMEM127, TP53, TSC1, TSC2, VHL, WRN, WT1).  Since the current test is not perfect, it is possible that there may be a gene mutation that current testing cannot detect, but that chance is small. It is possible that a different genetic factor, which has not yet been discovered or is not on this panel, is responsible for the cancer diagnoses in the family. Again, the likelihood of this is low. No additional testing is recommended at this time for Ms. Ralph.  Two Variants of Uncertain Significance were detected: ALK c.1190A>T  (p.Asp397Val) and PMS2 c.1058C>T (p.Ala353Val). This is still considered a normal result. While at this time, it is unknown if this finding is associated with increased cancer risk, the majority of these variants get reclassified to be inconsequential. Medical management should not be based on this finding. With time, we suspect the lab will determine the significance, if any. If we do learn more about it, we will try to contact Ms. Cieslak to discuss it further. It is important to stay in touch with [place] periodically and keep the address and phone number up to date.  Cancer Screening: These results suggest that Ms. Legore's cancer was most likely not due to an inherited predisposition. Most cancers happen by chance and this test, along with details of her family history, suggests that her cancer falls into this category. She is recommended to follow the cancer screening guidelines provided by her physician.   Family Members: Family members are at some increased risk of developing cancer, over the general population risk, simply due to the family history. Women are recommended to have a yearly mammogram beginning at age 16, a yearly clinical breast exam, a yearly gynecologic exam and perform monthly breast self-exams. Colon cancer screening is recommended to begin by age 54 in both men and women, unless there is a family history of colon cancer or colon polyps or an individual has a personal history to warrant initiating screening at a younger age.  Any relative who had cancer at a young age or had a particularly rare cancer may also wish to pursue genetic testing.  Genetic counselors can be located in other cities, by visiting the website of the [employer] ([employer]) and Field seismologist for a Dietitian by zip code.   Family members are not recommended to get tested for the above VUSs outside of a research protocol as this finding has no implications for their medical  management.  Lastly, cancer genetics is a rapidly advancing field and it is possible that new genetic tests will be appropriate for Ms. Lobello in the future. We encourage her to remain in contact with [place] on an annual basis so we can update her personal and family histories, and let her know of advances in cancer genetics that may benefit the family. Our contact number was provided. Ms. Biskup is welcome to call anytime with additional questions.     Steele Berg, MS, [place] Certified Genetic Counselor phone: [phone]

## 2018-02-22 ENCOUNTER — Encounter (HOSPITAL_COMMUNITY)
Admission: RE | Admit: 2018-02-22 | Discharge: 2018-02-22 | Disposition: A | Discharge status: Home / Self Care | Payer: Medicare HMO | Source: Ambulatory Visit | Attending: Surgery | Admitting: Surgery

## 2018-02-22 ENCOUNTER — Encounter (HOSPITAL_COMMUNITY): Payer: Self-pay

## 2018-02-22 DIAGNOSIS — F419 Anxiety disorder, unspecified: Secondary | ICD-10-CM | POA: Diagnosis not present

## 2018-02-22 DIAGNOSIS — Z170 Estrogen receptor positive status [ER+]: Secondary | ICD-10-CM | POA: Diagnosis not present

## 2018-02-22 DIAGNOSIS — I252 Old myocardial infarction: Secondary | ICD-10-CM | POA: Diagnosis not present

## 2018-02-22 DIAGNOSIS — M1990 Unspecified osteoarthritis, unspecified site: Secondary | ICD-10-CM | POA: Diagnosis not present

## 2018-02-22 DIAGNOSIS — N951 Menopausal and female climacteric states: Secondary | ICD-10-CM | POA: Diagnosis not present

## 2018-02-22 DIAGNOSIS — K759 Inflammatory liver disease, unspecified: Secondary | ICD-10-CM | POA: Diagnosis not present

## 2018-02-22 DIAGNOSIS — Z8719 Personal history of other diseases of the digestive system: Secondary | ICD-10-CM | POA: Diagnosis not present

## 2018-02-22 DIAGNOSIS — C50912 Malignant neoplasm of unspecified site of left female breast: Secondary | ICD-10-CM | POA: Diagnosis present

## 2018-02-22 DIAGNOSIS — I10 Essential (primary) hypertension: Secondary | ICD-10-CM | POA: Diagnosis not present

## 2018-02-22 DIAGNOSIS — K219 Gastro-esophageal reflux disease without esophagitis: Secondary | ICD-10-CM | POA: Diagnosis not present

## 2018-02-22 DIAGNOSIS — F17200 Nicotine dependence, unspecified, uncomplicated: Secondary | ICD-10-CM | POA: Diagnosis not present

## 2018-02-22 HISTORY — DX: Cough, unspecified: R05.9

## 2018-02-22 HISTORY — DX: Gastro-esophageal reflux disease without esophagitis: K21.9

## 2018-02-22 HISTORY — DX: Cough: R05

## 2018-02-22 HISTORY — DX: Restless legs syndrome: G25.81

## 2018-02-22 LAB — CBC WITH DIFFERENTIAL/PLATELET
Basophils Absolute: 0 10*3/uL (ref 0.0–0.1)
Basophils Relative: 0 %
EOS ABS: 0 10*3/uL (ref 0.0–0.7)
Eosinophils Relative: 0 %
HCT: 36.8 % (ref 36.0–46.0)
Hemoglobin: 12.2 g/dL (ref 12.0–15.0)
LYMPHS ABS: 3 10*3/uL (ref 0.7–4.0)
Lymphocytes Relative: 25 %
MCH: 28.4 pg (ref 26.0–34.0)
MCHC: 33.2 g/dL (ref 30.0–36.0)
MCV: 85.8 fL (ref 78.0–100.0)
Monocytes Absolute: 0.6 10*3/uL (ref 0.1–1.0)
Monocytes Relative: 5 %
Neutro Abs: 8.2 10*3/uL — ABNORMAL HIGH (ref 1.7–7.7)
Neutrophils Relative %: 70 %
PLATELETS: 413 10*3/uL — AB (ref 150–400)
RBC: 4.29 MIL/uL (ref 3.87–5.11)
RDW: 13.7 % (ref 11.5–15.5)
WBC: 11.8 10*3/uL — AB (ref 4.0–10.5)

## 2018-02-22 LAB — COMPREHENSIVE METABOLIC PANEL
ALT: 13 U/L — AB (ref 14–54)
ANION GAP: 12 (ref 5–15)
AST: 23 U/L (ref 15–41)
Albumin: 4 g/dL (ref 3.5–5.0)
Alkaline Phosphatase: 81 U/L (ref 38–126)
BUN: 13 mg/dL (ref 6–20)
CHLORIDE: 103 mmol/L (ref 101–111)
CO2: 22 mmol/L (ref 22–32)
Calcium: 9.2 mg/dL (ref 8.9–10.3)
Creatinine, Ser: 0.95 mg/dL (ref 0.44–1.00)
GFR calc non Af Amer: 60 mL/min — ABNORMAL LOW (ref >60)
Glucose, Bld: 93 mg/dL (ref 65–99)
Potassium: 4.3 mmol/L (ref 3.5–5.1)
SODIUM: 137 mmol/L (ref 135–145)
Total Bilirubin: 0.5 mg/dL (ref 0.3–1.2)
Total Protein: 7.3 g/dL (ref 6.5–8.1)

## 2018-02-22 MED ORDER — CHLORHEXIDINE GLUCONATE CLOTH 2 % EX PADS: 6.0000 | MEDICATED_PAD | Freq: Once | CUTANEOUS | Status: DC

## 2018-02-24 ENCOUNTER — Other Ambulatory Visit: Payer: Self-pay

## 2018-02-24 ENCOUNTER — Ambulatory Visit (HOSPITAL_COMMUNITY): Payer: Medicare HMO

## 2018-02-24 ENCOUNTER — Encounter (HOSPITAL_COMMUNITY): Payer: Self-pay

## 2018-02-24 ENCOUNTER — Encounter (HOSPITAL_COMMUNITY)
Admission: RE | Disposition: A | Discharge status: Home / Self Care | Payer: Self-pay | Source: Ambulatory Visit | Attending: Surgery

## 2018-02-24 ENCOUNTER — Ambulatory Visit (HOSPITAL_COMMUNITY): Payer: Medicare HMO | Admitting: Anesthesiology

## 2018-02-24 ENCOUNTER — Ambulatory Visit (HOSPITAL_COMMUNITY)
Admission: RE | Admit: 2018-02-24 | Discharge: 2018-02-24 | Disposition: A | Discharge status: Home / Self Care | Payer: Medicare HMO | Source: Ambulatory Visit | Attending: Surgery | Admitting: Surgery

## 2018-02-24 DIAGNOSIS — Z95828 Presence of other vascular implants and grafts: Secondary | ICD-10-CM

## 2018-02-24 DIAGNOSIS — Z170 Estrogen receptor positive status [ER+]: Secondary | ICD-10-CM | POA: Insufficient documentation

## 2018-02-24 DIAGNOSIS — F419 Anxiety disorder, unspecified: Secondary | ICD-10-CM | POA: Diagnosis not present

## 2018-02-24 DIAGNOSIS — Z8719 Personal history of other diseases of the digestive system: Secondary | ICD-10-CM | POA: Insufficient documentation

## 2018-02-24 DIAGNOSIS — I10 Essential (primary) hypertension: Secondary | ICD-10-CM | POA: Diagnosis not present

## 2018-02-24 DIAGNOSIS — C50912 Malignant neoplasm of unspecified site of left female breast: Secondary | ICD-10-CM | POA: Diagnosis not present

## 2018-02-24 DIAGNOSIS — N951 Menopausal and female climacteric states: Secondary | ICD-10-CM | POA: Insufficient documentation

## 2018-02-24 DIAGNOSIS — Z419 Encounter for procedure for purposes other than remedying health state, unspecified: Secondary | ICD-10-CM

## 2018-02-24 DIAGNOSIS — K219 Gastro-esophageal reflux disease without esophagitis: Secondary | ICD-10-CM | POA: Diagnosis not present

## 2018-02-24 DIAGNOSIS — I252 Old myocardial infarction: Secondary | ICD-10-CM | POA: Insufficient documentation

## 2018-02-24 DIAGNOSIS — F17200 Nicotine dependence, unspecified, uncomplicated: Secondary | ICD-10-CM | POA: Insufficient documentation

## 2018-02-24 DIAGNOSIS — M1990 Unspecified osteoarthritis, unspecified site: Secondary | ICD-10-CM | POA: Insufficient documentation

## 2018-02-24 DIAGNOSIS — K759 Inflammatory liver disease, unspecified: Secondary | ICD-10-CM | POA: Insufficient documentation

## 2018-02-24 HISTORY — PX: PORTACATH PLACEMENT: SHX2246

## 2018-02-24 SURGERY — INSERTION, TUNNELED CENTRAL VENOUS DEVICE, WITH PORT
Anesthesia: General | Site: Chest | Laterality: Right

## 2018-02-24 MED ORDER — FENTANYL CITRATE (PF) 250 MCG/5ML IJ SOLN
INTRAMUSCULAR | Status: AC
  Filled 2018-02-24: qty 5

## 2018-02-24 MED ORDER — DEXAMETHASONE SODIUM PHOSPHATE 10 MG/ML IJ SOLN
INTRAMUSCULAR | Status: AC
  Filled 2018-02-24: qty 1

## 2018-02-24 MED ORDER — HEPARIN SODIUM (PORCINE) 5000 UNIT/ML IJ SOLN
INTRAMUSCULAR | Status: AC
  Filled 2018-02-24: qty 1.2

## 2018-02-24 MED ORDER — ACETAMINOPHEN 500 MG PO TABS
1000.0000 mg | ORAL_TABLET | ORAL | Status: AC
  Administered 2018-02-24: 1000 mg via ORAL
  Filled 2018-02-24: qty 2

## 2018-02-24 MED ORDER — PROPOFOL 10 MG/ML IV BOLUS
INTRAVENOUS | Status: DC | PRN
  Administered 2018-02-24: 100 mg via INTRAVENOUS

## 2018-02-24 MED ORDER — BUPIVACAINE-EPINEPHRINE 0.25% -1:200000 IJ SOLN
INTRAMUSCULAR | Status: DC | PRN
  Administered 2018-02-24: 10 mL

## 2018-02-24 MED ORDER — GABAPENTIN 300 MG PO CAPS
300.0000 mg | ORAL_CAPSULE | ORAL | Status: AC
  Administered 2018-02-24: 300 mg via ORAL
  Filled 2018-02-24: qty 1

## 2018-02-24 MED ORDER — EPHEDRINE SULFATE-NACL 50-0.9 MG/10ML-% IV SOSY
PREFILLED_SYRINGE | INTRAVENOUS | Status: DC | PRN
  Administered 2018-02-24 (×2): 10 mg via INTRAVENOUS
  Administered 2018-02-24 (×4): 5 mg via INTRAVENOUS
  Administered 2018-02-24: 10 mg via INTRAVENOUS

## 2018-02-24 MED ORDER — IBUPROFEN 800 MG PO TABS: 800.0000 mg | ORAL_TABLET | Freq: Three times a day (TID) | ORAL | 0 refills | Status: DC | PRN

## 2018-02-24 MED ORDER — ONDANSETRON HCL 4 MG/2ML IJ SOLN
INTRAMUSCULAR | Status: DC | PRN
  Administered 2018-02-24: 4 mg via INTRAVENOUS

## 2018-02-24 MED ORDER — SUCCINYLCHOLINE CHLORIDE 200 MG/10ML IV SOSY
PREFILLED_SYRINGE | INTRAVENOUS | Status: AC
  Filled 2018-02-24: qty 10

## 2018-02-24 MED ORDER — HEPARIN SOD (PORK) LOCK FLUSH 100 UNIT/ML IV SOLN
INTRAVENOUS | Status: AC
  Filled 2018-02-24: qty 5

## 2018-02-24 MED ORDER — BUPIVACAINE-EPINEPHRINE (PF) 0.25% -1:200000 IJ SOLN
INTRAMUSCULAR | Status: AC
  Filled 2018-02-24: qty 30

## 2018-02-24 MED ORDER — SODIUM CHLORIDE 0.9 % IV SOLN
INTRAVENOUS | Status: DC | PRN
  Administered 2018-02-24: 11:00:00

## 2018-02-24 MED ORDER — ONDANSETRON HCL 4 MG/2ML IJ SOLN
INTRAMUSCULAR | Status: AC
  Filled 2018-02-24: qty 4

## 2018-02-24 MED ORDER — DEXAMETHASONE SODIUM PHOSPHATE 10 MG/ML IJ SOLN
INTRAMUSCULAR | Status: DC | PRN
  Administered 2018-02-24: 8 mg via INTRAVENOUS

## 2018-02-24 MED ORDER — MIDAZOLAM HCL 2 MG/2ML IJ SOLN
INTRAMUSCULAR | Status: AC
  Filled 2018-02-24: qty 2

## 2018-02-24 MED ORDER — CELECOXIB 200 MG PO CAPS
200.0000 mg | ORAL_CAPSULE | ORAL | Status: AC
  Administered 2018-02-24: 200 mg via ORAL
  Filled 2018-02-24: qty 1

## 2018-02-24 MED ORDER — 0.9 % SODIUM CHLORIDE (POUR BTL) OPTIME
TOPICAL | Status: DC | PRN
  Administered 2018-02-24: 1000 mL

## 2018-02-24 MED ORDER — DEXTROSE 5 % IV SOLN
3.0000 g | INTRAVENOUS | Status: AC
  Administered 2018-02-24: 3 g via INTRAVENOUS
  Filled 2018-02-24: qty 3

## 2018-02-24 MED ORDER — PHENYLEPHRINE 40 MCG/ML (10ML) SYRINGE FOR IV PUSH (FOR BLOOD PRESSURE SUPPORT)
PREFILLED_SYRINGE | INTRAVENOUS | Status: AC
  Filled 2018-02-24: qty 10

## 2018-02-24 MED ORDER — EPHEDRINE 5 MG/ML INJ
INTRAVENOUS | Status: AC
  Filled 2018-02-24: qty 20

## 2018-02-24 MED ORDER — LIDOCAINE 2% (20 MG/ML) 5 ML SYRINGE
INTRAMUSCULAR | Status: DC | PRN
  Administered 2018-02-24: 60 mg via INTRAVENOUS

## 2018-02-24 MED ORDER — ROCURONIUM BROMIDE 10 MG/ML (PF) SYRINGE
PREFILLED_SYRINGE | INTRAVENOUS | Status: AC
  Filled 2018-02-24: qty 5

## 2018-02-24 MED ORDER — PROPOFOL 10 MG/ML IV BOLUS
INTRAVENOUS | Status: AC
  Filled 2018-02-24: qty 20

## 2018-02-24 MED ORDER — LIDOCAINE HCL (CARDIAC) 20 MG/ML IV SOLN
INTRAVENOUS | Status: AC
  Filled 2018-02-24: qty 5

## 2018-02-24 MED ORDER — FENTANYL CITRATE (PF) 100 MCG/2ML IJ SOLN: 25.0000 ug | INTRAMUSCULAR | Status: DC | PRN

## 2018-02-24 MED ORDER — ROCURONIUM BROMIDE 10 MG/ML (PF) SYRINGE
PREFILLED_SYRINGE | INTRAVENOUS | Status: DC | PRN
  Administered 2018-02-24: 30 mg via INTRAVENOUS

## 2018-02-24 MED ORDER — SUGAMMADEX SODIUM 200 MG/2ML IV SOLN
INTRAVENOUS | Status: DC | PRN
  Administered 2018-02-24: 140 mg via INTRAVENOUS

## 2018-02-24 MED ORDER — PHENYLEPHRINE 40 MCG/ML (10ML) SYRINGE FOR IV PUSH (FOR BLOOD PRESSURE SUPPORT)
PREFILLED_SYRINGE | INTRAVENOUS | Status: DC | PRN
  Administered 2018-02-24 (×3): 120 ug via INTRAVENOUS

## 2018-02-24 MED ORDER — OXYCODONE HCL 5 MG PO TABS: 5.0000 mg | ORAL_TABLET | Freq: Four times a day (QID) | ORAL | 0 refills | Status: DC | PRN

## 2018-02-24 MED ORDER — LACTATED RINGERS IV SOLN
INTRAVENOUS | Status: DC | PRN
  Administered 2018-02-24: 10:00:00 via INTRAVENOUS

## 2018-02-24 MED ORDER — HEPARIN SOD (PORK) LOCK FLUSH 100 UNIT/ML IV SOLN
INTRAVENOUS | Status: DC | PRN
  Administered 2018-02-24: 500 [IU]

## 2018-02-24 MED ORDER — FENTANYL CITRATE (PF) 250 MCG/5ML IJ SOLN
INTRAMUSCULAR | Status: DC | PRN
  Administered 2018-02-24: 50 ug via INTRAVENOUS

## 2018-02-24 MED ORDER — ONDANSETRON HCL 4 MG/2ML IJ SOLN: 4.0000 mg | Freq: Once | INTRAMUSCULAR | Status: DC | PRN

## 2018-02-24 SURGICAL SUPPLY — 44 items
ADH SKN CLS APL DERMABOND .7 (GAUZE/BANDAGES/DRESSINGS) ×1
BAG DECANTER FOR FLEXI CONT (MISCELLANEOUS) ×3 IMPLANT
BLADE SURG 11 STRL SS (BLADE) ×3 IMPLANT
BLADE SURG 15 STRL LF DISP TIS (BLADE) ×1 IMPLANT
BLADE SURG 15 STRL SS (BLADE) ×3
CHLORAPREP W/TINT 26ML (MISCELLANEOUS) ×3 IMPLANT
COVER SURGICAL LIGHT HANDLE (MISCELLANEOUS) ×3 IMPLANT
COVER TRANSDUCER ULTRASND GEL (DRAPE) ×3 IMPLANT
CRADLE DONUT ADULT HEAD (MISCELLANEOUS) ×3 IMPLANT
DERMABOND ADVANCED (GAUZE/BANDAGES/DRESSINGS) ×2
DERMABOND ADVANCED .7 DNX12 (GAUZE/BANDAGES/DRESSINGS) ×1 IMPLANT
DRAPE C-ARM 42X72 X-RAY (DRAPES) ×3 IMPLANT
DRAPE LAPAROSCOPIC ABDOMINAL (DRAPES) ×3 IMPLANT
DRAPE UTILITY XL STRL (DRAPES) ×3 IMPLANT
ELECT CAUTERY BLADE 6.4 (BLADE) ×3 IMPLANT
ELECT REM PT RETURN 9FT ADLT (ELECTROSURGICAL) ×3
ELECTRODE REM PT RTRN 9FT ADLT (ELECTROSURGICAL) ×1 IMPLANT
GAUZE SPONGE 4X4 16PLY XRAY LF (GAUZE/BANDAGES/DRESSINGS) ×3 IMPLANT
GEL ULTRASOUND 20GR AQUASONIC (MISCELLANEOUS) IMPLANT
GLOVE BIO SURGEON STRL SZ8 (GLOVE) ×3 IMPLANT
GLOVE BIOGEL PI IND STRL 8 (GLOVE) ×1 IMPLANT
GLOVE BIOGEL PI INDICATOR 8 (GLOVE) ×2
GOWN STRL REUS W/ TWL LRG LVL3 (GOWN DISPOSABLE) ×1 IMPLANT
GOWN STRL REUS W/ TWL XL LVL3 (GOWN DISPOSABLE) ×1 IMPLANT
GOWN STRL REUS W/TWL LRG LVL3 (GOWN DISPOSABLE) ×3
GOWN STRL REUS W/TWL XL LVL3 (GOWN DISPOSABLE) ×3
KIT BASIN OR (CUSTOM PROCEDURE TRAY) ×3 IMPLANT
KIT PORT POWER 8FR ISP CVUE (Port) ×2 IMPLANT
KIT TURNOVER KIT B (KITS) ×3 IMPLANT
NDL HYPO 25GX1X1/2 BEV (NEEDLE) ×1 IMPLANT
NEEDLE HYPO 25GX1X1/2 BEV (NEEDLE) ×3 IMPLANT
NS IRRIG 1000ML POUR BTL (IV SOLUTION) ×3 IMPLANT
PACK SURGICAL SETUP 50X90 (CUSTOM PROCEDURE TRAY) ×3 IMPLANT
PAD ARMBOARD 7.5X6 YLW CONV (MISCELLANEOUS) ×3 IMPLANT
PENCIL BUTTON HOLSTER BLD 10FT (ELECTRODE) ×3 IMPLANT
SUT MNCRL AB 4-0 PS2 18 (SUTURE) ×3 IMPLANT
SUT PROLENE 2 0 SH 30 (SUTURE) ×3 IMPLANT
SUT VIC AB 3-0 SH 27 (SUTURE) ×3
SUT VIC AB 3-0 SH 27X BRD (SUTURE) ×1 IMPLANT
SYR 20ML ECCENTRIC (SYRINGE) ×6 IMPLANT
SYR 5ML LUER SLIP (SYRINGE) ×3 IMPLANT
SYR CONTROL 10ML LL (SYRINGE) ×3 IMPLANT
TOWEL OR 17X24 6PK STRL BLUE (TOWEL DISPOSABLE) ×3 IMPLANT
TOWEL OR 17X26 10 PK STRL BLUE (TOWEL DISPOSABLE) ×3 IMPLANT

## 2018-02-24 NOTE — Transfer of Care (Signed)
Immediate Anesthesia Transfer of Care Note  Patient: [name]  Procedure(s) Performed: INSERTION OF RIGHT INTERNAL JUGULAR PORT-A-CATH WITH ULTRA SOUND GUIDANCE ERAS PATHWAY (Right Chest)  Patient Location: PACU  Anesthesia Type:General  Level of Consciousness: awake, alert  and oriented  Airway & Oxygen Therapy: Patient Spontanous Breathing  Post-op Assessment: Report given to RN, Post -op Vital signs reviewed and stable and Patient moving all extremities X 4  Post vital signs: Reviewed and stable  Last Vitals:  Vitals Value Taken Time  BP 124/59 02/24/2018 12:45 PM  Temp    Pulse 85 02/24/2018 12:46 PM  Resp 9 02/24/2018 12:46 PM  SpO2 96 % 02/24/2018 12:46 PM  Vitals shown include unvalidated device data.  Last Pain:  Vitals:   02/24/18 1016  TempSrc: Oral      Patients Stated Pain Goal: 3 (62/[id number]/38 [id number])  Complications: No apparent anesthesia complications

## 2018-02-24 NOTE — Interval H&P Note (Signed)
History and Physical Interval Note:  02/24/2018 11:09 AM  Karrie Meres  has presented today for surgery, with the diagnosis of poor venous access  The various methods of treatment have been discussed with the patient and family. After consideration of risks, benefits and other options for treatment, the patient has consented to  Procedure(s): [place] (N/A) as a surgical intervention .  The patient's history has been reviewed, patient examined, no change in status, stable for surgery.  I have reviewed the patient's chart and labs.  Questions were answered to the patient's satisfaction.     [place]

## 2018-02-24 NOTE — Anesthesia Preprocedure Evaluation (Addendum)
Anesthesia Evaluation  Patient identified by MRN, date of birth, ID band Patient awake    Reviewed: Allergy & Precautions, NPO status , Patient's Chart, lab work & pertinent test results  Airway Mallampati: II  TM Distance: >3 FB Neck ROM: Full    Dental  (+) Dental Advisory Given, Partial Upper   Pulmonary Current Smoker,    Pulmonary exam normal breath sounds clear to auscultation       Cardiovascular hypertension, Pt. on medications + Past MI  Normal cardiovascular exam Rhythm:Regular Rate:Normal     Neuro/Psych PSYCHIATRIC DISORDERS Anxiety negative neurological ROS     GI/Hepatic Neg liver ROS, GERD  Medicated and Poorly Controlled,  Endo/Other  negative endocrine ROS  Renal/GU negative Renal ROS     Musculoskeletal  (+) Arthritis ,   Abdominal   Peds  Hematology negative hematology ROS (+)   Anesthesia Other Findings Day of surgery medications reviewed with the patient.  Breast cancer  Reproductive/Obstetrics                            Anesthesia Physical Anesthesia Plan  ASA: III  Anesthesia Plan: General   Post-op Pain Management:    Induction: Intravenous  PONV Risk Score and Plan: 2 and Dexamethasone and Ondansetron  Airway Management Planned: Oral ETT  Additional Equipment:   Intra-op Plan:   Post-operative Plan: Extubation in OR  Informed Consent: I have reviewed the patients History and Physical, chart, labs and discussed the procedure including the risks, benefits and alternatives for the proposed anesthesia with the patient or authorized representative who has indicated his/her understanding and acceptance.   Dental advisory given  Plan Discussed with: CRNA  Anesthesia Plan Comments:        Anesthesia Quick Evaluation

## 2018-02-24 NOTE — Discharge Instructions (Signed)
    PORT-A-CATH: POST OP INSTRUCTIONS  Always review your discharge instruction sheet given to you by the facility where your surgery was performed.   1. A prescription for pain medication may be given to you upon discharge. Take your pain medication as prescribed, if needed. If narcotic pain medicine is not needed, then you make take acetaminophen (Tylenol) or ibuprofen (Advil) as needed.  2. Take your usually prescribed medications unless otherwise directed. 3. If you need a refill on your pain medication, please contact our office. All narcotic pain medicine now requires a paper prescription.  Phoned in and fax refills are no longer allowed by law.  Prescriptions will not be filled after 5 pm or on weekends.  4. You should follow a light diet for the remainder of the day after your procedure. 5. Most patients will experience some mild swelling and/or bruising in the area of the incision. It may take several days to resolve. 6. It is common to experience some constipation if taking pain medication after surgery. Increasing fluid intake and taking a stool softener (such as Colace) will usually help or prevent this problem from occurring. A mild laxative (Milk of Magnesia or Miralax) should be taken according to package directions if there are no bowel movements after 48 hours.  7. Unless discharge instructions indicate otherwise, you may remove your bandages 48 hours after surgery, and you may shower at that time. You may have steri-strips (small white skin tapes) in place directly over the incision.  These strips should be left on the skin for 7-10 days.  If your surgeon used Dermabond (skin glue) on the incision, you may shower in 24 hours.  The glue will flake off over the next 2-3 weeks.  8. If your port is left accessed at the end of surgery (needle left in port), the dressing cannot get wet and should only by changed by a healthcare professional. When the port is no longer accessed (when the  needle has been removed), follow step 7.   9. ACTIVITIES:  Limit activity involving your arms for the next 72 hours. Do no strenuous exercise or activity for 1 week. You may drive when you are no longer taking prescription pain medication, you can comfortably wear a seatbelt, and you can maneuver your car. 10.You may need to see your doctor in the office for a follow-up appointment.  Please       check with your doctor.  11.When you receive a new Port-a-Cath, you will get a product guide and        ID card.  Please keep them in case you need them.  WHEN TO CALL YOUR DOCTOR ([phone]): 1. Fever over 101.0 2. Chills 3. Continued bleeding from incision 4. Increased redness and tenderness at the site 5. Shortness of breath, difficulty breathing   The clinic staff is available to answer your questions during regular business hours. Please don't hesitate to call and ask to speak to one of the nurses or medical assistants for clinical concerns. If you have a medical emergency, go to the nearest emergency room or call 911.  A surgeon from [facility] is always on call at the hospital.     For further information, please visit [email or web address]      

## 2018-02-24 NOTE — Op Note (Signed)
Preoperative diagnosis: PAC needed  Postoperative diagnosis: Same  Procedure: Portacath Placement  With C arm and U/S guidance   Surgeon: Turner Daniels, MD, FACS  Anesthesia: General and 0.25 % marcaine with epinephrine  Clinical History and Indications: The patient is getting ready to begin chemotherapy for her cancer. She  needs a Port-A-Cath for venous access. Risk of bleeding, infection,  Collapse lung,  Death,  DVT,  Organ injury,  Mediastinal injury,  Injury to heart,  Injury to blood vessels,  Nerves,  Migration of catheter,  Embolization of catheter and the need for more surgery.  Description of Procedure: I have seen the patient in the holding area and confirmed the plans for the procedure as noted above. I reviewed the risks and complications again and the patient has no further questions. She wishes to proceed.   The patient was then taken to the operating room. After satisfactory general  anesthesia had been obtained the upper chest and lower neck were prepped and draped as a sterile field. The timeout was done.   A 8 F Clear Vue port was assembled.    The right internal jugular vein  was entered under U/S guidance  and the guidewire threaded into the superior vena cava right atrial area under fluoroscopic guidance. An incision was then made on the anterior chest wall and a subcutaneous pocket fashioned for the port reservoir.  The port tubing was then brought through a subcutaneous tunnel from the port site to the guidewire site.  The port and catheter were attached, locked  and flushed. The catheter was measured and cut to appropriate length.The dilator and peel-away sheath were then advanced over the guidewire while monitoring this with fluoroscopy. The guidewire and dilator were removed and the tubing threaded to approximately 20 cm. The peel-away sheath was then removed. The catheter aspirated and flushed easily. Using fluoroscopy the tip was in the superior vena cava right  atrial junction area. It aspirated and flushed easily. That aspirated and flushed easily.  The reservoir was secured to the fascia with 1 sutures of 2-0 Prolene. A final check with fluoroscopy was done to make sure we had no kinks and good positioning of the tip of the catheter. Everything appeared to be okay. The catheter was aspirated, flushed with dilute heparin and then concentrated aqueous heparin.  The incision was then closed with interrupted 3-0 Vicryl, and 4-0 Monocryl subcuticular with Dermabond on the skin.  There were no operative complications. Estimated blood loss was minimal. All counts were correct. The patient tolerated the procedure well.  Turner Daniels, MD, FACS

## 2018-02-24 NOTE — Anesthesia Procedure Notes (Addendum)
Date/Time: 02/24/2018 11:27 AM Performed by: Harden Mo, CRNA Pre-anesthesia Checklist: Patient identified, Emergency Drugs available, Suction available, Patient being monitored and Timeout performed Patient Re-evaluated:Patient Re-evaluated prior to induction Oxygen Delivery Method: Circle system utilized Preoxygenation: Pre-oxygenation with 100% oxygen Induction Type: IV induction Ventilation: Mask ventilation without difficulty Laryngoscope Size: Miller and 2 Grade View: Grade I Tube type: Oral Tube size: 7.0 mm Number of attempts: 1 Airway Equipment and Method: Stylet Placement Confirmation: ETT inserted through vocal cords under direct vision,  positive ETCO2,  CO2 detector and breath sounds checked- equal and bilateral Secured at: 21 cm Tube secured with: Tape Dental Injury: Teeth and Oropharynx as per pre-operative assessment

## 2018-02-25 ENCOUNTER — Encounter (HOSPITAL_COMMUNITY): Payer: Self-pay | Admitting: Surgery

## 2018-02-25 ENCOUNTER — Other Ambulatory Visit: Payer: Self-pay | Admitting: Hematology and Oncology

## 2018-02-25 NOTE — Anesthesia Postprocedure Evaluation (Signed)
Anesthesia Post Note  Patient: [name]  Procedure(s) Performed: INSERTION OF RIGHT INTERNAL JUGULAR PORT-A-CATH WITH ULTRA SOUND GUIDANCE ERAS PATHWAY (Right Chest)     Patient location during evaluation: PACU Anesthesia Type: General Level of consciousness: awake and alert Pain management: pain level controlled Vital Signs Assessment: post-procedure vital signs reviewed and stable Respiratory status: spontaneous breathing, nonlabored ventilation and respiratory function stable Cardiovascular status: blood pressure returned to baseline and stable Postop Assessment: no apparent nausea or vomiting Anesthetic complications: no    Last Vitals:  Vitals:   02/24/18 1325 02/24/18 1330  BP: 123/63 121/63  Pulse: 70 69  Resp: 20 14  Temp: (!) 36.3 C   SpO2: 95% 97%    Last Pain:  Vitals:   02/24/18 1315  TempSrc:   PainSc: 0-No pain                 Catalina Gravel

## 2018-02-26 ENCOUNTER — Inpatient Hospital Stay (HOSPITAL_BASED_OUTPATIENT_CLINIC_OR_DEPARTMENT_OTHER): Payer: Medicare HMO | Admitting: Hematology and Oncology

## 2018-02-26 ENCOUNTER — Ambulatory Visit: Payer: Medicare HMO

## 2018-02-26 ENCOUNTER — Inpatient Hospital Stay: Payer: Medicare HMO

## 2018-02-26 ENCOUNTER — Encounter: Payer: Self-pay | Admitting: *Deleted

## 2018-02-26 VITALS — BP 134/72 | HR 59 | Temp 99.0°F | Resp 17

## 2018-02-26 DIAGNOSIS — C50212 Malignant neoplasm of upper-inner quadrant of left female breast: Secondary | ICD-10-CM

## 2018-02-26 DIAGNOSIS — Z170 Estrogen receptor positive status [ER+]: Secondary | ICD-10-CM | POA: Diagnosis not present

## 2018-02-26 DIAGNOSIS — F419 Anxiety disorder, unspecified: Secondary | ICD-10-CM

## 2018-02-26 DIAGNOSIS — I10 Essential (primary) hypertension: Secondary | ICD-10-CM | POA: Diagnosis not present

## 2018-02-26 DIAGNOSIS — Z5112 Encounter for antineoplastic immunotherapy: Secondary | ICD-10-CM

## 2018-02-26 DIAGNOSIS — Z800 Family history of malignant neoplasm of digestive organs: Secondary | ICD-10-CM

## 2018-02-26 DIAGNOSIS — M129 Arthropathy, unspecified: Secondary | ICD-10-CM

## 2018-02-26 DIAGNOSIS — Z79899 Other long term (current) drug therapy: Secondary | ICD-10-CM

## 2018-02-26 DIAGNOSIS — F17210 Nicotine dependence, cigarettes, uncomplicated: Secondary | ICD-10-CM

## 2018-02-26 DIAGNOSIS — Z95828 Presence of other vascular implants and grafts: Secondary | ICD-10-CM

## 2018-02-26 LAB — CMP (CANCER CENTER ONLY)
ALBUMIN: 3.9 g/dL (ref 3.5–5.0)
ALT: 12 U/L (ref 0–55)
AST: 16 U/L (ref 5–34)
Alkaline Phosphatase: 81 U/L (ref 40–150)
Anion gap: 9 (ref 3–11)
BILIRUBIN TOTAL: 0.3 mg/dL (ref 0.2–1.2)
BUN: 19 mg/dL (ref 7–26)
CHLORIDE: 106 mmol/L (ref 98–109)
CO2: 25 mmol/L (ref 22–29)
CREATININE: 0.87 mg/dL (ref 0.60–1.10)
Calcium: 9.5 mg/dL (ref 8.4–10.4)
GFR, Est AFR Am: >60 mL/min (ref >60)
GLUCOSE: 87 mg/dL (ref 70–140)
Potassium: 4.2 mmol/L (ref 3.5–5.1)
Sodium: 140 mmol/L (ref 136–145)
Total Protein: 7.3 g/dL (ref 6.4–8.3)

## 2018-02-26 LAB — CBC WITH DIFFERENTIAL (CANCER CENTER ONLY)
BASOS ABS: 0 10*3/uL (ref 0.0–0.1)
BASOS PCT: 0 %
Eosinophils Absolute: 0 10*3/uL (ref 0.0–0.5)
Eosinophils Relative: 0 %
HEMATOCRIT: 35 % (ref 34.8–46.6)
Hemoglobin: 11.6 g/dL (ref 11.6–15.9)
LYMPHS PCT: 19 %
Lymphs Abs: 3.9 10*3/uL — ABNORMAL HIGH (ref 0.9–3.3)
MCH: 28.6 pg (ref 25.1–34.0)
MCHC: 33.1 g/dL (ref 31.5–36.0)
MCV: 86.2 fL (ref 79.5–101.0)
Monocytes Absolute: 1.8 10*3/uL — ABNORMAL HIGH (ref 0.1–0.9)
Monocytes Relative: 9 %
NEUTROS ABS: 14.8 10*3/uL — AB (ref 1.5–6.5)
Neutrophils Relative %: 72 %
Platelet Count: 383 10*3/uL (ref 145–400)
RBC: 4.06 MIL/uL (ref 3.70–5.45)
RDW: 13.8 % (ref 11.2–14.5)
WBC: 20.5 10*3/uL — AB (ref 3.9–10.3)

## 2018-02-26 MED ORDER — PALONOSETRON HCL INJECTION 0.25 MG/5ML
0.2500 mg | Freq: Once | INTRAVENOUS | Status: AC
  Administered 2018-02-26: 0.25 mg via INTRAVENOUS

## 2018-02-26 MED ORDER — FOSAPREPITANT DIMEGLUMINE INJECTION 150 MG
Freq: Once | INTRAVENOUS | Status: AC
  Administered 2018-02-26: 15:00:00 via INTRAVENOUS
  Filled 2018-02-26: qty 5

## 2018-02-26 MED ORDER — SODIUM CHLORIDE 0.9 % IV SOLN
75.0000 mg/m2 | Freq: Once | INTRAVENOUS | Status: AC
  Administered 2018-02-26: 130 mg via INTRAVENOUS
  Filled 2018-02-26: qty 13

## 2018-02-26 MED ORDER — SODIUM CHLORIDE 0.9 % IV SOLN
8.0000 mg/kg | Freq: Once | INTRAVENOUS | Status: AC
  Administered 2018-02-26: 525 mg via INTRAVENOUS
  Filled 2018-02-26: qty 25

## 2018-02-26 MED ORDER — PEGFILGRASTIM 6 MG/0.6ML ~~LOC~~ PSKT
6.0000 mg | PREFILLED_SYRINGE | Freq: Once | SUBCUTANEOUS | Status: AC
  Administered 2018-02-26: 6 mg via SUBCUTANEOUS

## 2018-02-26 MED ORDER — SODIUM CHLORIDE 0.9 % IV SOLN
322.8000 mg | Freq: Once | INTRAVENOUS | Status: AC
  Administered 2018-02-26: 320 mg via INTRAVENOUS
  Filled 2018-02-26: qty 32

## 2018-02-26 MED ORDER — ACETAMINOPHEN 325 MG PO TABS
650.0000 mg | ORAL_TABLET | Freq: Once | ORAL | Status: AC
  Administered 2018-02-26: 650 mg via ORAL

## 2018-02-26 MED ORDER — DIPHENHYDRAMINE HCL 25 MG PO CAPS
50.0000 mg | ORAL_CAPSULE | Freq: Once | ORAL | Status: AC
  Administered 2018-02-26: 50 mg via ORAL

## 2018-02-26 MED ORDER — SODIUM CHLORIDE 0.9 % IV SOLN
Freq: Once | INTRAVENOUS | Status: AC
Start: 2018-02-26 — End: 2018-02-26
  Administered 2018-02-26: 09:00:00 via INTRAVENOUS

## 2018-02-26 MED ORDER — SODIUM CHLORIDE 0.9% FLUSH
10.0000 mL | INTRAVENOUS | Status: DC | PRN
  Administered 2018-02-26: 10 mL
  Filled 2018-02-26: qty 10

## 2018-02-26 MED ORDER — SODIUM CHLORIDE 0.9% FLUSH
10.0000 mL | Freq: Once | INTRAVENOUS | Status: AC
  Administered 2018-02-26: 10 mL
  Filled 2018-02-26: qty 10

## 2018-02-26 MED ORDER — HEPARIN SOD (PORK) LOCK FLUSH 100 UNIT/ML IV SOLN
500.0000 [IU] | Freq: Once | INTRAVENOUS | Status: AC | PRN
  Administered 2018-02-26: 500 [IU]
  Filled 2018-02-26: qty 5

## 2018-02-26 MED ORDER — SODIUM CHLORIDE 0.9 % IV SOLN
840.0000 mg | Freq: Once | INTRAVENOUS | Status: AC
  Administered 2018-02-26: 840 mg via INTRAVENOUS
  Filled 2018-02-26: qty 28

## 2018-02-26 NOTE — Assessment & Plan Note (Signed)
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  Neoadjuvant chemotherapy with TCH Perjeta x6 cycles followed by Herceptin Perjeta maintenance for 1 year 2. followed by breast conserving surgery 3 followed by adjuvant radiation 4.  Followed by adjuvant antiestrogen therapy with letrozole for 5-7 years ------------------------------------------------------------------------------------------------------------------------ Current treatment: Cycle 1 day 1 TCHP  Antiemetics were reviewed Chemotherapy consent obtained Chemotherapy education completed Echocardiogram 02/16/2018: EF 60-65% Closely monitoring for chemotherapy toxicities. Return to clinic in one week for toxicity check

## 2018-02-26 NOTE — Progress Notes (Signed)
Patient Care Team: [name], MD as PCP - General (Internal Medicine)  DIAGNOSIS:  Encounter Diagnosis  Name Primary?  . Malignant neoplasm of upper-inner quadrant of left breast in female, estrogen receptor positive ([place])     SUMMARY OF ONCOLOGIC HISTORY:   Malignant neoplasm of upper-inner quadrant of left breast in female, estrogen receptor positive ([place])   02/03/2018 Initial Diagnosis    Left breast palpable lump at 1130 position 2.3 cm, at 11 o'clock position 6 mm which was benign; biopsy revealed IDC grade 3 ER 30%, PR 0%, Ki-67 30%, HER-2 positive ratio 5.62, copy #16.3, axillary lymph node biopsy benign concordant, T2 N0 stage II a clinical stage AJCC 8      02/18/2018 Breast MRI    Solitary mass in left breast upper outer quadrant 2.5 cm.  No additional enhancement in the right breast.  No adenopathy      02/26/2018 -  Neo-Adjuvant Chemotherapy    TCH Perjeta followed by Herceptin Perjeta maintenance       CHIEF COMPLIANT: Cycle 1 day 1 TCH Perjeta  INTERVAL HISTORY: [name] is a 72 year old with above-mentioned history of left breast cancer HER-2 positive disease who is here to receive her first cycle of [place].  She is very anxious to get started with the treatment.  Port has been placed.  Echocardiogram was normal.  REVIEW OF SYSTEMS:   Constitutional: Denies fevers, chills or abnormal weight loss Eyes: Denies blurriness of vision Ears, nose, mouth, throat, and face: Denies mucositis or sore throat Respiratory: Denies cough, dyspnea or wheezes Cardiovascular: Denies palpitation, chest discomfort Gastrointestinal:  Denies nausea, heartburn or change in bowel habits Skin: Denies abnormal skin rashes Lymphatics: Denies new lymphadenopathy or easy bruising Neurological:Denies numbness, tingling or new weaknesses Behavioral/Psych: Mood is stable, no new changes  Extremities: No lower extremity edema All other systems were reviewed with the patient  and are negative.  I have reviewed the past medical history, past surgical history, social history and family history with the patient and they are unchanged from previous note.  ALLERGIES:  is allergic to codeine and demerol [meperidine].  MEDICATIONS:  Current Outpatient Medications  Medication Sig Dispense Refill  . amLODipine (NORVASC) 10 MG tablet Take 10 mg by mouth daily.     Marland Kitchen buPROPion (WELLBUTRIN SR) 150 MG 12 hr tablet Start one week before quit date. Take 1 tab daily x 3 days, then 1 tab BID thereafter. (Patient taking differently: Take 150 mg by mouth 2 (two) times daily. Start one week before quit date. Take 1 tab daily x 3 days, then 1 tab BID thereafter.) 60 tablet 2  . dexamethasone (DECADRON) 4 MG tablet Take 1 tablet (4 mg total) by mouth daily. Take 1 tablet day before chemo and take 1 tablet day after chemo 12 tablet 0  . hydrochlorothiazide (HYDRODIURIL) 12.5 MG tablet Take 12.5 mg by mouth daily.     . hydrOXYzine (ATARAX/VISTARIL) 50 MG tablet Take 50 mg by mouth 3 (three) times daily as needed for anxiety.     Marland Kitchen ibuprofen (ADVIL,MOTRIN) 800 MG tablet Take 1 tablet (800 mg total) by mouth every 8 (eight) hours as needed. 30 tablet 0  . latanoprost (XALATAN) 0.005 % ophthalmic solution Place 1 drop into both eyes at bedtime.     . lidocaine-prilocaine (EMLA) cream Apply to affected area once 30 g 3  . LORazepam (ATIVAN) 0.5 MG tablet Take 1 tablet (0.5 mg total) by mouth at bedtime as needed for  sleep. 30 tablet 0  . nicotine (NICODERM CQ - DOSED IN MG/24 HOURS) 14 mg/24hr patch Place 1 patch (14 mg total) onto the skin daily. Apply 21 mg patch daily x 6 wk, then 56m patch daily x 2 wk, then 7 mg patch daily x 2 wk 14 patch 0  . nicotine (NICODERM CQ - DOSED IN MG/24 HOURS) 21 mg/24hr patch Place 1 patch (21 mg total) onto the skin daily. Apply 21 mg patch daily x 6 wk, then 152mpatch daily x 2 wk, then 7 mg patch daily x 2 wk 14 patch 2  . nicotine (NICODERM CQ - DOSED  IN MG/24 HR) 7 mg/24hr patch Place 1 patch (7 mg total) onto the skin daily. Apply 21 mg patch daily x 6 wk, then 1428match daily x 2 wk, then 7 mg patch daily x 2 wk 14 patch 0  . omeprazole (PRILOSEC) 20 MG capsule Take 20 mg by mouth 2 (two) times daily before a meal.     . ondansetron (ZOFRAN) 8 MG tablet Take 1 tablet (8 mg total) by mouth 2 (two) times daily as needed for refractory nausea / vomiting. 30 tablet 1  . oxyCODONE (OXY IR/ROXICODONE) 5 MG immediate release tablet Take 1 tablet (5 mg total) by mouth every 6 (six) hours as needed for severe pain. 10 tablet 0  . pramipexole (MIRAPEX) 0.25 MG tablet Take 0.25-1 mg by mouth at bedtime.     . prochlorperazine (COMPAZINE) 10 MG tablet Take 1 tablet (10 mg total) by mouth every 6 (six) hours as needed (Nausea or vomiting). 30 tablet 1   No current facility-administered medications for this visit.    Facility-Administered Medications Ordered in Other Visits  Medication Dose Route Frequency Provider Last Rate Last Dose  . CARBOplatin (PARAPLATIN) 320 mg in sodium chloride 0.9 % 250 mL chemo infusion  320 mg Intravenous Once GudNicholas LoseD      . DOCEtaxel (TAXOTERE) 130 mg in sodium chloride 0.9 % 250 mL chemo infusion  75 mg/m2 (Treatment Plan Recorded) Intravenous Once GudNicholas LoseD      . fosaprepitant (EMEND) 150 mg, dexamethasone (DECADRON) 4 mg in sodium chloride 0.9 % 145 mL IVPB   Intravenous Once GudNicholas LoseD      . heparin lock flush 100 unit/mL  500 Units Intracatheter Once PRN GudNicholas LoseD      . palonosetron (ALOXI) injection 0.25 mg  0.25 mg Intravenous Once GudNicholas LoseD      . pegfilgrastim (NEULASTA ONPRO KIT) injection 6 mg  6 mg Subcutaneous Once GudNicholas LoseD      . pertuzumab (PERJETA) 840 mg in sodium chloride 0.9 % 250 mL chemo infusion  840 mg Intravenous Once GudNicholas LoseD      . sodium chloride flush (NS) 0.9 % injection 10 mL  10 mL Intracatheter PRN GudNicholas LoseD      .  trastuzumab (HERCEPTIN) 525 mg in sodium chloride 0.9 % 250 mL chemo infusion  8 mg/kg (Treatment Plan Recorded) Intravenous Once GudNicholas LoseD        PHYSICAL EXAMINATION: ECOG PERFORMANCE STATUS: 1 - Symptomatic but completely ambulatory  Vitals:   02/26/18 0830  BP: (!) 152/91  Pulse: 60  Resp: 16  Temp: 97.8 F (36.6 C)  SpO2: 100%   Filed Weights   02/26/18 0830  Weight: 147 lb 14.4 oz (67.1 kg)    GENERAL:alert, no distress and comfortable SKIN: skin color, texture, turgor  are normal, no rashes or significant lesions EYES: normal, Conjunctiva are pink and non-injected, sclera clear OROPHARYNX:no exudate, no erythema and lips, buccal mucosa, and tongue normal  NECK: supple, thyroid normal size, non-tender, without nodularity LYMPH:  no palpable lymphadenopathy in the cervical, axillary or inguinal LUNGS: clear to auscultation and percussion with normal breathing effort HEART: regular rate & rhythm and no murmurs and no lower extremity edema ABDOMEN:abdomen soft, non-tender and normal bowel sounds MUSCULOSKELETAL:no cyanosis of digits and no clubbing  NEURO: alert & oriented x 3 with fluent speech, no focal motor/sensory deficits EXTREMITIES: No lower extremity edema  LABORATORY DATA:  I have reviewed the data as listed CMP Latest Ref Rng & Units 02/26/2018 02/22/2018 02/10/2018  Glucose 70 - 140 mg/dL 87 93 98  BUN 7 - 26 mg/dL _0 Creatinine 0.60 - 1.10 mg/dL 0.87 0.95 0.95  Sodium 136 - 145 mmol/L 140 137 138  Potassium 3.5 - 5.1 mmol/L 4.2 4.3 4.1  Chloride 98 - 109 mmol/L 106 103 104  CO2 22 - 29 mmol/L _1 Calcium 8.4 - 10.4 mg/dL 9.5 9.2 9.9  Total Protein 6.4 - 8.3 g/dL 7.3 7.3 8.0  Total Bilirubin 0.2 - 1.2 mg/dL 0.3 0.5 0.4  Alkaline Phos 40 - 150 U/L 81 81 88  AST 5 - 34 U/L _2 ALT 0 - 55 U/L 12 13(L) 11    Lab Results  Component Value Date   WBC 20.5 (H) 02/26/2018   HGB 12.2 02/22/2018   HCT 35.0 02/26/2018   MCV 86.2  02/26/2018   PLT 383 02/26/2018   NEUTROABS 14.8 (H) 02/26/2018    ASSESSMENT & PLAN:  Malignant neoplasm of upper-inner quadrant of left breast in female, estrogen receptor positive ([facility]) 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, copy #16.3, axillary lymph node biopsy benign concordant, T2 N0 stage II a clinical stage AJCC 8  Treatment plan: 1.  Neoadjuvant chemotherapy with TCH Perjeta x6 cycles followed by Herceptin Perjeta maintenance for 1 year 2. followed by breast conserving surgery 3 followed by adjuvant radiation 4.  Followed by adjuvant antiestrogen therapy with letrozole for 5-7 years ------------------------------------------------------------------------------------------------------------------------ Current treatment: Cycle 1 day 1 TCHP  Antiemetics were reviewed Chemotherapy consent obtained Chemotherapy education completed Echocardiogram 02/16/2018: EF 60-65% Closely monitoring for chemotherapy toxicities. Return to clinic in one week for toxicity check  No orders of the defined types were placed in this encounter.  The patient has a good understanding of the overall plan. she agrees with it. she will call with any problems that may develop before the next visit here.   Harriette Ohara, MD 02/26/18

## 2018-02-26 NOTE — Patient Instructions (Addendum)
[facility] Discharge Instructions for Patients Receiving Chemotherapy  Today you received the following chemotherapy agents Herceptin,perjeta,taxotere,carboplatin  To help prevent nausea and vomiting after your treatment, we encourage you to take your nausea medication as directed   If you develop nausea and vomiting that is not controlled by your nausea medication, call the clinic.   BELOW ARE SYMPTOMS THAT SHOULD BE REPORTED IMMEDIATELY:  *FEVER GREATER THAN 100.5 F  *CHILLS WITH OR WITHOUT FEVER  NAUSEA AND VOMITING THAT IS NOT CONTROLLED WITH YOUR NAUSEA MEDICATION  *UNUSUAL SHORTNESS OF BREATH  *UNUSUAL BRUISING OR BLEEDING  TENDERNESS IN MOUTH AND THROAT WITH OR WITHOUT PRESENCE OF ULCERS  *URINARY PROBLEMS  *BOWEL PROBLEMS  UNUSUAL RASH Items with * indicate a potential emergency and should be followed up as soon as possible.  Feel free to call the clinic should you have any questions or concerns. The clinic phone number is [phone].  Please show the [place] at check-in to the Emergency Department and triage nurse.   Trastuzumab injection for infusion What is this medicine? TRASTUZUMAB (tras TOO zoo mab) is a monoclonal antibody. It is used to treat breast cancer and stomach cancer. This medicine may be used for other purposes; ask your health care provider or pharmacist if you have questions. COMMON BRAND NAME(S): Herceptin What should I tell my health care provider before I take this medicine? They need to know if you have any of these conditions: -heart disease -heart failure -lung or breathing disease, like asthma -an unusual or allergic reaction to trastuzumab, benzyl alcohol, or other medications, foods, dyes, or preservatives -pregnant or trying to get pregnant -breast-feeding How should I use this medicine? This drug is given as an infusion into a vein. It is administered in a hospital or clinic by a specially trained  health care professional. Talk to your pediatrician regarding the use of this medicine in children. This medicine is not approved for use in children. Overdosage: If you think you have taken too much of this medicine contact a poison control center or emergency room at once. NOTE: This medicine is only for you. Do not share this medicine with others. What if I miss a dose? It is important not to miss a dose. Call your doctor or health care professional if you are unable to keep an appointment. What may interact with this medicine? This medicine may interact with the following medications: -certain types of chemotherapy, such as daunorubicin, doxorubicin, epirubicin, and idarubicin This list may not describe all possible interactions. Give your health care provider a list of all the medicines, herbs, non-prescription drugs, or dietary supplements you use. Also tell them if you smoke, drink alcohol, or use illegal drugs. Some items may interact with your medicine. What should I watch for while using this medicine? Visit your doctor for checks on your progress. Report any side effects. Continue your course of treatment even though you feel ill unless your doctor tells you to stop. Call your doctor or health care professional for advice if you get a fever, chills or sore throat, or other symptoms of a cold or flu. Do not treat yourself. Try to avoid being around people who are sick. You may experience fever, chills and shaking during your first infusion. These effects are usually mild and can be treated with other medicines. Report any side effects during the infusion to your health care professional. Fever and chills usually do not happen with later infusions. Do not become pregnant  while taking this medicine or for 7 months after stopping it. Women should inform their doctor if they wish to become pregnant or think they might be pregnant. Women of child-bearing potential will need to have a negative  pregnancy test before starting this medicine. There is a potential for serious side effects to an unborn child. Talk to your health care professional or pharmacist for more information. Do not breast-feed an infant while taking this medicine or for 7 months after stopping it. Women must use effective birth control with this medicine. What side effects may I notice from receiving this medicine? Side effects that you should report to your doctor or health care professional as soon as possible: -allergic reactions like skin rash, itching or hives, swelling of the face, lips, or tongue -chest pain or palpitations -cough -dizziness -feeling faint or lightheaded, falls -fever -general ill feeling or flu-like symptoms -signs of worsening heart failure like breathing problems; swelling in your legs and feet -unusually weak or tired Side effects that usually do not require medical attention (report to your doctor or health care professional if they continue or are bothersome): -bone pain -changes in taste -diarrhea -joint pain -nausea/vomiting -weight loss This list may not describe all possible side effects. Call your doctor for medical advice about side effects. You may report side effects to FDA at 1-800-FDA-1088. Where should I keep my medicine? This drug is given in a hospital or clinic and will not be stored at home. NOTE: This sheet is a summary. It may not cover all possible information. If you have questions about this medicine, talk to your doctor, pharmacist, or health care provider.  2018 Elsevier/Gold Standard (2016-11-11 14:37:52)   Pertuzumab injection What is this medicine? PERTUZUMAB (per TOOZ ue mab) is a monoclonal antibody. It is used to treat breast cancer. This medicine may be used for other purposes; ask your health care provider or pharmacist if you have questions. COMMON BRAND NAME(S): PERJETA What should I tell my health care provider before I take this  medicine? They need to know if you have any of these conditions: -heart disease -heart failure -high blood pressure -history of irregular heart beat -recent or ongoing radiation therapy -an unusual or allergic reaction to pertuzumab, other medicines, foods, dyes, or preservatives -pregnant or trying to get pregnant -breast-feeding How should I use this medicine? This medicine is for infusion into a vein. It is given by a health care professional in a hospital or clinic setting. Talk to your pediatrician regarding the use of this medicine in children. Special care may be needed. Overdosage: If you think you have taken too much of this medicine contact a poison control center or emergency room at once. NOTE: This medicine is only for you. Do not share this medicine with others. What if I miss a dose? It is important not to miss your dose. Call your doctor or health care professional if you are unable to keep an appointment. What may interact with this medicine? Interactions are not expected. Give your health care provider a list of all the medicines, herbs, non-prescription drugs, or dietary supplements you use. Also tell them if you smoke, drink alcohol, or use illegal drugs. Some items may interact with your medicine. This list may not describe all possible interactions. Give your health care provider a list of all the medicines, herbs, non-prescription drugs, or dietary supplements you use. Also tell them if you smoke, drink alcohol, or use illegal drugs. Some items may interact with  your medicine. What should I watch for while using this medicine? Your condition will be monitored carefully while you are receiving this medicine. Report any side effects. Continue your course of treatment even though you feel ill unless your doctor tells you to stop. Do not become pregnant while taking this medicine or for 7 months after stopping it. Women should inform their doctor if they wish to become  pregnant or think they might be pregnant. Women of child-bearing potential will need to have a negative pregnancy test before starting this medicine. There is a potential for serious side effects to an unborn child. Talk to your health care professional or pharmacist for more information. Do not breast-feed an infant while taking this medicine or for 7 months after stopping it. Women must use effective birth control with this medicine. Call your doctor or health care professional for advice if you get a fever, chills or sore throat, or other symptoms of a cold or flu. Do not treat yourself. Try to avoid being around people who are sick. You may experience fever, chills, and headache during the infusion. Report any side effects during the infusion to your health care professional. What side effects may I notice from receiving this medicine? Side effects that you should report to your doctor or health care professional as soon as possible: -breathing problems -chest pain or palpitations -dizziness -feeling faint or lightheaded -fever or chills -skin rash, itching or hives -sore throat -swelling of the face, lips, or tongue -swelling of the legs or ankles -unusually weak or tired Side effects that usually do not require medical attention (report to your doctor or health care professional if they continue or are bothersome): -diarrhea -hair loss -nausea, vomiting -tiredness This list may not describe all possible side effects. Call your doctor for medical advice about side effects. You may report side effects to FDA at 1-800-FDA-1088. Where should I keep my medicine? This drug is given in a hospital or clinic and will not be stored at home. NOTE: This sheet is a summary. It may not cover all possible information. If you have questions about this medicine, talk to your doctor, pharmacist, or health care provider.  2018 Elsevier/Gold Standard (2015-12-20 12:08:50)    Docetaxel injection What  is this medicine? DOCETAXEL (doe se TAX el) is a chemotherapy drug. It targets fast dividing cells, like cancer cells, and causes these cells to die. This medicine is used to treat many types of cancers like breast cancer, certain stomach cancers, head and neck cancer, lung cancer, and prostate cancer. This medicine may be used for other purposes; ask your health care provider or pharmacist if you have questions. COMMON BRAND NAME(S): Docefrez, Taxotere What should I tell my health care provider before I take this medicine? They need to know if you have any of these conditions: -infection (especially a virus infection such as chickenpox, cold sores, or herpes) -liver disease -low blood counts, like low white cell, platelet, or red cell counts -an unusual or allergic reaction to docetaxel, polysorbate 80, other chemotherapy agents, other medicines, foods, dyes, or preservatives -pregnant or trying to get pregnant -breast-feeding How should I use this medicine? This drug is given as an infusion into a vein. It is administered in a hospital or clinic by a specially trained health care professional. Talk to your pediatrician regarding the use of this medicine in children. Special care may be needed. Overdosage: If you think you have taken too much of this medicine contact a poison  control center or emergency room at once. NOTE: This medicine is only for you. Do not share this medicine with others. What if I miss a dose? It is important not to miss your dose. Call your doctor or health care professional if you are unable to keep an appointment. What may interact with this medicine? -cyclosporine -erythromycin -ketoconazole -medicines to increase blood counts like filgrastim, pegfilgrastim, sargramostim -vaccines Talk to your doctor or health care professional before taking any of these medicines: -acetaminophen -aspirin -ibuprofen -ketoprofen -naproxen This list may not describe all  possible interactions. Give your health care provider a list of all the medicines, herbs, non-prescription drugs, or dietary supplements you use. Also tell them if you smoke, drink alcohol, or use illegal drugs. Some items may interact with your medicine. What should I watch for while using this medicine? Your condition will be monitored carefully while you are receiving this medicine. You will need important blood work done while you are taking this medicine. This drug may make you feel generally unwell. This is not uncommon, as chemotherapy can affect healthy cells as well as cancer cells. Report any side effects. Continue your course of treatment even though you feel ill unless your doctor tells you to stop. In some cases, you may be given additional medicines to help with side effects. Follow all directions for their use. Call your doctor or health care professional for advice if you get a fever, chills or sore throat, or other symptoms of a cold or flu. Do not treat yourself. This drug decreases your body's ability to fight infections. Try to avoid being around people who are sick. This medicine may increase your risk to bruise or bleed. Call your doctor or health care professional if you notice any unusual bleeding. This medicine may contain alcohol in the product. You may get drowsy or dizzy. Do not drive, use machinery, or do anything that needs mental alertness until you know how this medicine affects you. Do not stand or sit up quickly, especially if you are an older patient. This reduces the risk of dizzy or fainting spells. Avoid alcoholic drinks. Do not become pregnant while taking this medicine. Women should inform their doctor if they wish to become pregnant or think they might be pregnant. There is a potential for serious side effects to an unborn child. Talk to your health care professional or pharmacist for more information. Do not breast-feed an infant while taking this medicine. What  side effects may I notice from receiving this medicine? Side effects that you should report to your doctor or health care professional as soon as possible: -allergic reactions like skin rash, itching or hives, swelling of the face, lips, or tongue -low blood counts - This drug may decrease the number of white blood cells, red blood cells and platelets. You may be at increased risk for infections and bleeding. -signs of infection - fever or chills, cough, sore throat, pain or difficulty passing urine -signs of decreased platelets or bleeding - bruising, pinpoint red spots on the skin, black, tarry stools, nosebleeds -signs of decreased red blood cells - unusually weak or tired, fainting spells, lightheadedness -breathing problems -fast or irregular heartbeat -low blood pressure -mouth sores -nausea and vomiting -pain, swelling, redness or irritation at the injection site -pain, tingling, numbness in the hands or feet -swelling of the ankle, feet, hands -weight gain Side effects that usually do not require medical attention (report to your doctor or health care professional if they continue or  are bothersome): -bone pain -complete hair loss including hair on your head, underarms, pubic hair, eyebrows, and eyelashes -diarrhea -excessive tearing -changes in the color of fingernails -loosening of the fingernails -nausea -muscle pain -red flush to skin -sweating -weak or tired This list may not describe all possible side effects. Call your doctor for medical advice about side effects. You may report side effects to FDA at 1-800-FDA-1088. Where should I keep my medicine? This drug is given in a hospital or clinic and will not be stored at home. NOTE: This sheet is a summary. It may not cover all possible information. If you have questions about this medicine, talk to your doctor, pharmacist, or health care provider.  2018 Elsevier/Gold Standard (2015-12-20 12:32:   Carboplatin  injection What is this medicine? CARBOPLATIN (KAR boe pla tin) is a chemotherapy drug. It targets fast dividing cells, like cancer cells, and causes these cells to die. This medicine is used to treat ovarian cancer and many other cancers. This medicine may be used for other purposes; ask your health care provider or pharmacist if you have questions. COMMON BRAND NAME(S): Paraplatin What should I tell my health care provider before I take this medicine? They need to know if you have any of these conditions: -blood disorders -hearing problems -kidney disease -recent or ongoing radiation therapy -an unusual or allergic reaction to carboplatin, cisplatin, other chemotherapy, other medicines, foods, dyes, or preservatives -pregnant or trying to get pregnant -breast-feeding How should I use this medicine? This drug is usually given as an infusion into a vein. It is administered in a hospital or clinic by a specially trained health care professional. Talk to your pediatrician regarding the use of this medicine in children. Special care may be needed. Overdosage: If you think you have taken too much of this medicine contact a poison control center or emergency room at once. NOTE: This medicine is only for you. Do not share this medicine with others. What if I miss a dose? It is important not to miss a dose. Call your doctor or health care professional if you are unable to keep an appointment. What may interact with this medicine? -medicines for seizures -medicines to increase blood counts like filgrastim, pegfilgrastim, sargramostim -some antibiotics like amikacin, gentamicin, neomycin, streptomycin, tobramycin -vaccines Talk to your doctor or health care professional before taking any of these medicines: -acetaminophen -aspirin -ibuprofen -ketoprofen -naproxen This list may not describe all possible interactions. Give your health care provider a list of all the medicines, herbs,  non-prescription drugs, or dietary supplements you use. Also tell them if you smoke, drink alcohol, or use illegal drugs. Some items may interact with your medicine. What should I watch for while using this medicine? Your condition will be monitored carefully while you are receiving this medicine. You will need important blood work done while you are taking this medicine. This drug may make you feel generally unwell. This is not uncommon, as chemotherapy can affect healthy cells as well as cancer cells. Report any side effects. Continue your course of treatment even though you feel ill unless your doctor tells you to stop. In some cases, you may be given additional medicines to help with side effects. Follow all directions for their use. Call your doctor or health care professional for advice if you get a fever, chills or sore throat, or other symptoms of a cold or flu. Do not treat yourself. This drug decreases your body's ability to fight infections. Try to avoid being around  people who are sick. This medicine may increase your risk to bruise or bleed. Call your doctor or health care professional if you notice any unusual bleeding. Be careful brushing and flossing your teeth or using a toothpick because you may get an infection or bleed more easily. If you have any dental work done, tell your dentist you are receiving this medicine. Avoid taking products that contain aspirin, acetaminophen, ibuprofen, naproxen, or ketoprofen unless instructed by your doctor. These medicines may hide a fever. Do not become pregnant while taking this medicine. Women should inform their doctor if they wish to become pregnant or think they might be pregnant. There is a potential for serious side effects to an unborn child. Talk to your health care professional or pharmacist for more information. Do not breast-feed an infant while taking this medicine. What side effects may I notice from receiving this medicine? Side effects  that you should report to your doctor or health care professional as soon as possible: -allergic reactions like skin rash, itching or hives, swelling of the face, lips, or tongue -signs of infection - fever or chills, cough, sore throat, pain or difficulty passing urine -signs of decreased platelets or bleeding - bruising, pinpoint red spots on the skin, black, tarry stools, nosebleeds -signs of decreased red blood cells - unusually weak or tired, fainting spells, lightheadedness -breathing problems -changes in hearing -changes in vision -chest pain -high blood pressure -low blood counts - This drug may decrease the number of white blood cells, red blood cells and platelets. You may be at increased risk for infections and bleeding. -nausea and vomiting -pain, swelling, redness or irritation at the injection site -pain, tingling, numbness in the hands or feet -problems with balance, talking, walking -trouble passing urine or change in the amount of urine Side effects that usually do not require medical attention (report to your doctor or health care professional if they continue or are bothersome): -hair loss -loss of appetite -metallic taste in the mouth or changes in taste This list may not describe all possible side effects. Call your doctor for medical advice about side effects. You may report side effects to FDA at 1-800-FDA-1088. Where should I keep my medicine? This drug is given in a hospital or clinic and will not be stored at home. NOTE: This sheet is a summary. It may not cover all possible information. If you have questions about this medicine, talk to your doctor, pharmacist, or health care provider.  2018 Elsevier/Gold Standard (2008-02-22 14:38:05)

## 2018-03-02 ENCOUNTER — Telehealth: Payer: Self-pay

## 2018-03-02 NOTE — Telephone Encounter (Signed)
Spoke with patient today regarding complaints of joint and bone pain that started since Sunday. Pt states that she had been taking claritin to prevent this d/t neulasta onpro. Pt is not having any relief as of today. Advised pt to add tylenol every 4-6hrs to claritin. May last up to 1 week for the soreness. Suggested that she increase her hydration intake as well. Pt doing well managing nausea, but appetite is decreased this time. No fevers, chills. Pt is feeling more fatigue today than the last 2 days. Told pt that all the symptoms that she is experiencing are expected and will improve over the next few days. Pt appreciated the call and has no further concerns. Confirmed follow up appt with Dr.Gudena on Thursday.

## 2018-03-03 ENCOUNTER — Encounter: Payer: Self-pay | Admitting: Hematology and Oncology

## 2018-03-03 NOTE — Progress Notes (Signed)
Called pt to introduce myself as her Arboriculturist.  Unfortunately there aren't any foundations offering copay assistance for her Dx and the type of ins she has.  I offered the [employer] and gave her the income requirement.  She would like to apply so she will bring her proof of income on 03/04/18.  If approved I will give her an expense sheet.

## 2018-03-04 ENCOUNTER — Inpatient Hospital Stay (HOSPITAL_BASED_OUTPATIENT_CLINIC_OR_DEPARTMENT_OTHER): Payer: Medicare HMO | Admitting: Hematology and Oncology

## 2018-03-04 ENCOUNTER — Encounter: Payer: Self-pay | Admitting: Hematology and Oncology

## 2018-03-04 ENCOUNTER — Inpatient Hospital Stay: Payer: Medicare HMO

## 2018-03-04 ENCOUNTER — Inpatient Hospital Stay: Payer: Medicare HMO | Attending: Hematology and Oncology

## 2018-03-04 VITALS — BP 124/61 | HR 86 | Temp 98.5°F | Resp 18

## 2018-03-04 DIAGNOSIS — Z5112 Encounter for antineoplastic immunotherapy: Secondary | ICD-10-CM | POA: Insufficient documentation

## 2018-03-04 DIAGNOSIS — R110 Nausea: Secondary | ICD-10-CM | POA: Diagnosis not present

## 2018-03-04 DIAGNOSIS — C50212 Malignant neoplasm of upper-inner quadrant of left female breast: Secondary | ICD-10-CM

## 2018-03-04 DIAGNOSIS — R52 Pain, unspecified: Secondary | ICD-10-CM | POA: Diagnosis not present

## 2018-03-04 DIAGNOSIS — Z79899 Other long term (current) drug therapy: Secondary | ICD-10-CM | POA: Diagnosis not present

## 2018-03-04 DIAGNOSIS — Z803 Family history of malignant neoplasm of breast: Secondary | ICD-10-CM | POA: Insufficient documentation

## 2018-03-04 DIAGNOSIS — R197 Diarrhea, unspecified: Secondary | ICD-10-CM

## 2018-03-04 DIAGNOSIS — Z170 Estrogen receptor positive status [ER+]: Secondary | ICD-10-CM

## 2018-03-04 DIAGNOSIS — G2581 Restless legs syndrome: Secondary | ICD-10-CM | POA: Diagnosis not present

## 2018-03-04 DIAGNOSIS — R21 Rash and other nonspecific skin eruption: Secondary | ICD-10-CM | POA: Insufficient documentation

## 2018-03-04 DIAGNOSIS — F17210 Nicotine dependence, cigarettes, uncomplicated: Secondary | ICD-10-CM | POA: Insufficient documentation

## 2018-03-04 DIAGNOSIS — Z800 Family history of malignant neoplasm of digestive organs: Secondary | ICD-10-CM | POA: Diagnosis not present

## 2018-03-04 DIAGNOSIS — K219 Gastro-esophageal reflux disease without esophagitis: Secondary | ICD-10-CM | POA: Insufficient documentation

## 2018-03-04 DIAGNOSIS — Z5111 Encounter for antineoplastic chemotherapy: Secondary | ICD-10-CM | POA: Insufficient documentation

## 2018-03-04 DIAGNOSIS — M542 Cervicalgia: Secondary | ICD-10-CM | POA: Diagnosis not present

## 2018-03-04 DIAGNOSIS — Z95828 Presence of other vascular implants and grafts: Secondary | ICD-10-CM

## 2018-03-04 DIAGNOSIS — R531 Weakness: Secondary | ICD-10-CM

## 2018-03-04 DIAGNOSIS — Z801 Family history of malignant neoplasm of trachea, bronchus and lung: Secondary | ICD-10-CM | POA: Insufficient documentation

## 2018-03-04 DIAGNOSIS — F419 Anxiety disorder, unspecified: Secondary | ICD-10-CM | POA: Insufficient documentation

## 2018-03-04 LAB — CMP (CANCER CENTER ONLY)
ALBUMIN: 3.8 g/dL (ref 3.5–5.0)
ALT: 17 U/L (ref 0–55)
AST: 19 U/L (ref 5–34)
Alkaline Phosphatase: 111 U/L (ref 40–150)
Anion gap: 9 (ref 3–11)
BUN: 18 mg/dL (ref 7–26)
CHLORIDE: 103 mmol/L (ref 98–109)
CO2: 24 mmol/L (ref 22–29)
CREATININE: 1 mg/dL (ref 0.60–1.10)
Calcium: 9.8 mg/dL (ref 8.4–10.4)
GFR, Estimated: 56 mL/min — ABNORMAL LOW (ref >60)
GLUCOSE: 114 mg/dL (ref 70–140)
Potassium: 4.5 mmol/L (ref 3.5–5.1)
SODIUM: 136 mmol/L (ref 136–145)
Total Bilirubin: 0.5 mg/dL (ref 0.2–1.2)
Total Protein: 7.7 g/dL (ref 6.4–8.3)

## 2018-03-04 LAB — CBC WITH DIFFERENTIAL (CANCER CENTER ONLY)
Basophils Absolute: 0.2 10*3/uL — ABNORMAL HIGH (ref 0.0–0.1)
Basophils Relative: 1 %
EOS ABS: 0.1 10*3/uL (ref 0.0–0.5)
Eosinophils Relative: 1 %
HCT: 37.4 % (ref 34.8–46.6)
HEMOGLOBIN: 12.4 g/dL (ref 11.6–15.9)
LYMPHS ABS: 2.5 10*3/uL (ref 0.9–3.3)
Lymphocytes Relative: 24 %
MCH: 28.5 pg (ref 25.1–34.0)
MCHC: 33.2 g/dL (ref 31.5–36.0)
MCV: 86 fL (ref 79.5–101.0)
MONO ABS: 0.6 10*3/uL (ref 0.1–0.9)
MONOS PCT: 5 %
NEUTROS PCT: 69 %
Neutro Abs: 7.3 10*3/uL — ABNORMAL HIGH (ref 1.5–6.5)
Platelet Count: 296 10*3/uL (ref 145–400)
RBC: 4.35 MIL/uL (ref 3.70–5.45)
RDW: 13.2 % (ref 11.2–14.5)
WBC Count: 10.6 10*3/uL — ABNORMAL HIGH (ref 3.9–10.3)

## 2018-03-04 MED ORDER — HEPARIN SOD (PORK) LOCK FLUSH 100 UNIT/ML IV SOLN
500.0000 [IU] | Freq: Once | INTRAVENOUS | Status: AC
  Administered 2018-03-04: 500 [IU]
  Filled 2018-03-04: qty 5

## 2018-03-04 MED ORDER — SODIUM CHLORIDE 0.9 % IV SOLN
Freq: Once | INTRAVENOUS | Status: AC
  Administered 2018-03-04: 11:00:00 via INTRAVENOUS

## 2018-03-04 MED ORDER — SODIUM CHLORIDE 0.9% FLUSH
10.0000 mL | Freq: Once | INTRAVENOUS | Status: DC
  Filled 2018-03-04: qty 10

## 2018-03-04 MED ORDER — DIPHENOXYLATE-ATROPINE 2.5-0.025 MG PO TABS: 1.0000 | ORAL_TABLET | Freq: Four times a day (QID) | ORAL | 3 refills | Status: DC | PRN

## 2018-03-04 MED ORDER — HEPARIN SOD (PORK) LOCK FLUSH 100 UNIT/ML IV SOLN
500.0000 [IU] | Freq: Once | INTRAVENOUS | Status: DC
  Filled 2018-03-04: qty 5

## 2018-03-04 MED ORDER — CLOTRIMAZOLE-BETAMETHASONE 1-0.05 % EX CREA: 1.0000 "application " | TOPICAL_CREAM | Freq: Two times a day (BID) | CUTANEOUS | 0 refills | Status: DC

## 2018-03-04 MED ORDER — SODIUM CHLORIDE 0.9% FLUSH
10.0000 mL | Freq: Once | INTRAVENOUS | Status: AC
  Administered 2018-03-04: 10 mL
  Filled 2018-03-04: qty 10

## 2018-03-04 MED FILL — DIPHENOXYLATE-ATROPINE TAB: 2.5-0.025 | 15 days supply | Qty: 60 | Fill #0

## 2018-03-04 MED FILL — CLOTRIMAZOLE-BETAMETHASONE: 1-0.05 | 15 days supply | Qty: 30 | Fill #0

## 2018-03-04 NOTE — Assessment & Plan Note (Signed)
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, copy #16.3, axillary lymph node biopsy benign concordant, T2 N0 stage II a clinical stage AJCC 8  Treatment plan: 1.Neoadjuvant chemotherapy with TCH Perjeta x6 cycles followed by Herceptin Perjeta maintenance for 1 year 2.followed by breast conserving surgery 47fllowed by adjuvant radiation 4.Followed by adjuvant antiestrogen therapy with letrozole for 5-7 years ------------------------------------------------------------------------------------------------------------------------ Current treatment: Cycle 1 day 8 TCHP Chemo Toxicities:  Labs have been reviewed Monitoring closely for chemo toxicities Return to clinic in 2 weeks for cycle 2

## 2018-03-04 NOTE — Patient Instructions (Addendum)
Dehydration, Adult Dehydration is when there is not enough fluid or water in your body. This happens when you lose more fluids than you take in. Dehydration can range from mild to very bad. It should be treated right away to keep it from getting very bad. Symptoms of mild dehydration may include:  Thirst.  Dry lips.  Slightly dry mouth.  Dry, warm skin.  Dizziness. Symptoms of moderate dehydration may include:  Very dry mouth.  Muscle cramps.  Dark pee (urine). Pee may be the color of tea.  Your body making less pee.  Your eyes making fewer tears.  Heartbeat that is uneven or faster than normal (palpitations).  Headache.  Light-headedness, especially when you stand up from sitting.  Fainting (syncope). Symptoms of very bad dehydration may include:  Changes in skin, such as: ? Cold and clammy skin. ? Blotchy (mottled) or pale skin. ? Skin that does not quickly return to normal after being lightly pinched and let go (poor skin turgor).  Changes in body fluids, such as: ? Feeling very thirsty. ? Your eyes making fewer tears. ? Not sweating when body temperature is high, such as in hot weather. ? Your body making very little pee.  Changes in vital signs, such as: ? Weak pulse. ? Pulse that is more than 100 beats a minute when you are sitting still. ? Fast breathing. ? Low blood pressure.  Other changes, such as: ? Sunken eyes. ? Cold hands and feet. ? Confusion. ? Lack of energy (lethargy). ? Trouble waking up from sleep. ? Short-term weight loss. ? Unconsciousness. Follow these instructions at home:  If told by your doctor, drink an ORS: ? Make an ORS by using instructions on the package. ? Start by drinking small amounts, about  cup (120 mL) every 5-10 minutes. ? Slowly drink more until you have had the amount that your doctor said to have.  Drink enough clear fluid to keep your pee clear or pale yellow. If you were told to drink an ORS, finish the ORS  first, then start slowly drinking clear fluids. Drink fluids such as: ? Water. Do not drink only water by itself. Doing that can make the salt (sodium) level in your body get too low (hyponatremia). ? Ice chips. ? Fruit juice that you have added water to (diluted). ? Low-calorie sports drinks.  Avoid: ? Alcohol. ? Drinks that have a lot of sugar. These include high-calorie sports drinks, fruit juice that does not have water added, and soda. ? Caffeine. ? Foods that are greasy or have a lot of fat or sugar.  Take over-the-counter and prescription medicines only as told by your doctor.  Do not take salt tablets. Doing that can make the salt level in your body get too high (hypernatremia).  Eat foods that have minerals (electrolytes). Examples include bananas, oranges, potatoes, tomatoes, and spinach.  Keep all follow-up visits as told by your doctor. This is important. Contact a doctor if:  You have belly (abdominal) pain that: ? Gets worse. ? Stays in one area (localizes).  You have a rash.  You have a stiff neck.  You get angry or annoyed more easily than normal (irritability).  You are more sleepy than normal.  You have a harder time waking up than normal.  You feel: ? Weak. ? Dizzy. ? Very thirsty.  You have peed (urinated) only a small amount of very dark pee during 6-8 hours. Get help right away if:  You have symptoms of   very bad dehydration.  You cannot drink fluids without throwing up (vomiting).  Your symptoms get worse with treatment.  You have a fever.  You have a very bad headache.  You are throwing up or having watery poop (diarrhea) and it: ? Gets worse. ? Does not go away.  You have blood or something green (bile) in your throw-up.  You have blood in your poop (stool). This may cause poop to look black and tarry.  You have not peed in 6-8 hours.  You pass out (faint).  Your heart rate when you are sitting still is more than 100 beats a  minute.  You have trouble breathing. This information is not intended to replace advice given to you by your health care provider. Make sure you discuss any questions you have with your health care provider. Document Released: 09/13/2009 Document Revised: 06/06/2016 Document Reviewed: 01/11/2016 Elsevier Interactive Patient Education  2018 [employer]  Dehydration, Adult Dehydration is when there is not enough fluid or water in your body. This happens when you lose more fluids than you take in. Dehydration can range from mild to very bad. It should be treated right away to keep it from getting very bad. Symptoms of mild dehydration may include:  Thirst.  Dry lips.  Slightly dry mouth.  Dry, warm skin.  Dizziness. Symptoms of moderate dehydration may include:  Very dry mouth.  Muscle cramps.  Dark pee (urine). Pee may be the color of tea.  Your body making less pee.  Your eyes making fewer tears.  Heartbeat that is uneven or faster than normal (palpitations).  Headache.  Light-headedness, especially when you stand up from sitting.  Fainting (syncope). Symptoms of very bad dehydration may include:  Changes in skin, such as: ? Cold and clammy skin. ? Blotchy (mottled) or pale skin. ? Skin that does not quickly return to normal after being lightly pinched and let go (poor skin turgor).  Changes in body fluids, such as: ? Feeling very thirsty. ? Your eyes making fewer tears. ? Not sweating when body temperature is high, such as in hot weather. ? Your body making very little pee.  Changes in vital signs, such as: ? Weak pulse. ? Pulse that is more than 100 beats a minute when you are sitting still. ? Fast breathing. ? Low blood pressure.  Other changes, such as: ? Sunken eyes. ? Cold hands and feet. ? Confusion. ? Lack of energy (lethargy). ? Trouble waking up from sleep. ? Short-term weight loss. ? Unconsciousness. Follow these instructions at  home:  If told by your doctor, drink an ORS: ? Make an ORS by using instructions on the package. ? Start by drinking small amounts, about  cup (120 mL) every 5-10 minutes. ? Slowly drink more until you have had the amount that your doctor said to have.  Drink enough clear fluid to keep your pee clear or pale yellow. If you were told to drink an ORS, finish the ORS first, then start slowly drinking clear fluids. Drink fluids such as: ? Water. Do not drink only water by itself. Doing that can make the salt (sodium) level in your body get too low (hyponatremia). ? Ice chips. ? Fruit juice that you have added water to (diluted). ? Low-calorie sports drinks.  Avoid: ? Alcohol. ? Drinks that have a lot of sugar. These include high-calorie sports drinks, fruit juice that does not have water added, and soda. ? Caffeine. ? Foods that are greasy or have   a lot of fat or sugar.  Take over-the-counter and prescription medicines only as told by your doctor.  Do not take salt tablets. Doing that can make the salt level in your body get too high (hypernatremia).  Eat foods that have minerals (electrolytes). Examples include bananas, oranges, potatoes, tomatoes, and spinach.  Keep all follow-up visits as told by your doctor. This is important. Contact a doctor if:  You have belly (abdominal) pain that: ? Gets worse. ? Stays in one area (localizes).  You have a rash.  You have a stiff neck.  You get angry or annoyed more easily than normal (irritability).  You are more sleepy than normal.  You have a harder time waking up than normal.  You feel: ? Weak. ? Dizzy. ? Very thirsty.  You have peed (urinated) only a small amount of very dark pee during 6-8 hours. Get help right away if:  You have symptoms of very bad dehydration.  You cannot drink fluids without throwing up (vomiting).  Your symptoms get worse with treatment.  You have a fever.  You have a very bad headache.  You  are throwing up or having watery poop (diarrhea) and it: ? Gets worse. ? Does not go away.  You have blood or something green (bile) in your throw-up.  You have blood in your poop (stool). This may cause poop to look black and tarry.  You have not peed in 6-8 hours.  You pass out (faint).  Your heart rate when you are sitting still is more than 100 beats a minute.  You have trouble breathing. This information is not intended to replace advice given to you by your health care provider. Make sure you discuss any questions you have with your health care provider. Document Released: 09/13/2009 Document Revised: 06/06/2016 Document Reviewed: 01/11/2016 Elsevier Interactive Patient Education  2018 [employer] Inc[employer]  

## 2018-03-04 NOTE — Progress Notes (Signed)
Patient Care Team: [name], MD as PCP - General (Internal Medicine)  DIAGNOSIS:  Encounter Diagnosis  Name Primary?  . Malignant neoplasm of upper-inner quadrant of left breast in female, estrogen receptor positive ([place])     SUMMARY OF ONCOLOGIC HISTORY:   Malignant neoplasm of upper-inner quadrant of left breast in female, estrogen receptor positive ([place])   02/03/2018 Initial Diagnosis    Left breast palpable lump at 1130 position 2.3 cm, at 11 o'clock position 6 mm which was benign; biopsy revealed IDC grade 3 ER 30%, PR 0%, Ki-67 30%, HER-2 positive ratio 5.62, copy #16.3, axillary lymph node biopsy benign concordant, T2 N0 stage II a clinical stage AJCC 8      02/18/2018 Breast MRI    Solitary mass in left breast upper outer quadrant 2.5 cm.  No additional enhancement in the right breast.  No adenopathy      02/26/2018 -  Neo-Adjuvant Chemotherapy    TCH Perjeta followed by Herceptin Perjeta maintenance       CHIEF COMPLIANT: Cycle 1 day 8 TCH Perjeta  INTERVAL HISTORY: [name] is a 72 year old with above-mentioned history of left breast cancer currently neoadjuvant chemotherapy and received first cycle of Kiana Perjeta last week and is here for toxicity check.  Patient tells me that she has been feeling quite miserable since her treatment.  She has had profound diarrhea that does not seem to get better with Imodium.  She is also had a rash on the chest wall between her breasts.  She complains of feeling weak and nauseated.  She has not been eating or drinking much because of the nausea as well as the diarrhea.  Complaining of pain in the left breast  REVIEW OF SYSTEMS:   Constitutional: Denies fevers, chills or abnormal weight loss Eyes: Denies blurriness of vision Ears, nose, mouth, throat, and face: Denies mucositis or sore throat Respiratory: Denies cough, dyspnea or wheezes Cardiovascular: Denies palpitation, chest discomfort Gastrointestinal: Nausea and  diarrhea Skin: Denies abnormal skin rashes Lymphatics: Denies new lymphadenopathy or easy bruising Neurological:Denies numbness, tingling or new weaknesses Behavioral/Psych: Mood is stable, no new changes  Extremities: No lower extremity edema  All other systems were reviewed with the patient and are negative.  I have reviewed the past medical history, past surgical history, social history and family history with the patient and they are unchanged from previous note.  ALLERGIES:  is allergic to codeine and demerol [meperidine].  MEDICATIONS:  Current Outpatient Medications  Medication Sig Dispense Refill  . amLODipine (NORVASC) 10 MG tablet Take 10 mg by mouth daily.     Marland Kitchen buPROPion (WELLBUTRIN SR) 150 MG 12 hr tablet Start one week before quit date. Take 1 tab daily x 3 days, then 1 tab BID thereafter. (Patient taking differently: Take 150 mg by mouth 2 (two) times daily. Start one week before quit date. Take 1 tab daily x 3 days, then 1 tab BID thereafter.) 60 tablet 2  . dexamethasone (DECADRON) 4 MG tablet Take 1 tablet (4 mg total) by mouth daily. Take 1 tablet day before chemo and take 1 tablet day after chemo 12 tablet 0  . hydrochlorothiazide (HYDRODIURIL) 12.5 MG tablet Take 12.5 mg by mouth daily.     . hydrOXYzine (ATARAX/VISTARIL) 50 MG tablet Take 50 mg by mouth 3 (three) times daily as needed for anxiety.     Marland Kitchen ibuprofen (ADVIL,MOTRIN) 800 MG tablet Take 1 tablet (800 mg total) by mouth every 8 (eight) hours as  needed. 30 tablet 0  . latanoprost (XALATAN) 0.005 % ophthalmic solution Place 1 drop into both eyes at bedtime.     . lidocaine-prilocaine (EMLA) cream Apply to affected area once 30 g 3  . LORazepam (ATIVAN) 0.5 MG tablet Take 1 tablet (0.5 mg total) by mouth at bedtime as needed for sleep. 30 tablet 0  . nicotine (NICODERM CQ - DOSED IN MG/24 HOURS) 14 mg/24hr patch Place 1 patch (14 mg total) onto the skin daily. Apply 21 mg patch daily x 6 wk, then 32m patch daily  x 2 wk, then 7 mg patch daily x 2 wk 14 patch 0  . nicotine (NICODERM CQ - DOSED IN MG/24 HOURS) 21 mg/24hr patch Place 1 patch (21 mg total) onto the skin daily. Apply 21 mg patch daily x 6 wk, then 139mpatch daily x 2 wk, then 7 mg patch daily x 2 wk 14 patch 2  . nicotine (NICODERM CQ - DOSED IN MG/24 HR) 7 mg/24hr patch Place 1 patch (7 mg total) onto the skin daily. Apply 21 mg patch daily x 6 wk, then 1421match daily x 2 wk, then 7 mg patch daily x 2 wk 14 patch 0  . omeprazole (PRILOSEC) 20 MG capsule Take 20 mg by mouth 2 (two) times daily before a meal.     . ondansetron (ZOFRAN) 8 MG tablet Take 1 tablet (8 mg total) by mouth 2 (two) times daily as needed for refractory nausea / vomiting. 30 tablet 1  . oxyCODONE (OXY IR/ROXICODONE) 5 MG immediate release tablet Take 1 tablet (5 mg total) by mouth every 6 (six) hours as needed for severe pain. 10 tablet 0  . pramipexole (MIRAPEX) 0.25 MG tablet Take 0.25-1 mg by mouth at bedtime.     . prochlorperazine (COMPAZINE) 10 MG tablet Take 1 tablet (10 mg total) by mouth every 6 (six) hours as needed (Nausea or vomiting). 30 tablet 1   No current facility-administered medications for this visit.     PHYSICAL EXAMINATION: ECOG PERFORMANCE STATUS: 1 - Symptomatic but completely ambulatory  Vitals:   03/04/18 0957  BP: 113/71  Pulse: 99  Resp: 18  Temp: 98.4 F (36.9 C)  SpO2: 100%   Filed Weights   03/04/18 0957  Weight: 143 lb 1.6 oz (64.9 kg)    GENERAL:alert, no distress and comfortable SKIN: skin color, texture, turgor are normal, no rashes or significant lesions EYES: normal, Conjunctiva are pink and non-injected, sclera clear OROPHARYNX:no exudate, no erythema and lips, buccal mucosa, and tongue normal  NECK: supple, thyroid normal size, non-tender, without nodularity LYMPH:  no palpable lymphadenopathy in the cervical, axillary or inguinal LUNGS: clear to auscultation and percussion with normal breathing effort HEART:  regular rate & rhythm and no murmurs and no lower extremity edema ABDOMEN:abdomen soft, non-tender and normal bowel sounds MUSCULOSKELETAL:no cyanosis of digits and no clubbing  NEURO: alert & oriented x 3 with fluent speech, no focal motor/sensory deficits EXTREMITIES: No lower extremity edema  LABORATORY DATA:  I have reviewed the data as listed CMP Latest Ref Rng & Units 02/26/2018 02/22/2018 02/10/2018  Glucose 70 - 140 mg/dL 87 93 98  BUN 7 - 26 mg/dL 19 13 17   Creatinine 0.60 - 1.10 mg/dL 0.87 0.95 0.95  Sodium 136 - 145 mmol/L 140 137 138  Potassium 3.5 - 5.1 mmol/L 4.2 4.3 4.1  Chloride 98 - 109 mmol/L 106 103 104  CO2 22 - 29 mmol/L 25 22 25   Calcium 8.4 -  10.4 mg/dL 9.5 9.2 9.9  Total Protein 6.4 - 8.3 g/dL 7.3 7.3 8.0  Total Bilirubin 0.2 - 1.2 mg/dL 0.3 0.5 0.4  Alkaline Phos 40 - 150 U/L 81 81 88  AST 5 - 34 U/L 16 23 17   ALT 0 - 55 U/L 12 13(L) 11    Lab Results  Component Value Date   WBC 10.6 (H) 03/04/2018   HGB 12.2 02/22/2018   HCT 37.4 03/04/2018   MCV 86.0 03/04/2018   PLT 296 03/04/2018   NEUTROABS 7.3 (H) 03/04/2018    ASSESSMENT & PLAN:  Malignant neoplasm of upper-inner quadrant of left breast in female, estrogen receptor positive ([facility]) 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, copy #16.3, axillary lymph node biopsy benign concordant, T2 N0 stage II a clinical stage AJCC 8  Treatment plan: 1.Neoadjuvant chemotherapy with TCH Perjeta x6 cycles followed by Herceptin Perjeta maintenance for 1 year 2.followed by breast conserving surgery 74fllowed by adjuvant radiation 4.Followed by adjuvant antiestrogen therapy with letrozole for 5-7 years ------------------------------------------------------------------------------------------------------------------------ Current treatment: Cycle 1 day 8 TCHP Chemo  Toxicities: 1.  Diarrhea: Multiple times throughout the day related to Perjeta.  I give a prescription for Lomotil in addition to Imodium. 2. Nausea: Instructed her to take nausea medication around the clock 3.  Blood work was reviewed.  Patient does not have any evidence of neutropenia. Labs have been reviewed. Monitoring closely for chemo toxicities Return to clinic in 2 weeks for cycle 2   No orders of the defined types were placed in this encounter.  The patient has a good understanding of the overall plan. she agrees with it. she will call with any problems that may develop before the next visit here.   VHarriette Ohara MD 03/04/18

## 2018-03-04 NOTE — Progress Notes (Signed)
Pt is approved for the $1000 [employer] grant.  

## 2018-03-12 ENCOUNTER — Other Ambulatory Visit: Payer: Self-pay | Admitting: Adult Health

## 2018-03-12 DIAGNOSIS — C50212 Malignant neoplasm of upper-inner quadrant of left female breast: Secondary | ICD-10-CM

## 2018-03-12 DIAGNOSIS — Z170 Estrogen receptor positive status [ER+]: Principal | ICD-10-CM

## 2018-03-12 MED ORDER — CHOLESTYRAMINE 4 G PO PACK: 4.0000 g | PACK | Freq: Three times a day (TID) | ORAL | 12 refills | Status: DC

## 2018-03-12 NOTE — Progress Notes (Signed)
Sent in [place] for her uncontrolled diarrhea per [place].  Per dawn she is hydrating enough and has no side effects of diarrhea.  If not improved by Monday, she should come in for further evaluation.  Wilber Bihari, NP

## 2018-03-16 ENCOUNTER — Encounter: Payer: Self-pay | Admitting: *Deleted

## 2018-03-16 ENCOUNTER — Inpatient Hospital Stay (HOSPITAL_BASED_OUTPATIENT_CLINIC_OR_DEPARTMENT_OTHER): Payer: Medicare HMO | Admitting: Adult Health

## 2018-03-16 ENCOUNTER — Other Ambulatory Visit: Payer: Self-pay | Admitting: *Deleted

## 2018-03-16 ENCOUNTER — Inpatient Hospital Stay: Payer: Medicare HMO

## 2018-03-16 ENCOUNTER — Encounter: Payer: Self-pay | Admitting: Adult Health

## 2018-03-16 VITALS — BP 119/68 | HR 82 | Temp 98.4°F | Resp 18 | Ht 63.5 in | Wt 147.4 lb

## 2018-03-16 DIAGNOSIS — Z801 Family history of malignant neoplasm of trachea, bronchus and lung: Secondary | ICD-10-CM

## 2018-03-16 DIAGNOSIS — F419 Anxiety disorder, unspecified: Secondary | ICD-10-CM

## 2018-03-16 DIAGNOSIS — Z95828 Presence of other vascular implants and grafts: Secondary | ICD-10-CM

## 2018-03-16 DIAGNOSIS — Z170 Estrogen receptor positive status [ER+]: Principal | ICD-10-CM

## 2018-03-16 DIAGNOSIS — Z800 Family history of malignant neoplasm of digestive organs: Secondary | ICD-10-CM

## 2018-03-16 DIAGNOSIS — M542 Cervicalgia: Secondary | ICD-10-CM | POA: Diagnosis not present

## 2018-03-16 DIAGNOSIS — R197 Diarrhea, unspecified: Secondary | ICD-10-CM

## 2018-03-16 DIAGNOSIS — R509 Fever, unspecified: Secondary | ICD-10-CM

## 2018-03-16 DIAGNOSIS — R531 Weakness: Secondary | ICD-10-CM | POA: Diagnosis not present

## 2018-03-16 DIAGNOSIS — C50212 Malignant neoplasm of upper-inner quadrant of left female breast: Secondary | ICD-10-CM | POA: Diagnosis not present

## 2018-03-16 DIAGNOSIS — Z803 Family history of malignant neoplasm of breast: Secondary | ICD-10-CM

## 2018-03-16 DIAGNOSIS — R110 Nausea: Secondary | ICD-10-CM

## 2018-03-16 DIAGNOSIS — Z79899 Other long term (current) drug therapy: Secondary | ICD-10-CM

## 2018-03-16 DIAGNOSIS — G2581 Restless legs syndrome: Secondary | ICD-10-CM

## 2018-03-16 DIAGNOSIS — R21 Rash and other nonspecific skin eruption: Secondary | ICD-10-CM

## 2018-03-16 DIAGNOSIS — F17210 Nicotine dependence, cigarettes, uncomplicated: Secondary | ICD-10-CM

## 2018-03-16 DIAGNOSIS — K219 Gastro-esophageal reflux disease without esophagitis: Secondary | ICD-10-CM

## 2018-03-16 DIAGNOSIS — R52 Pain, unspecified: Secondary | ICD-10-CM

## 2018-03-16 LAB — INFLUENZA PANEL BY PCR (TYPE A & B)
INFLBPCR: NEGATIVE
Influenza A By PCR: NEGATIVE

## 2018-03-16 LAB — CBC WITH DIFFERENTIAL (CANCER CENTER ONLY)
BASOS PCT: 0 %
Basophils Absolute: 0.1 10*3/uL (ref 0.0–0.1)
Eosinophils Absolute: 0 10*3/uL (ref 0.0–0.5)
Eosinophils Relative: 0 %
HEMATOCRIT: 31 % — AB (ref 34.8–46.6)
Hemoglobin: 10.2 g/dL — ABNORMAL LOW (ref 11.6–15.9)
LYMPHS PCT: 7 %
Lymphs Abs: 2.3 10*3/uL (ref 0.9–3.3)
MCH: 27.9 pg (ref 25.1–34.0)
MCHC: 32.8 g/dL (ref 31.5–36.0)
MCV: 84.9 fL (ref 79.5–101.0)
MONOS PCT: 7 %
Monocytes Absolute: 2.4 10*3/uL — ABNORMAL HIGH (ref 0.1–0.9)
NEUTROS ABS: 29.6 10*3/uL — AB (ref 1.5–6.5)
Neutrophils Relative %: 86 %
Platelet Count: 359 10*3/uL (ref 145–400)
RBC: 3.65 MIL/uL — ABNORMAL LOW (ref 3.70–5.45)
RDW: 13.6 % (ref 11.2–14.5)
WBC Count: 34.4 10*3/uL — ABNORMAL HIGH (ref 3.9–10.3)

## 2018-03-16 LAB — COMPREHENSIVE METABOLIC PANEL
ALT: 18 U/L (ref 14–54)
ANION GAP: 12 (ref 5–15)
AST: 19 U/L (ref 15–41)
Albumin: 3.5 g/dL (ref 3.5–5.0)
Alkaline Phosphatase: 74 U/L (ref 38–126)
BUN: 16 mg/dL (ref 6–20)
CALCIUM: 8.5 mg/dL — AB (ref 8.9–10.3)
CHLORIDE: 106 mmol/L (ref 101–111)
CO2: 21 mmol/L — AB (ref 22–32)
CREATININE: 0.98 mg/dL (ref 0.44–1.00)
GFR calc Af Amer: >60 mL/min (ref >60)
GFR, EST NON AFRICAN AMERICAN: 58 mL/min — AB (ref >60)
Glucose, Bld: 105 mg/dL — ABNORMAL HIGH (ref 65–99)
Potassium: 3.6 mmol/L (ref 3.5–5.1)
SODIUM: 139 mmol/L (ref 135–145)
Total Bilirubin: 0.6 mg/dL (ref 0.3–1.2)
Total Protein: 6.9 g/dL (ref 6.5–8.1)

## 2018-03-16 MED ORDER — SODIUM CHLORIDE 0.9% FLUSH
10.0000 mL | Freq: Once | INTRAVENOUS | Status: AC
  Administered 2018-03-16: 10 mL
  Filled 2018-03-16: qty 10

## 2018-03-16 MED ORDER — HEPARIN SOD (PORK) LOCK FLUSH 100 UNIT/ML IV SOLN
500.0000 [IU] | Freq: Once | INTRAVENOUS | Status: AC
  Administered 2018-03-16: 500 [IU]
  Filled 2018-03-16: qty 5

## 2018-03-16 MED ORDER — SODIUM CHLORIDE 0.9 % IV SOLN: 1000.0000 mL | Freq: Once | INTRAVENOUS | Status: DC

## 2018-03-16 MED ORDER — AMOXICILLIN-POT CLAVULANATE 875-125 MG PO TABS: 1.0000 | ORAL_TABLET | Freq: Two times a day (BID) | ORAL | 0 refills | Status: DC

## 2018-03-16 NOTE — Progress Notes (Signed)
Patient called with complaints of fever 104.1 and vomiting since this morning.  She has taken Tylenol and is now down to 101.  Vomiting stopped at 730 this morning and she has taken nausea medication for this.  Patient will come in to see Mendel Ryder and labs today.  Will add flue panel to current lab orders.

## 2018-03-16 NOTE — Patient Instructions (Signed)

## 2018-03-16 NOTE — Assessment & Plan Note (Addendum)
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, copy #16.3, axillary lymph node biopsy benign concordant, T2 N0 stage II a clinical stage AJCC 8  Treatment plan: 1.Neoadjuvant chemotherapy with TCH Perjeta x6 cycles followed by Herceptin Perjeta maintenance for 1 year 2.followed by breast conserving surgery 53fllowed by adjuvant radiation 4.Followed by adjuvant antiestrogen therapy with letrozole for 5-7 years ------------------------------------------------------------------------------------------------------------------------ Current treatment: Cycle 1 day 19 TCHP   [name] having a difficult time with her fevers, nausea, vomiting, diarrhea.  She was seen by myself and Dr. GLindi Adietoday.  She will undergo a full fever work up with blood cultures x 2, urinalysis/urine culture, chest xray, and flu panel.   Her WBC was 34 today.  She will also receive one liter of IV fluids.  I prescribed Augmentin and refilled her Lorazepam.  We will delay her chemotherapy by one week.  Dr. GLindi Adiediscussed with patient removing the Carboplatin from her regimen and giving her Taxotere, Herceptin, Perjeta.

## 2018-03-16 NOTE — Progress Notes (Addendum)
[facility] Follow up:    Michelle Ebbs, MD [place] [address]   DIAGNOSIS: Cancer Staging Malignant neoplasm of upper-inner quadrant of left breast in female, estrogen receptor positive ([facility]) Staging form: Breast, AJCC 8th Edition - Clinical stage from 02/10/2018: Stage IIA (cT2, cN0, cM0, G3, ER: Positive, PR: Negative, HER2: Positive) - Unsigned Staging comments: Staged at breast conference on 3.13.19   SUMMARY OF ONCOLOGIC HISTORY:   Malignant neoplasm of upper-inner quadrant of left breast in female, estrogen receptor positive ([facility])   02/03/2018 Initial Diagnosis    Left breast palpable lump at 1130 position 2.3 cm, at 11 o'clock position 6 mm which was benign; biopsy revealed IDC grade 3 ER 30%, PR 0%, Ki-67 30%, HER-2 positive ratio 5.62, copy #16.3, axillary lymph node biopsy benign concordant, T2 N0 stage II a clinical stage AJCC 8      02/18/2018 Breast MRI    Solitary mass in left breast upper outer quadrant 2.5 cm.  No additional enhancement in the right breast.  No adenopathy      02/26/2018 -  Neo-Adjuvant Chemotherapy    TCH Perjeta followed by Herceptin Perjeta maintenance       CURRENT THERAPY: TCHP cycle 1 day 19  INTERVAL HISTORY: [name] 72 y.o. female returns for evaluation of fever, headaches, nausea.  The fever started this morning, as high as 104.1 earlier.  [name] took Tylenol and it came down some.  [name] has kept a headache for the past four days.  This is a generalized headache.  [name] is c/o blurred vision in her left eye.  [name] says her headache is gone when [name] is sleeping.  [name] c/o neck pain.  [name] has had some nausea/vomiting from 330 this morning until 730 today.     Patient Active Problem List   Diagnosis Date Noted  . Port-A-Cath in place 02/26/2018  . Genetic testing 02/19/2018  . Family history of breast cancer   . Malignant neoplasm of upper-inner quadrant of left breast in female, estrogen receptor  positive ([facility]) 02/09/2018    is allergic to codeine and demerol [meperidine].  MEDICAL HISTORY: Past Medical History:  Diagnosis Date  . Anxiety   . Arthritis   . Breast cancer ([facility])   . Cough   . Family history of breast cancer   . Genetic testing 02/19/2018   Multi-Cancer panel (83 genes) @ [facility] - No pathogenic mutations detected  . GERD (gastroesophageal reflux disease)   . Hypertension   . RLS (restless legs syndrome)     SURGICAL HISTORY: Past Surgical History:  Procedure Laterality Date  . ABDOMINAL HYSTERECTOMY     1970's due to ectopic pregnancy  . BREAST BIOPSY Left 2016  . BREAST SURGERY     cyst removal  . CARPOMETACARPEL SUSPENSION PLASTY Right 10/30/2016   Procedure: SUSPENSION PLASTY abductor pollicis longus transfer excision trapezium right;  Surgeon: Daryll Brod, MD;  Location: [facility];  Service: Orthopedics;  Laterality: Right;  axillary block  SUSPENSION PLASTY abductor pollicis longus transfer excision trapezium right  . KIDNEY DONATION Left   . PORTACATH PLACEMENT Right 02/24/2018   Procedure: INSERTION OF RIGHT INTERNAL JUGULAR PORT-A-CATH WITH ULTRA SOUND GUIDANCE ERAS PATHWAY;  Surgeon: Erroll Luna, MD;  Location: [facility];  Service: General;  Laterality: Right;  . WRIST SURGERY     cyst removal    SOCIAL HISTORY: Social History   Socioeconomic History  . Marital status: Married  Spouse name: Not on file  . Number of children: Not on file  . Years of education: Not on file  . Highest education level: Not on file  Occupational History  . Not on file  Social Needs  . Financial resource strain: Not on file  . Food insecurity:    Worry: Not on file    Inability: Not on file  . Transportation needs:    Medical: Not on file    Non-medical: Not on file  Tobacco Use  . Smoking status: Current Every Day Smoker    Packs/day: 1.00    Years: 47.00    Pack years: 47.00    Types: Cigarettes  . Smokeless tobacco: Never Used   Substance and Sexual Activity  . Alcohol use: No  . Drug use: No  . Sexual activity: Not on file  Lifestyle  . Physical activity:    Days per week: Not on file    Minutes per session: Not on file  . Stress: Not on file  Relationships  . Social connections:    Talks on phone: Not on file    Gets together: Not on file    Attends religious service: Not on file    Active member of club or organization: Not on file    Attends meetings of clubs or organizations: Not on file    Relationship status: Not on file  . Intimate partner violence:    Fear of current or ex partner: Not on file    Emotionally abused: Not on file    Physically abused: Not on file    Forced sexual activity: Not on file  Other Topics Concern  . Not on file  Social History Narrative  . Not on file    FAMILY HISTORY: Family History  Problem Relation Age of Onset  . Liver cancer Mother        deceased 8  . Lung cancer Father 7       deceased 97; smoker  . Stomach cancer Maternal Grandmother        deceased 66  . Cancer Paternal Uncle        deceased 48; unk. type  . Breast cancer Maternal Aunt        dx 42s; deceased 46s  . Breast cancer Maternal Aunt        dx 63s; deceased 62s  . Breast cancer Cousin        daughter of mat aunt with breast ca    Review of Systems  Constitutional: Positive for chills, fatigue and fever. Negative for appetite change and unexpected weight change.  HENT:   Negative for hearing loss, lump/mass and trouble swallowing.   Eyes: Negative for eye problems and icterus.  Respiratory: Positive for cough. Negative for chest tightness and shortness of breath.   Cardiovascular: Negative for chest pain, leg swelling and palpitations.  Gastrointestinal: Positive for diarrhea, nausea and vomiting. Negative for abdominal distention, abdominal pain, blood in stool and constipation.  Endocrine: Negative for hot flashes.  Musculoskeletal: Negative for arthralgias and gait problem.   Neurological: Positive for headaches. Negative for dizziness, extremity weakness, gait problem, light-headedness, numbness and speech difficulty.  Hematological: Negative for adenopathy. Does not bruise/bleed easily.  Psychiatric/Behavioral: Negative for depression. The patient is not nervous/anxious.       PHYSICAL EXAMINATION  ECOG PERFORMANCE STATUS: 3 - Symptomatic, >50% confined to bed  Vitals:   03/16/18 1320  BP: 105/60  Pulse: 91  Resp: 18  Temp: 99.2 F (  37.3 C)  SpO2: 98%    Physical Exam  Constitutional: [name] is oriented to person, place, and time and well-developed, well-nourished, and in no distress.  HENT:  Head: Normocephalic and atraumatic.  Eyes: Pupils are equal, round, and reactive to light. No scleral icterus.  Neck: Neck supple.  Cardiovascular: Normal rate, regular rhythm and normal heart sounds.  Pulmonary/Chest: Effort normal and breath sounds normal. No respiratory distress. [name] has no wheezes. [name] has no rales.  Abdominal: Soft. Bowel sounds are normal. [name] exhibits no distension and no mass. There is no tenderness. There is no rebound and no guarding.  Musculoskeletal: [name] exhibits no edema.  Lymphadenopathy:    [name] has no cervical adenopathy.  Neurological: [name] is alert and oriented to person, place, and time.  Patient flexed neck with me, and reported increase in headache, however when Dr. Lindi Adie examined her [name] said [name] didn't have any pain. [name] denies any neck stiffness, or inability to touch the chin to the chest.   Skin: Skin is warm and dry. No rash noted.  Psychiatric: Mood and affect normal.    LABORATORY DATA:  CBC    Component Value Date/Time   WBC 34.4 (H) 03/16/2018 1255   WBC 11.8 (H) 02/22/2018 1208   RBC 3.65 (L) 03/16/2018 1255   HGB 12.2 02/22/2018 1208   HCT 31.0 (L) 03/16/2018 1255   PLT 359 03/16/2018 1255   MCV 84.9 03/16/2018 1255   MCH 27.9 03/16/2018 1255   MCHC 32.8 03/16/2018 1255   RDW 13.6 03/16/2018 1255    LYMPHSABS 2.3 03/16/2018 1255   MONOABS 2.4 (H) 03/16/2018 1255   EOSABS 0.0 03/16/2018 1255   BASOSABS 0.1 03/16/2018 1255    CMP     Component Value Date/Time   NA 139 03/16/2018 1255   K 3.6 03/16/2018 1255   CL 106 03/16/2018 1255   CO2 21 (L) 03/16/2018 1255   GLUCOSE 105 (H) 03/16/2018 1255   BUN 16 03/16/2018 1255   CREATININE 0.98 03/16/2018 1255   CREATININE 1.00 03/04/2018 0936   CALCIUM 8.5 (L) 03/16/2018 1255   PROT 6.9 03/16/2018 1255   ALBUMIN 3.5 03/16/2018 1255   AST 19 03/16/2018 1255   AST 19 03/04/2018 0936   ALT 18 03/16/2018 1255   ALT 17 03/04/2018 0936   ALKPHOS 74 03/16/2018 1255   BILITOT 0.6 03/16/2018 1255   BILITOT 0.5 03/04/2018 0936   GFRNONAA 58 (L) 03/16/2018 1255   GFRNONAA 56 (L) 03/04/2018 0936   GFRAA >60 03/16/2018 1255   GFRAA >60 03/04/2018 0936       ASSESSMENT and THERAPY PLAN:   Malignant neoplasm of upper-inner quadrant of left breast in female, estrogen receptor positive ([facility]) 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, copy #16.3, axillary lymph node biopsy benign concordant, T2 N0 stage II a clinical stage AJCC 8  Treatment plan: 1.Neoadjuvant chemotherapy with TCH Perjeta x6 cycles followed by Herceptin Perjeta maintenance for 1 year 2.followed by breast conserving surgery 19fllowed by adjuvant radiation 4.Followed by adjuvant antiestrogen therapy with letrozole for 5-7 years ------------------------------------------------------------------------------------------------------------------------ Current treatment: Cycle 1 day 19 TCHP   BLileeis having a difficult time with her fevers, nausea, vomiting, diarrhea.  [name] was seen by myself and Dr. GLindi Adietoday.  [name] will undergo a full fever work up with blood cultures x 2, urinalysis/urine culture,  chest xray, and flu  panel.   Her WBC was 34 today.  [name] will also receive one liter of IV fluids.  I prescribed Augmentin and refilled her Lorazepam.  We will delay her chemotherapy by one week.  Dr. Lindi Adie discussed with patient removing the Carboplatin from her regimen and giving her Taxotere, Herceptin, Perjeta.          Orders Placed This Encounter  Procedures  . Culture, Urine    Standing Status:   Future    Standing Expiration Date:   03/17/2019  . Culture, Blood    Standing Status:   Future    Number of Occurrences:   1    Standing Expiration Date:   03/17/2019  . Culture, Blood    Standing Status:   Future    Number of Occurrences:   1    Standing Expiration Date:   03/16/2019  . DG Chest 2 View    Standing Status:   Future    Standing Expiration Date:   03/16/2019    Order Specific Question:   Reason for Exam (SYMPTOM  OR DIAGNOSIS REQUIRED)    Answer:   fever, cough    Order Specific Question:   Preferred imaging location?    Answer:   [facility]    Order Specific Question:   Radiology Contrast Protocol - do NOT remove file path    Answer:   [email or web address]  . Comprehensive metabolic panel  . Urinalysis, Complete w Microscopic    Standing Status:   Future    Standing Expiration Date:   03/17/2019    All questions were answered. The patient knows to call the clinic with any problems, questions or concerns. We can certainly see the patient much sooner if necessary.  A total of (30) minutes of face-to-face time was spent with this patient with greater than 50% of that time in counseling and care-coordination.  This note was electronically signed. Scot Dock, NP 03/16/2018   Addendum: Patient is having extraordinary difficulty with current chemotherapy regimen. We will administer IV fluids today. I will discontinue carboplatin and decrease the dosage of Taxotere. Hopefully [name] will be able to tolerate the next cycle  better. Patient was very close to feeling that [name] cannot continue any further treatment. Monitoring closely for toxicities.

## 2018-03-18 ENCOUNTER — Other Ambulatory Visit: Payer: Medicare HMO

## 2018-03-18 ENCOUNTER — Ambulatory Visit: Payer: Medicare HMO

## 2018-03-21 LAB — CULTURE, BLOOD (SINGLE)
CULTURE: NO GROWTH
Culture: NO GROWTH
Special Requests: ADEQUATE
Special Requests: ADEQUATE

## 2018-03-22 ENCOUNTER — Telehealth: Payer: Self-pay

## 2018-03-22 NOTE — Telephone Encounter (Signed)
Spoke with patient to see if any symptoms have recurred after fluids last week.  Patient states she is feeling better and that diarrhea has stopped.  She denies any other symptoms at this time.  She has an appt with NP, labs and infusion on 4/25.  Patient knows to call center with any concerns.

## 2018-03-25 ENCOUNTER — Encounter: Payer: Self-pay | Admitting: Adult Health

## 2018-03-25 ENCOUNTER — Inpatient Hospital Stay: Payer: Medicare HMO

## 2018-03-25 ENCOUNTER — Telehealth: Payer: Self-pay | Admitting: Adult Health

## 2018-03-25 ENCOUNTER — Inpatient Hospital Stay (HOSPITAL_BASED_OUTPATIENT_CLINIC_OR_DEPARTMENT_OTHER): Payer: Medicare HMO | Admitting: Adult Health

## 2018-03-25 ENCOUNTER — Other Ambulatory Visit: Payer: Self-pay | Admitting: Oncology

## 2018-03-25 ENCOUNTER — Encounter: Payer: Self-pay | Admitting: *Deleted

## 2018-03-25 VITALS — BP 131/74 | HR 83 | Temp 97.8°F | Resp 18 | Ht 63.5 in | Wt 150.6 lb

## 2018-03-25 DIAGNOSIS — K219 Gastro-esophageal reflux disease without esophagitis: Secondary | ICD-10-CM | POA: Diagnosis not present

## 2018-03-25 DIAGNOSIS — Z5111 Encounter for antineoplastic chemotherapy: Secondary | ICD-10-CM | POA: Diagnosis not present

## 2018-03-25 DIAGNOSIS — F17210 Nicotine dependence, cigarettes, uncomplicated: Secondary | ICD-10-CM

## 2018-03-25 DIAGNOSIS — G2581 Restless legs syndrome: Secondary | ICD-10-CM | POA: Diagnosis not present

## 2018-03-25 DIAGNOSIS — Z170 Estrogen receptor positive status [ER+]: Secondary | ICD-10-CM

## 2018-03-25 DIAGNOSIS — C50212 Malignant neoplasm of upper-inner quadrant of left female breast: Secondary | ICD-10-CM

## 2018-03-25 DIAGNOSIS — Z800 Family history of malignant neoplasm of digestive organs: Secondary | ICD-10-CM

## 2018-03-25 DIAGNOSIS — M542 Cervicalgia: Secondary | ICD-10-CM

## 2018-03-25 DIAGNOSIS — Z5112 Encounter for antineoplastic immunotherapy: Secondary | ICD-10-CM

## 2018-03-25 DIAGNOSIS — Z79899 Other long term (current) drug therapy: Secondary | ICD-10-CM | POA: Diagnosis not present

## 2018-03-25 DIAGNOSIS — F419 Anxiety disorder, unspecified: Secondary | ICD-10-CM

## 2018-03-25 DIAGNOSIS — Z95828 Presence of other vascular implants and grafts: Secondary | ICD-10-CM

## 2018-03-25 LAB — CMP (CANCER CENTER ONLY)
ALBUMIN: 3.5 g/dL (ref 3.5–5.0)
ALT: 11 U/L (ref 0–55)
AST: 14 U/L (ref 5–34)
Alkaline Phosphatase: 82 U/L (ref 40–150)
Anion gap: 11 (ref 3–11)
BUN: 13 mg/dL (ref 7–26)
CHLORIDE: 108 mmol/L (ref 98–109)
CO2: 21 mmol/L — ABNORMAL LOW (ref 22–29)
CREATININE: 0.84 mg/dL (ref 0.60–1.10)
Calcium: 9.3 mg/dL (ref 8.4–10.4)
GFR, Est AFR Am: >60 mL/min (ref >60)
GLUCOSE: 104 mg/dL (ref 70–140)
Potassium: 3.7 mmol/L (ref 3.5–5.1)
Sodium: 140 mmol/L (ref 136–145)
Total Bilirubin: <0.2 mg/dL — ABNORMAL LOW (ref 0.2–1.2)
Total Protein: 7.1 g/dL (ref 6.4–8.3)

## 2018-03-25 LAB — CBC WITH DIFFERENTIAL (CANCER CENTER ONLY)
Basophils Absolute: 0 10*3/uL (ref 0.0–0.1)
Basophils Relative: 0 %
EOS ABS: 0 10*3/uL (ref 0.0–0.5)
EOS PCT: 0 %
HCT: 28.8 % — ABNORMAL LOW (ref 34.8–46.6)
Hemoglobin: 9.6 g/dL — ABNORMAL LOW (ref 11.6–15.9)
LYMPHS ABS: 1.9 10*3/uL (ref 0.9–3.3)
Lymphocytes Relative: 16 %
MCH: 28.6 pg (ref 25.1–34.0)
MCHC: 33.3 g/dL (ref 31.5–36.0)
MCV: 85.7 fL (ref 79.5–101.0)
MONO ABS: 1.5 10*3/uL — AB (ref 0.1–0.9)
MONOS PCT: 12 %
Neutro Abs: 8.9 10*3/uL — ABNORMAL HIGH (ref 1.5–6.5)
Neutrophils Relative %: 72 %
PLATELETS: 405 10*3/uL — AB (ref 145–400)
RBC: 3.36 MIL/uL — ABNORMAL LOW (ref 3.70–5.45)
RDW: 14.9 % — AB (ref 11.2–14.5)
WBC: 12.3 10*3/uL — AB (ref 3.9–10.3)

## 2018-03-25 MED ORDER — PALONOSETRON HCL INJECTION 0.25 MG/5ML
0.2500 mg | Freq: Once | INTRAVENOUS | Status: AC
  Administered 2018-03-25: 0.25 mg via INTRAVENOUS

## 2018-03-25 MED ORDER — SODIUM CHLORIDE 0.9 % IV SOLN
Freq: Once | INTRAVENOUS | Status: AC
  Administered 2018-03-25: 10:00:00 via INTRAVENOUS
  Filled 2018-03-25: qty 5

## 2018-03-25 MED ORDER — SODIUM CHLORIDE 0.9% FLUSH
10.0000 mL | Freq: Once | INTRAVENOUS | Status: AC
  Administered 2018-03-25: 10 mL
  Filled 2018-03-25: qty 10

## 2018-03-25 MED ORDER — SODIUM CHLORIDE 0.9 % IV SOLN
65.0000 mg/m2 | Freq: Once | INTRAVENOUS | Status: AC
  Administered 2018-03-25: 110 mg via INTRAVENOUS
  Filled 2018-03-25: qty 11

## 2018-03-25 MED ORDER — HEPARIN SOD (PORK) LOCK FLUSH 100 UNIT/ML IV SOLN
500.0000 [IU] | Freq: Once | INTRAVENOUS | Status: AC | PRN
  Administered 2018-03-25: 500 [IU]
  Filled 2018-03-25: qty 5

## 2018-03-25 MED ORDER — SODIUM CHLORIDE 0.9% FLUSH
10.0000 mL | INTRAVENOUS | Status: DC | PRN
  Administered 2018-03-25: 10 mL
  Filled 2018-03-25: qty 10

## 2018-03-25 MED ORDER — SODIUM CHLORIDE 0.9 % IV SOLN
Freq: Once | INTRAVENOUS | Status: AC
  Administered 2018-03-25: 09:00:00 via INTRAVENOUS

## 2018-03-25 MED ORDER — SODIUM CHLORIDE 0.9 % IV SOLN
420.0000 mg | Freq: Once | INTRAVENOUS | Status: AC
  Administered 2018-03-25: 420 mg via INTRAVENOUS
  Filled 2018-03-25: qty 14

## 2018-03-25 MED ORDER — SODIUM CHLORIDE 0.9 % IV SOLN
6.0000 mg/kg | Freq: Once | INTRAVENOUS | Status: AC
  Administered 2018-03-25: 399 mg via INTRAVENOUS
  Filled 2018-03-25: qty 19

## 2018-03-25 MED ORDER — DIPHENHYDRAMINE HCL 25 MG PO CAPS
50.0000 mg | ORAL_CAPSULE | Freq: Once | ORAL | Status: AC
  Administered 2018-03-25: 50 mg via ORAL

## 2018-03-25 MED ORDER — ACETAMINOPHEN 325 MG PO TABS
650.0000 mg | ORAL_TABLET | Freq: Once | ORAL | Status: AC
  Administered 2018-03-25: 650 mg via ORAL

## 2018-03-25 NOTE — Assessment & Plan Note (Addendum)
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, copy #16.3, axillary lymph node biopsy benign concordant, T2 N0 stage II a clinical stage AJCC 8  Treatment plan: 1.Neoadjuvant chemotherapy with TCH Perjeta x6 cycles followed by Herceptin Perjeta maintenance for 1 year (Carbo omitted cycle 2 and forward due to inability to tolerate) 2.followed by breast conserving surgery 47fllowed by adjuvant radiation 4.Followed by adjuvant antiestrogen therapy with letrozole for 5-7 years ------------------------------------------------------------------------------------------------------------------------ Current treatment: Cycle 2 day 1 TCHP  BNais feeling much improved than she did the last time that we have seen her.  She will proceed with her treatment today.  Her CBC was stable and I reviewed it with her in detail.  I increased her Lorazepam to 128mpo QHS PRN and hopefully that will help with her anxiety and inability to sleep.  She may need to consider effexor moving forward.  We did review the fact that she is planning on quitting smoking.  Her quit date is in may.  We reviewed how to take her smoking cessation medications once she is ready.

## 2018-03-25 NOTE — Patient Instructions (Signed)
[facility] Discharge Instructions for Patients Receiving Chemotherapy  Today you received the following chemotherapy agents Herceptin, Perjeta and Taxotere.  To help prevent nausea and vomiting after your treatment, we encourage you to take your nausea medication as directed   If you develop nausea and vomiting that is not controlled by your nausea medication, call the clinic.   BELOW ARE SYMPTOMS THAT SHOULD BE REPORTED IMMEDIATELY:  *FEVER GREATER THAN 100.5 F  *CHILLS WITH OR WITHOUT FEVER  NAUSEA AND VOMITING THAT IS NOT CONTROLLED WITH YOUR NAUSEA MEDICATION  *UNUSUAL SHORTNESS OF BREATH  *UNUSUAL BRUISING OR BLEEDING  TENDERNESS IN MOUTH AND THROAT WITH OR WITHOUT PRESENCE OF ULCERS  *URINARY PROBLEMS  *BOWEL PROBLEMS  UNUSUAL RASH Items with * indicate a potential emergency and should be followed up as soon as possible.  Feel free to call the clinic should you have any questions or concerns. The clinic phone number is [phone].  Please show the [facility] at check-in to the Emergency Department and triage nurse.

## 2018-03-25 NOTE — Progress Notes (Signed)
[facility] Follow up:    Michelle Ebbs, MD [place] [address]   DIAGNOSIS: Cancer Staging Malignant neoplasm of upper-inner quadrant of left breast in female, estrogen receptor positive ([place]) Staging form: Breast, AJCC 8th Edition - Clinical stage from 02/10/2018: Stage IIA (cT2, cN0, cM0, G3, ER: Positive, PR: Negative, HER2: Positive) - Unsigned Staging comments: Staged at breast conference on 3.13.19   SUMMARY OF ONCOLOGIC HISTORY:   Malignant neoplasm of upper-inner quadrant of left breast in female, estrogen receptor positive ([place])   02/03/2018 Initial Diagnosis    Left breast palpable lump at 1130 position 2.3 cm, at 11 o'clock position 6 mm which was benign; biopsy revealed IDC grade 3 ER 30%, PR 0%, Ki-67 30%, HER-2 positive ratio 5.62, copy #16.3, axillary lymph node biopsy benign concordant, T2 N0 stage II a clinical stage AJCC 8      02/18/2018 Breast MRI    Solitary mass in left breast upper outer quadrant 2.5 cm.  No additional enhancement in the right breast.  No adenopathy      02/26/2018 -  Neo-Adjuvant Chemotherapy    TCH Perjeta followed by Herceptin Perjeta maintenance       CURRENT THERAPY: TCHP  INTERVAL HISTORY: [name] 72 y.o. female returns for her second cycle of chemotherapy.  She will be getting Taxotere, Herceptin, Perjeta today.  She previously received Carboplatin, however this has been eliminated from her chemotherapy treatment.  She denies any peripheral neuropathy.  She is feeling much better.     Patient Active Problem List   Diagnosis Date Noted  . Port-A-Cath in place 02/26/2018  . Genetic testing 02/19/2018  . Family history of breast cancer   . Malignant neoplasm of upper-inner quadrant of left breast in female, estrogen receptor positive ([place]) 02/09/2018    is allergic to codeine and demerol [meperidine].  MEDICAL HISTORY: Past Medical History:  Diagnosis Date  . Anxiety   .  Arthritis   . Breast cancer ([place])   . Cough   . Family history of breast cancer   . Genetic testing 02/19/2018   Multi-Cancer panel (83 genes) @ [facility] - No pathogenic mutations detected  . GERD (gastroesophageal reflux disease)   . Hypertension   . RLS (restless legs syndrome)     SURGICAL HISTORY: Past Surgical History:  Procedure Laterality Date  . ABDOMINAL HYSTERECTOMY     1970's due to ectopic pregnancy  . BREAST BIOPSY Left 2016  . BREAST SURGERY     cyst removal  . CARPOMETACARPEL SUSPENSION PLASTY Right 10/30/2016   Procedure: SUSPENSION PLASTY abductor pollicis longus transfer excision trapezium right;  Surgeon: Daryll Brod, MD;  Location: [facility];  Service: Orthopedics;  Laterality: Right;  axillary block  SUSPENSION PLASTY abductor pollicis longus transfer excision trapezium right  . KIDNEY DONATION Left   . PORTACATH PLACEMENT Right 02/24/2018   Procedure: INSERTION OF RIGHT INTERNAL JUGULAR PORT-A-CATH WITH ULTRA SOUND GUIDANCE ERAS PATHWAY;  Surgeon: Erroll Luna, MD;  Location: [place];  Service: General;  Laterality: Right;  . WRIST SURGERY     cyst removal    SOCIAL HISTORY: Social History   Socioeconomic History  . Marital status: Married    Spouse name: Not on file  . Number of children: Not on file  . Years of education: Not on file  . Highest education level: Not on file  Occupational History  . Not on file  Social Needs  . Financial resource strain:  Not on file  . Food insecurity:    Worry: Not on file    Inability: Not on file  . Transportation needs:    Medical: Not on file    Non-medical: Not on file  Tobacco Use  . Smoking status: Current Every Day Smoker    Packs/day: 1.00    Years: 47.00    Pack years: 47.00    Types: Cigarettes  . Smokeless tobacco: Never Used  Substance and Sexual Activity  . Alcohol use: No  . Drug use: No  . Sexual activity: Not on file  Lifestyle  . Physical activity:    Days per week: Not  on file    Minutes per session: Not on file  . Stress: Not on file  Relationships  . Social connections:    Talks on phone: Not on file    Gets together: Not on file    Attends religious service: Not on file    Active member of club or organization: Not on file    Attends meetings of clubs or organizations: Not on file    Relationship status: Not on file  . Intimate partner violence:    Fear of current or ex partner: Not on file    Emotionally abused: Not on file    Physically abused: Not on file    Forced sexual activity: Not on file  Other Topics Concern  . Not on file  Social History Narrative  . Not on file    FAMILY HISTORY: Family History  Problem Relation Age of Onset  . Liver cancer Mother        deceased 97  . Lung cancer Father 3       deceased 46; smoker  . Stomach cancer Maternal Grandmother        deceased 23  . Cancer Paternal Uncle        deceased 71; unk. type  . Breast cancer Maternal Aunt        dx 11s; deceased 51s  . Breast cancer Maternal Aunt        dx 45s; deceased 68s  . Breast cancer Cousin        daughter of mat aunt with breast ca    Review of Systems  Constitutional: Negative for appetite change, chills, fatigue, fever and unexpected weight change.  HENT:   Negative for hearing loss, lump/mass, sore throat and trouble swallowing.   Eyes: Negative for eye problems and icterus.  Respiratory: Negative for chest tightness, cough and shortness of breath.   Cardiovascular: Negative for chest pain, leg swelling and palpitations.  Gastrointestinal: Negative for abdominal distention, abdominal pain, constipation, diarrhea, nausea and vomiting.  Endocrine: Negative for hot flashes.  Skin: Negative for itching and rash.  Neurological: Negative for dizziness, extremity weakness, headaches and numbness.  Hematological: Negative for adenopathy. Does not bruise/bleed easily.  Psychiatric/Behavioral: The patient is nervous/anxious.       PHYSICAL  EXAMINATION  ECOG PERFORMANCE STATUS: 1 - Symptomatic but completely ambulatory  Vitals:   03/25/18 0818  BP: 131/74  Pulse: 83  Resp: 18  Temp: 97.8 F (36.6 C)  SpO2: 100%    Physical Exam  Constitutional: She is oriented to person, place, and time and well-developed, well-nourished, and in no distress.  HENT:  Head: Normocephalic and atraumatic.  Mouth/Throat: Oropharynx is clear and moist. No oropharyngeal exudate.  Eyes: Pupils are equal, round, and reactive to light. No scleral icterus.  Neck: Neck supple.  Cardiovascular: Normal rate, regular rhythm  and normal heart sounds.  Pulmonary/Chest: Effort normal and breath sounds normal. No respiratory distress. She has no wheezes. She has no rales. She exhibits no tenderness.  Abdominal: Soft. Bowel sounds are normal. She exhibits no distension and no mass. There is no tenderness. There is no rebound and no guarding.  Musculoskeletal: She exhibits no edema.  Lymphadenopathy:    She has no cervical adenopathy.  Neurological: She is alert and oriented to person, place, and time.  Skin: Skin is warm and dry. No rash noted.  Psychiatric: Mood and affect normal.    LABORATORY DATA:  CBC    Component Value Date/Time   WBC 12.3 (H) 03/25/2018 0741   WBC 11.8 (H) 02/22/2018 1208   RBC 3.36 (L) 03/25/2018 0741   HGB 9.6 (L) 03/25/2018 0741   HCT 28.8 (L) 03/25/2018 0741   PLT 405 (H) 03/25/2018 0741   MCV 85.7 03/25/2018 0741   MCH 28.6 03/25/2018 0741   MCHC 33.3 03/25/2018 0741   RDW 14.9 (H) 03/25/2018 0741   LYMPHSABS 1.9 03/25/2018 0741   MONOABS 1.5 (H) 03/25/2018 0741   EOSABS 0.0 03/25/2018 0741   BASOSABS 0.0 03/25/2018 0741    CMP     Component Value Date/Time   NA 140 03/25/2018 0741   K 3.7 03/25/2018 0741   CL 108 03/25/2018 0741   CO2 21 (L) 03/25/2018 0741   GLUCOSE 104 03/25/2018 0741   BUN 13 03/25/2018 0741   CREATININE 0.84 03/25/2018 0741   CALCIUM 9.3 03/25/2018 0741   PROT 7.1 03/25/2018  0741   ALBUMIN 3.5 03/25/2018 0741   AST 14 03/25/2018 0741   ALT 11 03/25/2018 0741   ALKPHOS 82 03/25/2018 0741   BILITOT <0.2 (L) 03/25/2018 0741   GFRNONAA >60 03/25/2018 0741   GFRAA >60 03/25/2018 0741         ASSESSMENT and THERAPY PLAN:   Malignant neoplasm of upper-inner quadrant of left breast in female, estrogen receptor positive ([facility]) 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, copy #16.3, axillary lymph node biopsy benign concordant, T2 N0 stage II a clinical stage AJCC 8  Treatment plan: 1.Neoadjuvant chemotherapy with TCH Perjeta x6 cycles followed by Herceptin Perjeta maintenance for 1 year (Carbo omitted cycle 2 and forward due to inability to tolerate) 2.followed by breast conserving surgery 63fllowed by adjuvant radiation 4.Followed by adjuvant antiestrogen therapy with letrozole for 5-7 years ------------------------------------------------------------------------------------------------------------------------ Current treatment: Cycle 2 day 1 TCHP  BMeliyais feeling much improved than she did the last time that we have seen her.  She will proceed with her treatment today.  Her CBC was stable and I reviewed it with her in detail.  I increased her Lorazepam to 112mpo QHS PRN and hopefully that will help with her anxiety and inability to sleep.  She may need to consider effexor moving forward.  We did review the fact that she is planning on quitting smoking.  Her quit date is in may.  We reviewed how to take her smoking cessation medications once she is ready.         All questions were answered. The patient knows to call the clinic with any problems, questions or concerns. We can certainly see the patient much sooner if necessary.  A total of (30) minutes of face-to-face time was spent with this patient with  greater  than 50% of that time in counseling and care-coordination.  This note was electronically signed. Scot Dock, NP 03/25/2018

## 2018-03-25 NOTE — Telephone Encounter (Signed)
Gave avs and calendar ° °

## 2018-04-08 ENCOUNTER — Telehealth: Payer: Self-pay | Admitting: *Deleted

## 2018-04-08 ENCOUNTER — Other Ambulatory Visit: Payer: Medicare HMO

## 2018-04-08 ENCOUNTER — Ambulatory Visit: Payer: Medicare HMO | Admitting: Hematology and Oncology

## 2018-04-08 ENCOUNTER — Inpatient Hospital Stay: Payer: Medicare HMO | Attending: Hematology and Oncology | Admitting: Medical

## 2018-04-08 ENCOUNTER — Encounter: Payer: Self-pay | Admitting: Medical

## 2018-04-08 ENCOUNTER — Ambulatory Visit: Payer: Medicare HMO

## 2018-04-08 VITALS — BP 131/61 | HR 85 | Temp 98.8°F | Resp 17 | Ht 63.5 in | Wt 148.4 lb

## 2018-04-08 DIAGNOSIS — I10 Essential (primary) hypertension: Secondary | ICD-10-CM | POA: Diagnosis not present

## 2018-04-08 DIAGNOSIS — R197 Diarrhea, unspecified: Secondary | ICD-10-CM | POA: Diagnosis not present

## 2018-04-08 DIAGNOSIS — K219 Gastro-esophageal reflux disease without esophagitis: Secondary | ICD-10-CM | POA: Insufficient documentation

## 2018-04-08 DIAGNOSIS — Z170 Estrogen receptor positive status [ER+]: Secondary | ICD-10-CM | POA: Diagnosis not present

## 2018-04-08 DIAGNOSIS — R509 Fever, unspecified: Secondary | ICD-10-CM | POA: Diagnosis not present

## 2018-04-08 DIAGNOSIS — M62838 Other muscle spasm: Secondary | ICD-10-CM

## 2018-04-08 DIAGNOSIS — Z803 Family history of malignant neoplasm of breast: Secondary | ICD-10-CM | POA: Diagnosis not present

## 2018-04-08 DIAGNOSIS — M542 Cervicalgia: Secondary | ICD-10-CM | POA: Insufficient documentation

## 2018-04-08 DIAGNOSIS — D6481 Anemia due to antineoplastic chemotherapy: Secondary | ICD-10-CM | POA: Insufficient documentation

## 2018-04-08 DIAGNOSIS — C50212 Malignant neoplasm of upper-inner quadrant of left female breast: Secondary | ICD-10-CM | POA: Diagnosis not present

## 2018-04-08 DIAGNOSIS — R42 Dizziness and giddiness: Secondary | ICD-10-CM | POA: Diagnosis not present

## 2018-04-08 DIAGNOSIS — M129 Arthropathy, unspecified: Secondary | ICD-10-CM | POA: Insufficient documentation

## 2018-04-08 DIAGNOSIS — Z800 Family history of malignant neoplasm of digestive organs: Secondary | ICD-10-CM | POA: Insufficient documentation

## 2018-04-08 DIAGNOSIS — F17210 Nicotine dependence, cigarettes, uncomplicated: Secondary | ICD-10-CM | POA: Diagnosis not present

## 2018-04-08 DIAGNOSIS — E860 Dehydration: Secondary | ICD-10-CM | POA: Diagnosis not present

## 2018-04-08 DIAGNOSIS — F419 Anxiety disorder, unspecified: Secondary | ICD-10-CM | POA: Insufficient documentation

## 2018-04-08 DIAGNOSIS — R51 Headache: Secondary | ICD-10-CM | POA: Insufficient documentation

## 2018-04-08 DIAGNOSIS — Z5111 Encounter for antineoplastic chemotherapy: Secondary | ICD-10-CM

## 2018-04-08 DIAGNOSIS — R112 Nausea with vomiting, unspecified: Secondary | ICD-10-CM | POA: Diagnosis not present

## 2018-04-08 DIAGNOSIS — G2581 Restless legs syndrome: Secondary | ICD-10-CM | POA: Diagnosis not present

## 2018-04-08 DIAGNOSIS — Z801 Family history of malignant neoplasm of trachea, bronchus and lung: Secondary | ICD-10-CM

## 2018-04-08 DIAGNOSIS — R519 Headache, unspecified: Secondary | ICD-10-CM

## 2018-04-08 MED ORDER — CYCLOBENZAPRINE HCL 5 MG PO TABS: 5.0000 mg | ORAL_TABLET | Freq: Three times a day (TID) | ORAL | 0 refills | Status: DC | PRN

## 2018-04-08 MED FILL — CYCLOBENZAPRINE 5 MG TABLET: 5 | 10 days supply | Qty: 30 | Fill #0

## 2018-04-08 NOTE — Telephone Encounter (Signed)
Pt called with c/o Left sided HA x2 days and woke this am with c/o left sided neck pain and shoulder pain. No c/o shortness of breath and pain described as "sharp", "when you touch it you know it's there". Discussed symptoms with Mendel Ryder, NP informed pt to come in to [facility]. Called [facility] to give pt name and symptoms. Called pt and informed to come to [facility] for [facility]. Received verbal understanding.

## 2018-04-08 NOTE — Patient Instructions (Signed)

## 2018-04-09 NOTE — Progress Notes (Signed)
Symptoms Management Clinic Progress Note   [name] [id number] 1946/02/21 72 y.o.  [name] is managed by Dr. Nicholas Lose  Actively treated with chemotherapy: yes  Current Therapy: Taxotere, Herceptin, and Perjeta  Last Treated: 04/ 25 / 2019 (cycle 2, day 1)  Assessment: Plan:    Neck pain on left side - Plan: cyclobenzaprine (FLEXERIL) 5 MG tablet  Nonintractable headache, unspecified chronicity pattern, unspecified headache type - Plan: cyclobenzaprine (FLEXERIL) 5 MG tablet  Neck muscle spasm - Plan: cyclobenzaprine (FLEXERIL) 5 MG tablet   Left neck pain, Headache muscle spasms of the left neck: The patient was given a prescription for Flexeril 5 mg p.o. 3 times daily as needed for muscle spasms.  Please see After Visit Summary for patient specific instructions.  Future Appointments  Date Time Provider [place]  04/15/2018  8:30 AM [facility] LAB 2 [facility] None  04/15/2018  8:45 AM [facility] FLUSH NURSE [facility] None  04/15/2018  9:15 AM Nicholas Lose, MD [facility] None  04/15/2018 10:15 AM [facility] PROCEDURE 2 [facility] None  05/20/2018 11:00 AM [facility] ECHO 1-BUZZ [facility]  05/20/2018 12:00 PM Larey Dresser, MD [facility] None    No orders of the defined types were placed in this encounter.      Subjective:   Patient ID:  [name] is a 72 y.o. (DOB 03-Feb-1946) female.  Chief Complaint:  Chief Complaint  Patient presents with  . Pain  . Dizziness    HPI [name] is a 72 year old female with a history of a stage IIa ER positive, PR negative and HER-2/neu positive malignant neoplasm of the upper inner quadrant of the left breast.  The patient is managed by Dr. Nicholas Lose and is status post cycle 2, day 1 of Taxotere, Herceptin, and Perjeta which was dosed on 03/25/2018.  She presents to the office today with left neck and headaches for the past 1 to 2 days.  She reports that the pain and her left head  radiates to her left jaw onto the top of her shoulder and left upper chest.  She reports episodic dizziness and instability.  She reports her left arm has been shaky.  She has a family history of strokes and is concerned regarding this.  She has been taking Motrin and Tylenol without relief.  She reports having chills but no sweats.  She has noted her temperature to be as high as 100 at times.  She has no upper respiratory symptoms.  She denies any sick contacts at home.  She request information regarding living [place] and healthcare powers of attorney today.  Medications: I have reviewed the patient's current medications.  Allergies:  Allergies  Allergen Reactions  . Codeine Nausea And Vomiting  . Demerol [Meperidine] Nausea And Vomiting    Past Medical History:  Diagnosis Date  . Anxiety   . Arthritis   . Breast cancer ([place])   . Cough   . Family history of breast cancer   . Genetic testing 02/19/2018   Multi-Cancer panel (83 genes) @ [facility] - No pathogenic mutations detected  . GERD (gastroesophageal reflux disease)   . Hypertension   . RLS (restless legs syndrome)     Past Surgical History:  Procedure Laterality Date  . ABDOMINAL HYSTERECTOMY     1970's due to ectopic pregnancy  . BREAST BIOPSY Left 2016  . BREAST SURGERY     cyst removal  . CARPOMETACARPEL SUSPENSION PLASTY Right 10/30/2016   Procedure: SUSPENSION  PLASTY abductor pollicis longus transfer excision trapezium right;  Surgeon: Michelle Brod, MD;  Location: [place];  Service: Orthopedics;  Laterality: Right;  axillary block  SUSPENSION PLASTY abductor pollicis longus transfer excision trapezium right  . KIDNEY DONATION Left   . PORTACATH PLACEMENT Right 02/24/2018   Procedure: INSERTION OF RIGHT INTERNAL JUGULAR PORT-A-CATH WITH ULTRA SOUND GUIDANCE ERAS PATHWAY;  Surgeon: Michelle Luna, MD;  Location: [place];  Service: General;  Laterality: Right;  . WRIST SURGERY     cyst removal    Family  History  Problem Relation Age of Onset  . Liver cancer Mother        deceased 13  . Lung cancer Father 55       deceased 84; smoker  . Stomach cancer Maternal Grandmother        deceased 46  . Cancer Paternal Uncle        deceased 17; unk. type  . Breast cancer Maternal Aunt        dx 85s; deceased 41s  . Breast cancer Maternal Aunt        dx 28s; deceased 74s  . Breast cancer Cousin        daughter of mat aunt with breast ca    Social History   Socioeconomic History  . Marital status: Married    Spouse name: Not on file  . Number of children: Not on file  . Years of education: Not on file  . Highest education level: Not on file  Occupational History  . Not on file  Social Needs  . Financial resource strain: Not on file  . Food insecurity:    Worry: Not on file    Inability: Not on file  . Transportation needs:    Medical: Not on file    Non-medical: Not on file  Tobacco Use  . Smoking status: Current Every Day Smoker    Packs/day: 1.00    Years: 47.00    Pack years: 47.00    Types: Cigarettes  . Smokeless tobacco: Never Used  Substance and Sexual Activity  . Alcohol use: No  . Drug use: No  . Sexual activity: Not on file  Lifestyle  . Physical activity:    Days per week: Not on file    Minutes per session: Not on file  . Stress: Not on file  Relationships  . Social connections:    Talks on phone: Not on file    Gets together: Not on file    Attends religious service: Not on file    Active member of club or organization: Not on file    Attends meetings of clubs or organizations: Not on file    Relationship status: Not on file  . Intimate partner violence:    Fear of current or ex partner: Not on file    Emotionally abused: Not on file    Physically abused: Not on file    Forced sexual activity: Not on file  Other Topics Concern  . Not on file  Social History Narrative  . Not on file    Past Medical History, Surgical history, Social history, and  Family history were reviewed and updated as appropriate.   Please see review of systems for further details on the patient's review from today.   Review of Systems:  Review of Systems  Constitutional: Positive for chills. Negative for diaphoresis, fatigue and fever.  Eyes: Negative for visual disturbance.  Respiratory: Negative for cough, chest tightness and  shortness of breath.   Cardiovascular: Positive for chest pain (Left superior chest pain with radiation to the left neck, jaw, and head.). Negative for palpitations and leg swelling.  Musculoskeletal: Positive for neck pain.  Neurological: Positive for dizziness and headaches.    Objective:   Physical Exam:  BP 131/61 (BP Location: Left Arm, Patient Position: Sitting)   Pulse 85   Temp 98.8 F (37.1 C) (Oral)   Resp 17   Ht 5' 3.5" (1.613 m)   Wt 148 lb 6.4 oz (67.3 kg)   SpO2 100%   BMI 25.88 kg/m  ECOG: 0  Physical Exam  Constitutional: No distress.  HENT:  Head: Normocephalic and atraumatic.  Cardiovascular: Normal rate, regular rhythm and normal heart sounds. Exam reveals no gallop and no friction rub.  No murmur heard. Pulmonary/Chest: Effort normal and breath sounds normal. No respiratory distress. She has no wheezes. She has no rales.  Musculoskeletal: She exhibits tenderness (Tenderness over the superior left SCM muscle.  There is an area that appears to be a muscle spasm.).  Neurological: She is alert. She exhibits normal muscle tone. Coordination normal.  Upper extremity and lower extremity strength 5/5 bilaterally.  Skin: Skin is warm and dry. No rash noted. She is not diaphoretic. No erythema.  Psychiatric: She has a normal mood and affect. Her behavior is normal. Judgment and thought content normal.    Lab Review:     Component Value Date/Time   NA 140 03/25/2018 0741   K 3.7 03/25/2018 0741   CL 108 03/25/2018 0741   CO2 21 (L) 03/25/2018 0741   GLUCOSE 104 03/25/2018 0741   BUN 13 03/25/2018 0741     CREATININE 0.84 03/25/2018 0741   CALCIUM 9.3 03/25/2018 0741   PROT 7.1 03/25/2018 0741   ALBUMIN 3.5 03/25/2018 0741   AST 14 03/25/2018 0741   ALT 11 03/25/2018 0741   ALKPHOS 82 03/25/2018 0741   BILITOT <0.2 (L) 03/25/2018 0741   GFRNONAA >60 03/25/2018 0741   GFRAA >60 03/25/2018 0741       Component Value Date/Time   WBC 12.3 (H) 03/25/2018 0741   WBC 11.8 (H) 02/22/2018 1208   RBC 3.36 (L) 03/25/2018 0741   HGB 9.6 (L) 03/25/2018 0741   HCT 28.8 (L) 03/25/2018 0741   PLT 405 (H) 03/25/2018 0741   MCV 85.7 03/25/2018 0741   MCH 28.6 03/25/2018 0741   MCHC 33.3 03/25/2018 0741   RDW 14.9 (H) 03/25/2018 0741   LYMPHSABS 1.9 03/25/2018 0741   MONOABS 1.5 (H) 03/25/2018 0741   EOSABS 0.0 03/25/2018 0741   BASOSABS 0.0 03/25/2018 0741   -------------------------------  Imaging from last 24 hours (if applicable):  Radiology interpretation: No results found.

## 2018-04-15 ENCOUNTER — Inpatient Hospital Stay: Payer: Medicare HMO

## 2018-04-15 ENCOUNTER — Inpatient Hospital Stay (HOSPITAL_BASED_OUTPATIENT_CLINIC_OR_DEPARTMENT_OTHER): Payer: Medicare HMO | Admitting: Hematology and Oncology

## 2018-04-15 ENCOUNTER — Other Ambulatory Visit: Payer: Self-pay | Admitting: Hematology and Oncology

## 2018-04-15 ENCOUNTER — Telehealth: Payer: Self-pay | Admitting: Hematology and Oncology

## 2018-04-15 ENCOUNTER — Other Ambulatory Visit: Payer: Self-pay

## 2018-04-15 ENCOUNTER — Inpatient Hospital Stay (HOSPITAL_BASED_OUTPATIENT_CLINIC_OR_DEPARTMENT_OTHER): Payer: Medicare HMO | Admitting: Medical

## 2018-04-15 VITALS — BP 120/54 | HR 71 | Temp 98.0°F | Resp 18

## 2018-04-15 DIAGNOSIS — Z5111 Encounter for antineoplastic chemotherapy: Secondary | ICD-10-CM | POA: Diagnosis not present

## 2018-04-15 DIAGNOSIS — C50212 Malignant neoplasm of upper-inner quadrant of left female breast: Secondary | ICD-10-CM

## 2018-04-15 DIAGNOSIS — Z170 Estrogen receptor positive status [ER+]: Principal | ICD-10-CM

## 2018-04-15 DIAGNOSIS — Z95828 Presence of other vascular implants and grafts: Secondary | ICD-10-CM

## 2018-04-15 DIAGNOSIS — T8090XA Unspecified complication following infusion and therapeutic injection, initial encounter: Secondary | ICD-10-CM

## 2018-04-15 LAB — CBC WITH DIFFERENTIAL (CANCER CENTER ONLY)
Basophils Absolute: 0 10*3/uL (ref 0.0–0.1)
Basophils Relative: 0 %
EOS PCT: 0 %
Eosinophils Absolute: 0 10*3/uL (ref 0.0–0.5)
HCT: 32.9 % — ABNORMAL LOW (ref 34.8–46.6)
HEMOGLOBIN: 10.9 g/dL — AB (ref 11.6–15.9)
LYMPHS ABS: 0.8 10*3/uL — AB (ref 0.9–3.3)
LYMPHS PCT: 17 %
MCH: 28.5 pg (ref 25.1–34.0)
MCHC: 33.3 g/dL (ref 31.5–36.0)
MCV: 85.8 fL (ref 79.5–101.0)
Monocytes Absolute: 0.3 10*3/uL (ref 0.1–0.9)
Monocytes Relative: 8 %
Neutro Abs: 3.3 10*3/uL (ref 1.5–6.5)
Neutrophils Relative %: 75 %
PLATELETS: 378 10*3/uL (ref 145–400)
RBC: 3.83 MIL/uL (ref 3.70–5.45)
RDW: 14.5 % (ref 11.2–14.5)
WBC: 4.5 10*3/uL (ref 3.9–10.3)

## 2018-04-15 LAB — CMP (CANCER CENTER ONLY)
ALBUMIN: 3.7 g/dL (ref 3.5–5.0)
ALT: 14 U/L (ref 0–55)
AST: 17 U/L (ref 5–34)
Alkaline Phosphatase: 90 U/L (ref 40–150)
Anion gap: 11 (ref 3–11)
BUN: 18 mg/dL (ref 7–26)
CHLORIDE: 104 mmol/L (ref 98–109)
CO2: 21 mmol/L — AB (ref 22–29)
Calcium: 9.4 mg/dL (ref 8.4–10.4)
Creatinine: 1.17 mg/dL — ABNORMAL HIGH (ref 0.60–1.10)
GFR, EST NON AFRICAN AMERICAN: 46 mL/min — AB (ref >60)
GFR, Est AFR Am: 53 mL/min — ABNORMAL LOW (ref >60)
Glucose, Bld: 124 mg/dL (ref 70–140)
POTASSIUM: 4.4 mmol/L (ref 3.5–5.1)
SODIUM: 136 mmol/L (ref 136–145)
Total Bilirubin: <0.2 mg/dL — ABNORMAL LOW (ref 0.2–1.2)
Total Protein: 7.5 g/dL (ref 6.4–8.3)

## 2018-04-15 MED ORDER — SODIUM CHLORIDE 0.9% FLUSH
10.0000 mL | INTRAVENOUS | Status: DC | PRN
  Administered 2018-04-15: 10 mL
  Filled 2018-04-15: qty 10

## 2018-04-15 MED ORDER — TRASTUZUMAB CHEMO 150 MG IV SOLR
6.0000 mg/kg | Freq: Once | INTRAVENOUS | Status: AC
  Administered 2018-04-15: 399 mg via INTRAVENOUS
  Filled 2018-04-15: qty 19

## 2018-04-15 MED ORDER — SODIUM CHLORIDE 0.9 % IV SOLN
420.0000 mg | Freq: Once | INTRAVENOUS | Status: AC
  Administered 2018-04-15: 420 mg via INTRAVENOUS
  Filled 2018-04-15: qty 14

## 2018-04-15 MED ORDER — PALONOSETRON HCL INJECTION 0.25 MG/5ML
0.2500 mg | Freq: Once | INTRAVENOUS | Status: AC
  Administered 2018-04-15: 0.25 mg via INTRAVENOUS

## 2018-04-15 MED ORDER — LORAZEPAM 0.5 MG PO TABS: 0.5000 mg | ORAL_TABLET | Freq: Every evening | ORAL | 0 refills | Status: DC | PRN

## 2018-04-15 MED ORDER — DIPHENHYDRAMINE HCL 25 MG PO CAPS
ORAL_CAPSULE | ORAL | Status: AC
  Filled 2018-04-15: qty 2

## 2018-04-15 MED ORDER — SODIUM CHLORIDE 0.9 % IV SOLN
65.0000 mg/m2 | Freq: Once | INTRAVENOUS | Status: AC
  Administered 2018-04-15: 110 mg via INTRAVENOUS
  Filled 2018-04-15: qty 11

## 2018-04-15 MED ORDER — SODIUM CHLORIDE 0.9% FLUSH
10.0000 mL | Freq: Once | INTRAVENOUS | Status: AC
  Administered 2018-04-15: 10 mL
  Filled 2018-04-15: qty 10

## 2018-04-15 MED ORDER — SODIUM CHLORIDE 0.9 % IV SOLN
Freq: Once | INTRAVENOUS | Status: AC
  Administered 2018-04-15: 09:00:00 via INTRAVENOUS

## 2018-04-15 MED ORDER — SODIUM CHLORIDE 0.9 % IV SOLN
Freq: Once | INTRAVENOUS | Status: AC
  Administered 2018-04-15: 10:00:00 via INTRAVENOUS
  Filled 2018-04-15: qty 5

## 2018-04-15 MED ORDER — PALONOSETRON HCL INJECTION 0.25 MG/5ML
INTRAVENOUS | Status: AC
  Filled 2018-04-15: qty 5

## 2018-04-15 MED ORDER — DIPHENHYDRAMINE HCL 25 MG PO CAPS
50.0000 mg | ORAL_CAPSULE | Freq: Once | ORAL | Status: AC
  Administered 2018-04-15: 50 mg via ORAL

## 2018-04-15 MED ORDER — HEPARIN SOD (PORK) LOCK FLUSH 100 UNIT/ML IV SOLN
500.0000 [IU] | Freq: Once | INTRAVENOUS | Status: AC | PRN
  Administered 2018-04-15: 500 [IU]
  Filled 2018-04-15: qty 5

## 2018-04-15 MED ORDER — ACETAMINOPHEN 325 MG PO TABS
650.0000 mg | ORAL_TABLET | Freq: Once | ORAL | Status: AC
  Administered 2018-04-15: 650 mg via ORAL

## 2018-04-15 MED ORDER — ACETAMINOPHEN 325 MG PO TABS
ORAL_TABLET | ORAL | Status: AC
  Filled 2018-04-15: qty 2

## 2018-04-15 MED ORDER — FAMOTIDINE IN NACL 20-0.9 MG/50ML-% IV SOLN
20.0000 mg | Freq: Once | INTRAVENOUS | Status: AC | PRN
  Administered 2018-04-15: 20 mg via INTRAVENOUS

## 2018-04-15 MED FILL — LORazepam 0.5 MG TABS: 0.5 | 30 days supply | Qty: 30 | Fill #0

## 2018-04-15 NOTE — Progress Notes (Signed)
Herceptin paused at 1100. Complaining of redness to right forearm, no other complaints. Sandi Mealy, PA called and to room. Pepcid given per Sandi Mealy, PA. Per Sandi Mealy, PA restart Herceptin 10 minutes after Pepcid infused. Redness to arm gone and no other complaints when Herceptin restarted.

## 2018-04-15 NOTE — Patient Instructions (Signed)
[facility] Discharge Instructions for Patients Receiving Chemotherapy  Today you received the following chemotherapy agents Herceptin, Perjeta and Taxotere.  To help prevent nausea and vomiting after your treatment, we encourage you to take your nausea medication as directed   If you develop nausea and vomiting that is not controlled by your nausea medication, call the clinic.   BELOW ARE SYMPTOMS THAT SHOULD BE REPORTED IMMEDIATELY:  *FEVER GREATER THAN 100.5 F  *CHILLS WITH OR WITHOUT FEVER  NAUSEA AND VOMITING THAT IS NOT CONTROLLED WITH YOUR NAUSEA MEDICATION  *UNUSUAL SHORTNESS OF BREATH  *UNUSUAL BRUISING OR BLEEDING  TENDERNESS IN MOUTH AND THROAT WITH OR WITHOUT PRESENCE OF ULCERS  *URINARY PROBLEMS  *BOWEL PROBLEMS  UNUSUAL RASH Items with * indicate a potential emergency and should be followed up as soon as possible.  Feel free to call the clinic should you have any questions or concerns. The clinic phone number is [phone].  Please show the [facility] at check-in to the Emergency Department and triage nurse.

## 2018-04-15 NOTE — Progress Notes (Signed)
Patient Care Team: [name], MD as PCP - General (Internal Medicine)  DIAGNOSIS:  Encounter Diagnosis  Name Primary?  . Malignant neoplasm of upper-inner quadrant of left breast in female, estrogen receptor positive ([place])     SUMMARY OF ONCOLOGIC HISTORY:   Malignant neoplasm of upper-inner quadrant of left breast in female, estrogen receptor positive ([place])   02/03/2018 Initial Diagnosis    Left breast palpable lump at 1130 position 2.3 cm, at 11 o'clock position 6 mm which was benign; biopsy revealed IDC grade 3 ER 30%, PR 0%, Ki-67 30%, HER-2 positive ratio 5.62, copy #16.3, axillary lymph node biopsy benign concordant, T2 N0 stage II a clinical stage AJCC 8      02/18/2018 Breast MRI    Solitary mass in left breast upper outer quadrant 2.5 cm.  No additional enhancement in the right breast.  No adenopathy      02/26/2018 -  Neo-Adjuvant Chemotherapy    TCH Perjeta followed by Herceptin Perjeta maintenance       CHIEF COMPLIANT: Cycle 3 TCH Perjeta  INTERVAL HISTORY: [name] is a 72 year old with above-mentioned history of left breast cancer currently on neoadjuvant chemotherapy with TCH Perjeta and today is cycle 3.  She tolerated cycle 2 much better than cycle 1.  She did not have any diarrhea at this time.  She had one episode of nausea and vomiting She denies any neuropathy. she is eating better and feeling a lot better.  REVIEW OF SYSTEMS:   Constitutional: Denies fevers, chills or abnormal weight loss Eyes: Denies blurriness of vision Ears, nose, mouth, throat, and face: Denies mucositis or sore throat Respiratory: Denies cough, dyspnea or wheezes Cardiovascular: Denies palpitation, chest discomfort Gastrointestinal:  Denies nausea, heartburn or change in bowel habits Skin: Denies abnormal skin rashes Lymphatics: Denies new lymphadenopathy or easy bruising Neurological:Denies numbness, tingling or new weaknesses Behavioral/Psych: Mood is stable, no new  changes  Extremities: No lower extremity edema All other systems were reviewed with the patient and are negative.  I have reviewed the past medical history, past surgical history, social history and family history with the patient and they are unchanged from previous note.  ALLERGIES:  is allergic to codeine and demerol [meperidine].  MEDICATIONS:  Current Outpatient Medications  Medication Sig Dispense Refill  . amLODipine (NORVASC) 10 MG tablet Take 10 mg by mouth daily.     Marland Kitchen amoxicillin-clavulanate (AUGMENTIN) 875-125 MG tablet Take 1 tablet by mouth 2 (two) times daily. 14 tablet 0  . buPROPion (WELLBUTRIN SR) 150 MG 12 hr tablet Start one week before quit date. Take 1 tab daily x 3 days, then 1 tab BID thereafter. (Patient not taking: Reported on 03/25/2018) 60 tablet 2  . cholestyramine (QUESTRAN) 4 g packet Take 1 packet (4 g total) by mouth 3 (three) times daily. 60 each 12  . clotrimazole-betamethasone (LOTRISONE) cream Apply 1 application topically 2 (two) times daily. 30 g 0  . cyclobenzaprine (FLEXERIL) 5 MG tablet Take 1 tablet (5 mg total) by mouth 3 (three) times daily as needed for muscle spasms. 30 tablet 0  . dexamethasone (DECADRON) 4 MG tablet Take 1 tablet (4 mg total) by mouth daily. Take 1 tablet day before chemo and take 1 tablet day after chemo 12 tablet 0  . diphenoxylate-atropine (LOMOTIL) 2.5-0.025 MG tablet Take 1 tablet by mouth 4 (four) times daily as needed for diarrhea or loose stools. 60 tablet 3  . hydrochlorothiazide (HYDRODIURIL) 12.5 MG tablet Take 12.5 mg by  mouth daily.     . hydrOXYzine (ATARAX/VISTARIL) 50 MG tablet Take 50 mg by mouth 3 (three) times daily as needed for anxiety.     Marland Kitchen ibuprofen (ADVIL,MOTRIN) 800 MG tablet Take 1 tablet (800 mg total) by mouth every 8 (eight) hours as needed. 30 tablet 0  . latanoprost (XALATAN) 0.005 % ophthalmic solution Place 1 drop into both eyes at bedtime.     . lidocaine-prilocaine (EMLA) cream Apply to  affected area once 30 g 3  . LORazepam (ATIVAN) 0.5 MG tablet Take 1 tablet (0.5 mg total) by mouth at bedtime as needed for sleep. 30 tablet 0  . nicotine (NICODERM CQ - DOSED IN MG/24 HOURS) 14 mg/24hr patch Place 1 patch (14 mg total) onto the skin daily. Apply 21 mg patch daily x 6 wk, then 37m patch daily x 2 wk, then 7 mg patch daily x 2 wk (Patient not taking: Reported on 03/16/2018) 14 patch 0  . nicotine (NICODERM CQ - DOSED IN MG/24 HOURS) 21 mg/24hr patch Place 1 patch (21 mg total) onto the skin daily. Apply 21 mg patch daily x 6 wk, then 157mpatch daily x 2 wk, then 7 mg patch daily x 2 wk (Patient not taking: Reported on 03/16/2018) 14 patch 2  . nicotine (NICODERM CQ - DOSED IN MG/24 HR) 7 mg/24hr patch Place 1 patch (7 mg total) onto the skin daily. Apply 21 mg patch daily x 6 wk, then 1447match daily x 2 wk, then 7 mg patch daily x 2 wk (Patient not taking: Reported on 03/16/2018) 14 patch 0  . omeprazole (PRILOSEC) 20 MG capsule Take 20 mg by mouth 2 (two) times daily before a meal.     . ondansetron (ZOFRAN) 8 MG tablet Take 1 tablet (8 mg total) by mouth 2 (two) times daily as needed for refractory nausea / vomiting. 30 tablet 1  . oxyCODONE (OXY IR/ROXICODONE) 5 MG immediate release tablet Take 1 tablet (5 mg total) by mouth every 6 (six) hours as needed for severe pain. (Patient not taking: Reported on 04/08/2018) 10 tablet 0  . pramipexole (MIRAPEX) 0.25 MG tablet Take 0.25-1 mg by mouth at bedtime.     . prochlorperazine (COMPAZINE) 10 MG tablet Take 1 tablet (10 mg total) by mouth every 6 (six) hours as needed (Nausea or vomiting). 30 tablet 1   No current facility-administered medications for this visit.     PHYSICAL EXAMINATION: ECOG PERFORMANCE STATUS: 1 - Symptomatic but completely ambulatory  Vitals:   04/15/18 0849  BP: (!) 137/92  Pulse: 94  Resp: 18  Temp: 98.1 F (36.7 C)  SpO2: 100%   Filed Weights   04/15/18 0849  Weight: 143 lb 9.6 oz (65.1 kg)     GENERAL:alert, no distress and comfortable SKIN: skin color, texture, turgor are normal, no rashes or significant lesions EYES: normal, Conjunctiva are pink and non-injected, sclera clear OROPHARYNX:no exudate, no erythema and lips, buccal mucosa, and tongue normal  NECK: supple, thyroid normal size, non-tender, without nodularity LYMPH:  no palpable lymphadenopathy in the cervical, axillary or inguinal LUNGS: clear to auscultation and percussion with normal breathing effort HEART: regular rate & rhythm and no murmurs and no lower extremity edema ABDOMEN:abdomen soft, non-tender and normal bowel sounds MUSCULOSKELETAL:no cyanosis of digits and no clubbing  NEURO: alert & oriented x 3 with fluent speech, no focal motor/sensory deficits EXTREMITIES: No lower extremity edema  LABORATORY DATA:  I have reviewed the data as listed CMP Latest  Ref Rng & Units 03/25/2018 03/16/2018 03/04/2018  Glucose 70 - 140 mg/dL 104 105(H) 114  BUN 7 - 26 mg/dL _0 Creatinine 0.60 - 1.10 mg/dL 0.84 0.98 1.00  Sodium 136 - 145 mmol/L 140 139 136  Potassium 3.5 - 5.1 mmol/L 3.7 3.6 4.5  Chloride 98 - 109 mmol/L 108 106 103  CO2 22 - 29 mmol/L 21(L) 21(L) 24  Calcium 8.4 - 10.4 mg/dL 9.3 8.5(L) 9.8  Total Protein 6.4 - 8.3 g/dL 7.1 6.9 7.7  Total Bilirubin 0.2 - 1.2 mg/dL <0.2(L) 0.6 0.5  Alkaline Phos 40 - 150 U/L 82 74 111  AST 5 - 34 U/L _1 ALT 0 - 55 U/L _2 Lab Results  Component Value Date   WBC 4.5 04/15/2018   HGB 10.9 (L) 04/15/2018   HCT 32.9 (L) 04/15/2018   MCV 85.8 04/15/2018   PLT 378 04/15/2018   NEUTROABS 3.3 04/15/2018    ASSESSMENT & PLAN:  Malignant neoplasm of upper-inner quadrant of left breast in female, estrogen receptor positive ([facility]) 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, copy #16.3, axillary lymph node  biopsy benign concordant, T2 N0 stage II a clinical stage AJCC 8  Treatment plan: 1.Neoadjuvant chemotherapy with TCH Perjeta x6 cycles followed by Herceptin Perjeta maintenance for 1 year 2.followed by breast conserving surgery 47fllowed by adjuvant radiation 4.Followed by adjuvant antiestrogen therapy with letrozole for 5-7 years ------------------------------------------------------------------------------------------------------------------------ Current treatment: Cycle 3 day 1TCHP Chemo Toxicities: 1.  Diarrhea: On Imodium and Lomotil, resolved 2. Nausea:  Improved, only one episode with this round 3 chemotherapy-induced anemia: Hemoglobin has improved slightly..  4.  Alopecia 5.  Fatigue  Labs have been reviewed. Monitoring closely for chemo toxicities Return to clinic in 3 weeks for cycle 4   No orders of the defined types were placed in this encounter.  The patient has a good understanding of the overall plan. she agrees with it. she will call with any problems that may develop before the next visit here.   VHarriette Ohara MD 04/15/18

## 2018-04-15 NOTE — Telephone Encounter (Signed)
Appointments scheduled AVS/Calendar printed per 5/16 los °

## 2018-04-15 NOTE — Assessment & Plan Note (Signed)
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, copy #16.3, axillary lymph node biopsy benign concordant, T2 N0 stage II a clinical stage AJCC 8  Treatment plan: 1.Neoadjuvant chemotherapy with TCH Perjeta x6 cycles followed by Herceptin Perjeta maintenance for 1 year 2.followed by breast conserving surgery 69fllowed by adjuvant radiation 4.Followed by adjuvant antiestrogen therapy with letrozole for 5-7 years ------------------------------------------------------------------------------------------------------------------------ Current treatment: Cycle 3 day 1TCHP Chemo Toxicities: 1.  Diarrhea: On Imodium and Lomotil  2. Nausea: Improved   Labs have been reviewed. Monitoring closely for chemo toxicities Return to clinic in 3 weeks for cycle 4

## 2018-04-16 NOTE — Progress Notes (Signed)
   DATE: 04/15/2018      X CHEMO/IMMUNOTHERAPY REACTION            MD: Lindi Adie   AGENT/BLOOD PRODUCT RECEIVING TODAY:   Herceptin, Perjeta, and Taxotere  AGENT/BLOOD PRODUCT RECEIVING IMMEDIATELY PRIOR TO REACTION: Herceptin  VS: BP:     116/61 p:       78 SPO2:       100% on room air         REACTION(S): Erythema of the right forearm and fingertips bilaterally.  PREMEDS: Aloxi 0.25 mg IV, Tylenol 650 mg p.o., Benadryl 50 mg p.o., dexamethasone 4 mg IV, and Emend  INTERVENTION: Pepcid 20 mg IV x1   Review of Systems  Constitutional: Negative for diaphoresis.  HENT: Negative for trouble swallowing.   Respiratory: Negative for cough, choking, chest tightness, shortness of breath and wheezing.   Cardiovascular: Negative for chest pain and palpitations.  Gastrointestinal: Negative for nausea and vomiting.  Musculoskeletal: Negative for back pain.  Skin: Positive for color change. Negative for rash.       Erythema of the right forearm with bilateral erythema of the fingertips.  Neurological: Negative for dizziness, light-headedness and headaches.    Physical Exam  Constitutional: No distress.  HENT:  Head: Normocephalic and atraumatic.  Cardiovascular: Normal rate, regular rhythm and normal heart sounds. Exam reveals no gallop and no friction rub.  No murmur heard. Pulmonary/Chest: Effort normal and breath sounds normal. No respiratory distress. She has no wheezes. She has no rales.  Neurological: She is alert.  Skin: Skin is warm and dry. No rash noted. She is not diaphoretic. No erythema.     OUTCOME:  Patient responded to intervention. Chemo/Immunotherapy restarted and completed    Sandi Mealy, MHS, PA-C

## 2018-04-22 ENCOUNTER — Telehealth: Payer: Self-pay

## 2018-04-22 ENCOUNTER — Inpatient Hospital Stay: Payer: Medicare HMO

## 2018-04-22 ENCOUNTER — Inpatient Hospital Stay (HOSPITAL_BASED_OUTPATIENT_CLINIC_OR_DEPARTMENT_OTHER): Payer: Medicare HMO | Admitting: Hematology and Oncology

## 2018-04-22 ENCOUNTER — Other Ambulatory Visit: Payer: Self-pay

## 2018-04-22 VITALS — BP 109/71 | HR 110 | Temp 98.6°F | Resp 19 | Ht 63.5 in | Wt 138.7 lb

## 2018-04-22 DIAGNOSIS — Z95828 Presence of other vascular implants and grafts: Secondary | ICD-10-CM

## 2018-04-22 DIAGNOSIS — Z170 Estrogen receptor positive status [ER+]: Secondary | ICD-10-CM

## 2018-04-22 DIAGNOSIS — T451X5A Adverse effect of antineoplastic and immunosuppressive drugs, initial encounter: Principal | ICD-10-CM

## 2018-04-22 DIAGNOSIS — M129 Arthropathy, unspecified: Secondary | ICD-10-CM

## 2018-04-22 DIAGNOSIS — C50212 Malignant neoplasm of upper-inner quadrant of left female breast: Secondary | ICD-10-CM

## 2018-04-22 DIAGNOSIS — R509 Fever, unspecified: Secondary | ICD-10-CM | POA: Diagnosis not present

## 2018-04-22 DIAGNOSIS — R42 Dizziness and giddiness: Secondary | ICD-10-CM | POA: Diagnosis not present

## 2018-04-22 DIAGNOSIS — Z5111 Encounter for antineoplastic chemotherapy: Secondary | ICD-10-CM

## 2018-04-22 DIAGNOSIS — D6481 Anemia due to antineoplastic chemotherapy: Secondary | ICD-10-CM | POA: Diagnosis not present

## 2018-04-22 DIAGNOSIS — R112 Nausea with vomiting, unspecified: Secondary | ICD-10-CM | POA: Insufficient documentation

## 2018-04-22 DIAGNOSIS — K521 Toxic gastroenteritis and colitis: Secondary | ICD-10-CM | POA: Insufficient documentation

## 2018-04-22 DIAGNOSIS — G2581 Restless legs syndrome: Secondary | ICD-10-CM

## 2018-04-22 DIAGNOSIS — F17210 Nicotine dependence, cigarettes, uncomplicated: Secondary | ICD-10-CM

## 2018-04-22 DIAGNOSIS — I10 Essential (primary) hypertension: Secondary | ICD-10-CM

## 2018-04-22 DIAGNOSIS — Z800 Family history of malignant neoplasm of digestive organs: Secondary | ICD-10-CM

## 2018-04-22 DIAGNOSIS — R197 Diarrhea, unspecified: Secondary | ICD-10-CM | POA: Diagnosis not present

## 2018-04-22 DIAGNOSIS — K219 Gastro-esophageal reflux disease without esophagitis: Secondary | ICD-10-CM | POA: Diagnosis not present

## 2018-04-22 DIAGNOSIS — E860 Dehydration: Secondary | ICD-10-CM

## 2018-04-22 DIAGNOSIS — Z803 Family history of malignant neoplasm of breast: Secondary | ICD-10-CM

## 2018-04-22 DIAGNOSIS — F419 Anxiety disorder, unspecified: Secondary | ICD-10-CM

## 2018-04-22 DIAGNOSIS — Z801 Family history of malignant neoplasm of trachea, bronchus and lung: Secondary | ICD-10-CM

## 2018-04-22 MED ORDER — PALONOSETRON HCL INJECTION 0.25 MG/5ML
0.2500 mg | Freq: Once | INTRAVENOUS | Status: AC
  Administered 2018-04-22: 0.25 mg via INTRAVENOUS

## 2018-04-22 MED ORDER — POTASSIUM CHLORIDE 10 MEQ/100ML IV SOLN
10.0000 meq | Freq: Once | INTRAVENOUS | Status: AC
  Administered 2018-04-22: 10 meq via INTRAVENOUS
  Filled 2018-04-22: qty 100

## 2018-04-22 MED ORDER — SODIUM CHLORIDE 0.9 % IV SOLN
Freq: Once | INTRAVENOUS | Status: AC
  Administered 2018-04-22: 16:00:00 via INTRAVENOUS

## 2018-04-22 MED ORDER — PALONOSETRON HCL INJECTION 0.25 MG/5ML
INTRAVENOUS | Status: AC
  Filled 2018-04-22: qty 5

## 2018-04-22 MED ORDER — HEPARIN SOD (PORK) LOCK FLUSH 100 UNIT/ML IV SOLN
500.0000 [IU] | Freq: Once | INTRAVENOUS | Status: AC
  Administered 2018-04-22: 500 [IU]
  Filled 2018-04-22: qty 5

## 2018-04-22 MED ORDER — SODIUM CHLORIDE 0.9% FLUSH
10.0000 mL | Freq: Once | INTRAVENOUS | Status: AC
  Administered 2018-04-22: 10 mL
  Filled 2018-04-22: qty 10

## 2018-04-22 NOTE — Telephone Encounter (Signed)
Returned pt's call, she reports she is having nausea, vomiting, diarrhea and left side abd pain, since weekend.  Said on Monday she started feeling like she was going to pass out and now she has primarily been lying on couch, vomited three times today, hasn't been eating, tried -can't keep anything down, can't keep meds down.  She reports she feels out of breath.  First recommended ER visit as our symptom management closes at 3 pm today.  Then notified Dr Lindi Adie and he will see her here and then Infusion Tanya RN said they can give pt IV fluids in infusion this afternoon between 3-4 pm.  Pt agreeable.  Sent urgent message to scheduling.  Pt's husband bringing her.

## 2018-04-22 NOTE — Patient Instructions (Signed)
Dehydration, Adult Dehydration is a condition in which there is not enough fluid or water in the body. This happens when you lose more fluids than you take in. Important organs, such as the kidneys, brain, and heart, cannot function without a proper amount of fluids. Any loss of fluids from the body can lead to dehydration. Dehydration can range from mild to severe. This condition should be treated right away to prevent it from becoming severe. What are the causes? This condition may be caused by:  Vomiting.  Diarrhea.  Excessive sweating, such as from heat exposure or exercise.  Not drinking enough fluid, especially: ? When ill. ? While doing activity that requires a lot of energy.  Excessive urination.  Fever.  Infection.  Certain medicines, such as medicines that cause the body to lose excess fluid (diuretics).  Inability to access safe drinking water.  Reduced physical ability to get adequate water and food.  What increases the risk? This condition is more likely to develop in people:  Who have a poorly controlled long-term (chronic) illness, such as diabetes, heart disease, or kidney disease.  Who are age 65 or older.  Who are disabled.  Who live in a place with high altitude.  Who play endurance sports.  What are the signs or symptoms? Symptoms of mild dehydration may include:  Thirst.  Dry lips.  Slightly dry mouth.  Dry, warm skin.  Dizziness. Symptoms of moderate dehydration may include:  Very dry mouth.  Muscle cramps.  Dark urine. Urine may be the color of tea.  Decreased urine production.  Decreased tear production.  Heartbeat that is irregular or faster than normal (palpitations).  Headache.  Light-headedness, especially when you stand up from a sitting position.  Fainting (syncope). Symptoms of severe dehydration may include:  Changes in skin, such as: ? Cold and clammy skin. ? Blotchy (mottled) or pale skin. ? Skin that does  not quickly return to normal after being lightly pinched and released (poor skin turgor).  Changes in body fluids, such as: ? Extreme thirst. ? No tear production. ? Inability to sweat when body temperature is high, such as in hot weather. ? Very little urine production.  Changes in vital signs, such as: ? Weak pulse. ? Pulse that is more than 100 beats a minute when sitting still. ? Rapid breathing. ? Low blood pressure.  Other changes, such as: ? Sunken eyes. ? Cold hands and feet. ? Confusion. ? Lack of energy (lethargy). ? Difficulty waking up from sleep. ? Short-term weight loss. ? Unconsciousness. How is this diagnosed? This condition is diagnosed based on your symptoms and a physical exam. Blood and urine tests may be done to help confirm the diagnosis. How is this treated? Treatment for this condition depends on the severity. Mild or moderate dehydration can often be treated at home. Treatment should be started right away. Do not wait until dehydration becomes severe. Severe dehydration is an emergency and it needs to be treated in a hospital. Treatment for mild dehydration may include:  Drinking more fluids.  Replacing salts and minerals in your blood (electrolytes) that you may have lost. Treatment for moderate dehydration may include:  Drinking an oral rehydration solution (ORS). This is a drink that helps you replace fluids and electrolytes (rehydrate). It can be found at pharmacies and retail stores. Treatment for severe dehydration may include:  Receiving fluids through an IV tube.  Receiving an electrolyte solution through a feeding tube that is passed through your nose   and into your stomach (nasogastric tube, or NG tube).  Correcting any abnormalities in electrolytes.  Treating the underlying cause of dehydration. Follow these instructions at home:  If directed by your health care provider, drink an ORS: ? Make an ORS by following instructions on the  package. ? Start by drinking small amounts, about  cup (120 mL) every 5-10 minutes. ? Slowly increase how much you drink until you have taken the amount recommended by your health care provider.  Drink enough clear fluid to keep your urine clear or pale yellow. If you were told to drink an ORS, finish the ORS first, then start slowly drinking other clear fluids. Drink fluids such as: ? Water. Do not drink only water. Doing that can lead to having too little salt (sodium) in the body (hyponatremia). ? Ice chips. ? Fruit juice that you have added water to (diluted fruit juice). ? Low-calorie sports drinks.  Avoid: ? Alcohol. ? Drinks that contain a lot of sugar. These include high-calorie sports drinks, fruit juice that is not diluted, and soda. ? Caffeine. ? Foods that are greasy or contain a lot of fat or sugar.  Take over-the-counter and prescription medicines only as told by your health care provider.  Do not take sodium tablets. This can lead to having too much sodium in the body (hypernatremia).  Eat foods that contain a healthy balance of electrolytes, such as bananas, oranges, potatoes, tomatoes, and spinach.  Keep all follow-up visits as told by your health care provider. This is important. Contact a health care provider if:  You have abdominal pain that: ? Gets worse. ? Stays in one area (localizes).  You have a rash.  You have a stiff neck.  You are more irritable than usual.  You are sleepier or more difficult to wake up than usual.  You feel weak or dizzy.  You feel very thirsty.  You have urinated only a small amount of very dark urine over 6-8 hours. Get help right away if:  You have symptoms of severe dehydration.  You cannot drink fluids without vomiting.  Your symptoms get worse with treatment.  You have a fever.  You have a severe headache.  You have vomiting or diarrhea that: ? Gets worse. ? Does not go away.  You have blood or green matter  (bile) in your vomit.  You have blood in your stool. This may cause stool to look black and tarry.  You have not urinated in 6-8 hours.  You faint.  Your heart rate while sitting still is over 100 beats a minute.  You have trouble breathing. This information is not intended to replace advice given to you by your health care provider. Make sure you discuss any questions you have with your health care provider. Document Released: 11/17/2005 Document Revised: 06/13/2016 Document Reviewed: 01/11/2016 Elsevier Interactive Patient Education  2018 [employer]  

## 2018-04-22 NOTE — Telephone Encounter (Signed)
Received orders per Dr Lindi Adie for IV Normal Saline 1 liter, 1 run of Potassium 10 mEq, and Aloxi 0.25mg .  Notified infusion and arrived pt to infusion.

## 2018-04-22 NOTE — Assessment & Plan Note (Signed)
We will give her IV fluids along with Aloxi today

## 2018-04-22 NOTE — Progress Notes (Signed)
Received orders from Dr Lindi Adie for NS 1 liter over 1 hour and 1 run of potassium 10 mEQ to be given on tomorrow and again on Saturday.  He said he is not ordering  Aloxi for tomorrow or Saturday. The orders are in sign/held. Notified infusion RN chg Tanya and scheduling.

## 2018-04-22 NOTE — Assessment & Plan Note (Signed)
Encouraged her to take Imodium and Lomotil.

## 2018-04-22 NOTE — Assessment & Plan Note (Signed)
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, copy #16.3, axillary lymph node biopsy benign concordant, T2 N0 stage II a clinical stage AJCC 8  Treatment plan: 1.Neoadjuvant chemotherapy with TCH Perjeta x6 cycles followed by Herceptin Perjeta maintenance for 1 year 2.followed by breast conserving surgery 33fllowed by adjuvant radiation 4.Followed by adjuvant antiestrogen therapy with letrozole for 5-7 years ------------------------------------------------------------------------------------------------------------------------ Current treatment: Cycle 3 day 1TCHP Severe diarrhea nausea and vomiting: I discussed with her that we may have to further adjust her chemotherapy dosage for the next cycle.  She needs urgent IV fluids with a normal normal saline along with potassium.  I will also administer a dose of Aloxi today.  She will come back tomorrow and day after for additional IV fluids.  If she felt significantly worse and she allowed to come down to the emergency room.    I discussed with her very specifically that if she loses consciousness and she needs to come to emergency room.

## 2018-04-22 NOTE — Progress Notes (Signed)
Patient Care Team: [name], MD as PCP - General (Internal Medicine)  DIAGNOSIS:  Encounter Diagnoses  Name Primary?  . Malignant neoplasm of upper-inner quadrant of left breast in female, estrogen receptor positive ([place]) Yes  . Chemotherapy induced nausea and vomiting   . Chemotherapy induced diarrhea     SUMMARY OF ONCOLOGIC HISTORY:   Malignant neoplasm of upper-inner quadrant of left breast in female, estrogen receptor positive ([place])   02/03/2018 Initial Diagnosis    Left breast palpable lump at 1130 position 2.3 cm, at 11 o'clock position 6 mm which was benign; biopsy revealed IDC grade 3 ER 30%, PR 0%, Ki-67 30%, HER-2 positive ratio 5.62, copy #16.3, axillary lymph node biopsy benign concordant, T2 N0 stage II a clinical stage AJCC 8      02/18/2018 Breast MRI    Solitary mass in left breast upper outer quadrant 2.5 cm.  No additional enhancement in the right breast.  No adenopathy      02/26/2018 -  Neo-Adjuvant Chemotherapy    TCH Perjeta followed by Herceptin Perjeta maintenance       CHIEF COMPLIANT: Severe diarrhea, nausea and vomiting and dehydration and dizziness  INTERVAL HISTORY: [name] is a 72 year old with above-mentioned history of left breast cancer currently neoadjuvant chemotherapy.  She completed 3 cycles of [place] and she called in today complaining of profound nausea and vomiting 3 times today.  She also had severe diarrhea.  She felt dizzy and lightheaded.  We urgently saw her in the clinic she was in a wheelchair.  She was slightly tachycardic and had slightly low blood pressure.  She was awake alert and did not seem to be clinically unstable.  After scratching her we decided to take her to her infusion and give her a liter of normal saline with potassium along with Aloxi 0.25 mg IV.  She will come back tomorrow and day after for additional IV fluids.  REVIEW OF SYSTEMS:   Constitutional: Denies fevers, chills or abnormal weight  loss Eyes: Denies blurriness of vision Ears, nose, mouth, throat, and face: Denies mucositis or sore throat Respiratory: Denies cough, dyspnea or wheezes Cardiovascular: Denies palpitation, chest discomfort Gastrointestinal: Nausea vomiting and diarrhea Skin: Denies abnormal skin rashes Lymphatics: Denies new lymphadenopathy or easy bruising Neurological:Denies numbness, tingling or new weaknesses Behavioral/Psych: Mood is stable, no new changes  Extremities: No lower extremity edema  All other systems were reviewed with the patient and are negative.  I have reviewed the past medical history, past surgical history, social history and family history with the patient and they are unchanged from previous note.  ALLERGIES:  is allergic to codeine and demerol [meperidine].  MEDICATIONS:  Current Outpatient Medications  Medication Sig Dispense Refill  . amLODipine (NORVASC) 10 MG tablet Take 10 mg by mouth daily.     Marland Kitchen amoxicillin-clavulanate (AUGMENTIN) 875-125 MG tablet Take 1 tablet by mouth 2 (two) times daily. 14 tablet 0  . buPROPion (WELLBUTRIN SR) 150 MG 12 hr tablet Start one week before quit date. Take 1 tab daily x 3 days, then 1 tab BID thereafter. (Patient not taking: Reported on 03/25/2018) 60 tablet 2  . cholestyramine (QUESTRAN) 4 g packet Take 1 packet (4 g total) by mouth 3 (three) times daily. 60 each 12  . clotrimazole-betamethasone (LOTRISONE) cream Apply 1 application topically 2 (two) times daily. 30 g 0  . cyclobenzaprine (FLEXERIL) 5 MG tablet Take 1 tablet (5 mg total) by mouth 3 (three) times daily as  needed for muscle spasms. 30 tablet 0  . dexamethasone (DECADRON) 4 MG tablet Take 1 tablet (4 mg total) by mouth daily. Take 1 tablet day before chemo and take 1 tablet day after chemo 12 tablet 0  . diphenoxylate-atropine (LOMOTIL) 2.5-0.025 MG tablet Take 1 tablet by mouth 4 (four) times daily as needed for diarrhea or loose stools. 60 tablet 3  . hydrochlorothiazide  (HYDRODIURIL) 12.5 MG tablet Take 12.5 mg by mouth daily.     . hydrOXYzine (ATARAX/VISTARIL) 50 MG tablet Take 50 mg by mouth 3 (three) times daily as needed for anxiety.     Marland Kitchen ibuprofen (ADVIL,MOTRIN) 800 MG tablet Take 1 tablet (800 mg total) by mouth every 8 (eight) hours as needed. 30 tablet 0  . latanoprost (XALATAN) 0.005 % ophthalmic solution Place 1 drop into both eyes at bedtime.     . lidocaine-prilocaine (EMLA) cream Apply to affected area once 30 g 3  . LORazepam (ATIVAN) 0.5 MG tablet Take 1 tablet (0.5 mg total) by mouth at bedtime as needed for sleep. 30 tablet 0  . nicotine (NICODERM CQ - DOSED IN MG/24 HOURS) 14 mg/24hr patch Place 1 patch (14 mg total) onto the skin daily. Apply 21 mg patch daily x 6 wk, then 23m patch daily x 2 wk, then 7 mg patch daily x 2 wk (Patient not taking: Reported on 03/16/2018) 14 patch 0  . nicotine (NICODERM CQ - DOSED IN MG/24 HOURS) 21 mg/24hr patch Place 1 patch (21 mg total) onto the skin daily. Apply 21 mg patch daily x 6 wk, then 161mpatch daily x 2 wk, then 7 mg patch daily x 2 wk (Patient not taking: Reported on 03/16/2018) 14 patch 2  . nicotine (NICODERM CQ - DOSED IN MG/24 HR) 7 mg/24hr patch Place 1 patch (7 mg total) onto the skin daily. Apply 21 mg patch daily x 6 wk, then 1453match daily x 2 wk, then 7 mg patch daily x 2 wk (Patient not taking: Reported on 03/16/2018) 14 patch 0  . omeprazole (PRILOSEC) 20 MG capsule Take 20 mg by mouth 2 (two) times daily before a meal.     . ondansetron (ZOFRAN) 8 MG tablet Take 1 tablet (8 mg total) by mouth 2 (two) times daily as needed for refractory nausea / vomiting. 30 tablet 1  . oxyCODONE (OXY IR/ROXICODONE) 5 MG immediate release tablet Take 1 tablet (5 mg total) by mouth every 6 (six) hours as needed for severe pain. (Patient not taking: Reported on 04/08/2018) 10 tablet 0  . pramipexole (MIRAPEX) 0.25 MG tablet Take 0.25-1 mg by mouth at bedtime.     . prochlorperazine (COMPAZINE) 10 MG tablet  Take 1 tablet (10 mg total) by mouth every 6 (six) hours as needed (Nausea or vomiting). 30 tablet 1   Current Facility-Administered Medications  Medication Dose Route Frequency Provider Last Rate Last Dose  . potassium chloride 10 mEq in 100 mL IVPB  10 mEq Intravenous Once GudNicholas LoseD 100 mL/hr at 04/22/18 1605 10 mEq at 04/22/18 1605    PHYSICAL EXAMINATION: ECOG PERFORMANCE STATUS: 3 - Symptomatic, >50% confined to bed  Vitals:   04/22/18 1518  BP: 109/71  Pulse: (!) 110  Resp: 19  Temp: 98.6 F (37 C)  SpO2: 99%   Filed Weights   04/22/18 1518  Weight: 138 lb 11.2 oz (62.9 kg)    GENERAL:alert, no distress and comfortable SKIN: Appears dehydrated loss of skin turgor EYES: normal, Conjunctiva are  pink and non-injected, sclera clear OROPHARYNX:no exudate, no erythema and lips, buccal mucosa, and tongue normal  NECK: supple, thyroid normal size, non-tender, without nodularity LYMPH:  no palpable lymphadenopathy in the cervical, axillary or inguinal LUNGS: clear to auscultation and percussion with normal breathing effort HEART: regular rate & rhythm and no murmurs and no lower extremity edema ABDOMEN: Soft and nontender MUSCULOSKELETAL:no cyanosis of digits and no clubbing  NEURO: alert & oriented x 3 with fluent speech, no focal motor/sensory deficits EXTREMITIES: No lower extremity edema  LABORATORY DATA:  I have reviewed the data as listed CMP Latest Ref Rng & Units 04/15/2018 03/25/2018 03/16/2018  Glucose 70 - 140 mg/dL 124 104 105(H)  BUN 7 - 26 mg/dL 18 13 16   Creatinine 0.60 - 1.10 mg/dL 1.17(H) 0.84 0.98  Sodium 136 - 145 mmol/L 136 140 139  Potassium 3.5 - 5.1 mmol/L 4.4 3.7 3.6  Chloride 98 - 109 mmol/L 104 108 106  CO2 22 - 29 mmol/L 21(L) 21(L) 21(L)  Calcium 8.4 - 10.4 mg/dL 9.4 9.3 8.5(L)  Total Protein 6.4 - 8.3 g/dL 7.5 7.1 6.9  Total Bilirubin 0.2 - 1.2 mg/dL <0.2(L) <0.2(L) 0.6  Alkaline Phos 40 - 150 U/L 90 82 74  AST 5 - 34 U/L 17 14 19     ALT 0 - 55 U/L 14 11 18     Lab Results  Component Value Date   WBC 4.5 04/15/2018   HGB 10.9 (L) 04/15/2018   HCT 32.9 (L) 04/15/2018   MCV 85.8 04/15/2018   PLT 378 04/15/2018   NEUTROABS 3.3 04/15/2018    ASSESSMENT & PLAN:  Chemotherapy induced nausea and vomiting We will give her IV fluids along with Aloxi today  Chemotherapy induced diarrhea Encouraged her to take Imodium and Lomotil.  Malignant neoplasm of upper-inner quadrant of left breast in female, estrogen receptor positive ([facility]) 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, copy #16.3, axillary lymph node biopsy benign concordant, T2 N0 stage II a clinical stage AJCC 8  Treatment plan: 1.Neoadjuvant chemotherapy with TCH Perjeta x6 cycles followed by Herceptin Perjeta maintenance for 1 year 2.followed by breast conserving surgery 28fllowed by adjuvant radiation 4.Followed by adjuvant antiestrogen therapy with letrozole for 5-7 years ------------------------------------------------------------------------------------------------------------------------ Current treatment: Cycle 3 day 1TCHP Severe diarrhea nausea and vomiting: I discussed with her that we may have to further adjust her chemotherapy dosage for the next cycle.  She needs urgent IV fluids with a normal normal saline along with potassium.  I will also administer a dose of Aloxi today.  She will come back tomorrow and day after for additional IV fluids.  If she felt significantly worse and she allowed to come down to the emergency room.    I discussed with her very specifically that if she loses consciousness and she needs to come to emergency room.      No orders of the defined types were placed in this encounter.  The patient has a good understanding of the overall plan. she agrees with it. she will call  with any problems that may develop before the next visit here.   VHarriette Ohara MD 04/22/18

## 2018-04-23 ENCOUNTER — Inpatient Hospital Stay: Payer: Medicare HMO

## 2018-04-23 ENCOUNTER — Telehealth: Payer: Self-pay | Admitting: Hematology and Oncology

## 2018-04-23 VITALS — BP 118/72 | HR 101 | Temp 98.5°F | Resp 18

## 2018-04-23 DIAGNOSIS — Z5111 Encounter for antineoplastic chemotherapy: Secondary | ICD-10-CM | POA: Diagnosis not present

## 2018-04-23 DIAGNOSIS — T451X5A Adverse effect of antineoplastic and immunosuppressive drugs, initial encounter: Principal | ICD-10-CM

## 2018-04-23 DIAGNOSIS — Z170 Estrogen receptor positive status [ER+]: Secondary | ICD-10-CM

## 2018-04-23 DIAGNOSIS — K521 Toxic gastroenteritis and colitis: Secondary | ICD-10-CM

## 2018-04-23 DIAGNOSIS — C50212 Malignant neoplasm of upper-inner quadrant of left female breast: Secondary | ICD-10-CM

## 2018-04-23 DIAGNOSIS — R112 Nausea with vomiting, unspecified: Secondary | ICD-10-CM

## 2018-04-23 DIAGNOSIS — Z95828 Presence of other vascular implants and grafts: Secondary | ICD-10-CM

## 2018-04-23 MED ORDER — SODIUM CHLORIDE 0.9 % IV SOLN
Freq: Once | INTRAVENOUS | Status: AC
  Administered 2018-04-23: 15:00:00 via INTRAVENOUS

## 2018-04-23 MED ORDER — SODIUM CHLORIDE 0.9% FLUSH
10.0000 mL | Freq: Once | INTRAVENOUS | Status: AC
  Administered 2018-04-23: 10 mL
  Filled 2018-04-23: qty 10

## 2018-04-23 MED ORDER — HEPARIN SOD (PORK) LOCK FLUSH 100 UNIT/ML IV SOLN
500.0000 [IU] | Freq: Once | INTRAVENOUS | Status: AC
  Administered 2018-04-23: 500 [IU]
  Filled 2018-04-23: qty 5

## 2018-04-23 MED ORDER — POTASSIUM CHLORIDE 10 MEQ/100ML IV SOLN
10.0000 meq | Freq: Once | INTRAVENOUS | Status: AC
  Administered 2018-04-23: 10 meq via INTRAVENOUS
  Filled 2018-04-23: qty 100

## 2018-04-23 NOTE — Patient Instructions (Signed)
Dehydration, Adult Dehydration is when there is not enough fluid or water in your body. This happens when you lose more fluids than you take in. Dehydration can range from mild to very bad. It should be treated right away to keep it from getting very bad. Symptoms of mild dehydration may include:  Thirst.  Dry lips.  Slightly dry mouth.  Dry, warm skin.  Dizziness. Symptoms of moderate dehydration may include:  Very dry mouth.  Muscle cramps.  Dark pee (urine). Pee may be the color of tea.  Your body making less pee.  Your eyes making fewer tears.  Heartbeat that is uneven or faster than normal (palpitations).  Headache.  Light-headedness, especially when you stand up from sitting.  Fainting (syncope). Symptoms of very bad dehydration may include:  Changes in skin, such as: ? Cold and clammy skin. ? Blotchy (mottled) or pale skin. ? Skin that does not quickly return to normal after being lightly pinched and let go (poor skin turgor).  Changes in body fluids, such as: ? Feeling very thirsty. ? Your eyes making fewer tears. ? Not sweating when body temperature is high, such as in hot weather. ? Your body making very little pee.  Changes in vital signs, such as: ? Weak pulse. ? Pulse that is more than 100 beats a minute when you are sitting still. ? Fast breathing. ? Low blood pressure.  Other changes, such as: ? Sunken eyes. ? Cold hands and feet. ? Confusion. ? Lack of energy (lethargy). ? Trouble waking up from sleep. ? Short-term weight loss. ? Unconsciousness. Follow these instructions at home:  If told by your doctor, drink an ORS: ? Make an ORS by using instructions on the package. ? Start by drinking small amounts, about  cup (120 mL) every 5-10 minutes. ? Slowly drink more until you have had the amount that your doctor said to have.  Drink enough clear fluid to keep your pee clear or pale yellow. If you were told to drink an ORS, finish the ORS  first, then start slowly drinking clear fluids. Drink fluids such as: ? Water. Do not drink only water by itself. Doing that can make the salt (sodium) level in your body get too low (hyponatremia). ? Ice chips. ? Fruit juice that you have added water to (diluted). ? Low-calorie sports drinks.  Avoid: ? Alcohol. ? Drinks that have a lot of sugar. These include high-calorie sports drinks, fruit juice that does not have water added, and soda. ? Caffeine. ? Foods that are greasy or have a lot of fat or sugar.  Take over-the-counter and prescription medicines only as told by your doctor.  Do not take salt tablets. Doing that can make the salt level in your body get too high (hypernatremia).  Eat foods that have minerals (electrolytes). Examples include bananas, oranges, potatoes, tomatoes, and spinach.  Keep all follow-up visits as told by your doctor. This is important. Contact a doctor if:  You have belly (abdominal) pain that: ? Gets worse. ? Stays in one area (localizes).  You have a rash.  You have a stiff neck.  You get angry or annoyed more easily than normal (irritability).  You are more sleepy than normal.  You have a harder time waking up than normal.  You feel: ? Weak. ? Dizzy. ? Very thirsty.  You have peed (urinated) only a small amount of very dark pee during 6-8 hours. Get help right away if:  You have symptoms of   very bad dehydration.  You cannot drink fluids without throwing up (vomiting).  Your symptoms get worse with treatment.  You have a fever.  You have a very bad headache.  You are throwing up or having watery poop (diarrhea) and it: ? Gets worse. ? Does not go away.  You have blood or something green (bile) in your throw-up.  You have blood in your poop (stool). This may cause poop to look black and tarry.  You have not peed in 6-8 hours.  You pass out (faint).  Your heart rate when you are sitting still is more than 100 beats a  minute.  You have trouble breathing. This information is not intended to replace advice given to you by your health care provider. Make sure you discuss any questions you have with your health care provider. Document Released: 09/13/2009 Document Revised: 06/06/2016 Document Reviewed: 01/11/2016 Elsevier Interactive Patient Education  2018 [employer]  

## 2018-04-23 NOTE — Telephone Encounter (Signed)
Called pt re appts that were added per 5/23 sch msg - confirmed appt w/ pt.

## 2018-04-24 ENCOUNTER — Inpatient Hospital Stay: Payer: Medicare HMO

## 2018-04-24 VITALS — BP 111/81 | HR 93 | Temp 97.9°F | Resp 18

## 2018-04-24 DIAGNOSIS — Z5111 Encounter for antineoplastic chemotherapy: Secondary | ICD-10-CM | POA: Diagnosis not present

## 2018-04-24 DIAGNOSIS — Z95828 Presence of other vascular implants and grafts: Secondary | ICD-10-CM

## 2018-04-24 DIAGNOSIS — T451X5A Adverse effect of antineoplastic and immunosuppressive drugs, initial encounter: Principal | ICD-10-CM

## 2018-04-24 DIAGNOSIS — Z170 Estrogen receptor positive status [ER+]: Secondary | ICD-10-CM

## 2018-04-24 DIAGNOSIS — K521 Toxic gastroenteritis and colitis: Secondary | ICD-10-CM

## 2018-04-24 DIAGNOSIS — C50212 Malignant neoplasm of upper-inner quadrant of left female breast: Secondary | ICD-10-CM

## 2018-04-24 DIAGNOSIS — R112 Nausea with vomiting, unspecified: Secondary | ICD-10-CM

## 2018-04-24 MED ORDER — POTASSIUM CHLORIDE 10 MEQ/100ML IV SOLN
10.0000 meq | Freq: Once | INTRAVENOUS | Status: AC
  Administered 2018-04-24: 10 meq via INTRAVENOUS

## 2018-04-24 MED ORDER — SODIUM CHLORIDE 0.9% FLUSH
10.0000 mL | Freq: Once | INTRAVENOUS | Status: AC
  Administered 2018-04-24: 10 mL
  Filled 2018-04-24: qty 10

## 2018-04-24 MED ORDER — SODIUM CHLORIDE 0.9 % IV SOLN
Freq: Once | INTRAVENOUS | Status: AC
  Administered 2018-04-24: 10:00:00 via INTRAVENOUS

## 2018-04-24 MED ORDER — SODIUM CHLORIDE 0.9 % IV SOLN
10.0000 mg | Freq: Once | INTRAVENOUS | Status: AC
  Administered 2018-04-24: 10 mg via INTRAVENOUS

## 2018-04-24 MED ORDER — LORAZEPAM 0.5 MG PO TABS: 0.5000 mg | ORAL_TABLET | Freq: Four times a day (QID) | ORAL | 0 refills | Status: DC | PRN

## 2018-04-24 MED ORDER — HEPARIN SOD (PORK) LOCK FLUSH 100 UNIT/ML IV SOLN
500.0000 [IU] | Freq: Once | INTRAVENOUS | Status: AC
  Administered 2018-04-24: 500 [IU]
  Filled 2018-04-24: qty 5

## 2018-04-24 NOTE — Progress Notes (Signed)
Pt came for IVF and potassium infusion.  C/O nausea and vomited x 3 this am - with yellow mucus.  Stated took Zofran and Compazine with minimal relief.   On call Dr. Benay Spice notified.  Order received for Dexamethasone 10 mg IV x 1 , and Lorazepam 0.5 mg SL every 6 hours as needed for home use.  Explanations given to pt.  Pt verbalized understanding.

## 2018-04-24 NOTE — Patient Instructions (Signed)
Dehydration, Adult Dehydration is when there is not enough fluid or water in your body. This happens when you lose more fluids than you take in. Dehydration can range from mild to very bad. It should be treated right away to keep it from getting very bad. Symptoms of mild dehydration may include:  Thirst.  Dry lips.  Slightly dry mouth.  Dry, warm skin.  Dizziness. Symptoms of moderate dehydration may include:  Very dry mouth.  Muscle cramps.  Dark pee (urine). Pee may be the color of tea.  Your body making less pee.  Your eyes making fewer tears.  Heartbeat that is uneven or faster than normal (palpitations).  Headache.  Light-headedness, especially when you stand up from sitting.  Fainting (syncope). Symptoms of very bad dehydration may include:  Changes in skin, such as: ? Cold and clammy skin. ? Blotchy (mottled) or pale skin. ? Skin that does not quickly return to normal after being lightly pinched and let go (poor skin turgor).  Changes in body fluids, such as: ? Feeling very thirsty. ? Your eyes making fewer tears. ? Not sweating when body temperature is high, such as in hot weather. ? Your body making very little pee.  Changes in vital signs, such as: ? Weak pulse. ? Pulse that is more than 100 beats a minute when you are sitting still. ? Fast breathing. ? Low blood pressure.  Other changes, such as: ? Sunken eyes. ? Cold hands and feet. ? Confusion. ? Lack of energy (lethargy). ? Trouble waking up from sleep. ? Short-term weight loss. ? Unconsciousness. Follow these instructions at home:  If told by your doctor, drink an ORS: ? Make an ORS by using instructions on the package. ? Start by drinking small amounts, about  cup (120 mL) every 5-10 minutes. ? Slowly drink more until you have had the amount that your doctor said to have.  Drink enough clear fluid to keep your pee clear or pale yellow. If you were told to drink an ORS, finish the ORS  first, then start slowly drinking clear fluids. Drink fluids such as: ? Water. Do not drink only water by itself. Doing that can make the salt (sodium) level in your body get too low (hyponatremia). ? Ice chips. ? Fruit juice that you have added water to (diluted). ? Low-calorie sports drinks.  Avoid: ? Alcohol. ? Drinks that have a lot of sugar. These include high-calorie sports drinks, fruit juice that does not have water added, and soda. ? Caffeine. ? Foods that are greasy or have a lot of fat or sugar.  Take over-the-counter and prescription medicines only as told by your doctor.  Do not take salt tablets. Doing that can make the salt level in your body get too high (hypernatremia).  Eat foods that have minerals (electrolytes). Examples include bananas, oranges, potatoes, tomatoes, and spinach.  Keep all follow-up visits as told by your doctor. This is important. Contact a doctor if:  You have belly (abdominal) pain that: ? Gets worse. ? Stays in one area (localizes).  You have a rash.  You have a stiff neck.  You get angry or annoyed more easily than normal (irritability).  You are more sleepy than normal.  You have a harder time waking up than normal.  You feel: ? Weak. ? Dizzy. ? Very thirsty.  You have peed (urinated) only a small amount of very dark pee during 6-8 hours. Get help right away if:  You have symptoms of   very bad dehydration.  You cannot drink fluids without throwing up (vomiting).  Your symptoms get worse with treatment.  You have a fever.  You have a very bad headache.  You are throwing up or having watery poop (diarrhea) and it: ? Gets worse. ? Does not go away.  You have blood or something green (bile) in your throw-up.  You have blood in your poop (stool). This may cause poop to look black and tarry.  You have not peed in 6-8 hours.  You pass out (faint).  Your heart rate when you are sitting still is more than 100 beats a  minute.  You have trouble breathing. This information is not intended to replace advice given to you by your health care provider. Make sure you discuss any questions you have with your health care provider. Document Released: 09/13/2009 Document Revised: 06/06/2016 Document Reviewed: 01/11/2016 Elsevier Interactive Patient Education  2018 [employer]  

## 2018-04-29 ENCOUNTER — Other Ambulatory Visit: Payer: Self-pay | Admitting: Hematology and Oncology

## 2018-04-29 ENCOUNTER — Telehealth: Payer: Self-pay | Admitting: Hematology and Oncology

## 2018-04-29 NOTE — Telephone Encounter (Signed)
Because of severe adverse effects to chemotherapy, I recommended discontinuation of Taxotere.  She will receive Herceptin and Perjeta alone.

## 2018-05-04 ENCOUNTER — Other Ambulatory Visit: Payer: Self-pay | Admitting: Hematology and Oncology

## 2018-05-06 ENCOUNTER — Inpatient Hospital Stay (HOSPITAL_BASED_OUTPATIENT_CLINIC_OR_DEPARTMENT_OTHER): Payer: Medicare HMO | Admitting: Hematology and Oncology

## 2018-05-06 ENCOUNTER — Inpatient Hospital Stay: Payer: Medicare HMO

## 2018-05-06 ENCOUNTER — Inpatient Hospital Stay: Payer: Medicare HMO | Attending: Hematology and Oncology

## 2018-05-06 ENCOUNTER — Encounter: Payer: Self-pay | Admitting: *Deleted

## 2018-05-06 VITALS — BP 122/76

## 2018-05-06 DIAGNOSIS — Z170 Estrogen receptor positive status [ER+]: Secondary | ICD-10-CM

## 2018-05-06 DIAGNOSIS — Z5112 Encounter for antineoplastic immunotherapy: Secondary | ICD-10-CM | POA: Diagnosis not present

## 2018-05-06 DIAGNOSIS — Z79899 Other long term (current) drug therapy: Secondary | ICD-10-CM | POA: Diagnosis not present

## 2018-05-06 DIAGNOSIS — C50212 Malignant neoplasm of upper-inner quadrant of left female breast: Secondary | ICD-10-CM

## 2018-05-06 DIAGNOSIS — Z95828 Presence of other vascular implants and grafts: Secondary | ICD-10-CM

## 2018-05-06 LAB — CMP (CANCER CENTER ONLY)
ALT: <6 U/L (ref 0–55)
ANION GAP: 9 (ref 3–11)
AST: 13 U/L (ref 5–34)
Albumin: 3.5 g/dL (ref 3.5–5.0)
Alkaline Phosphatase: 92 U/L (ref 40–150)
BUN: 14 mg/dL (ref 7–26)
CHLORIDE: 108 mmol/L (ref 98–109)
CO2: 23 mmol/L (ref 22–29)
Calcium: 9.2 mg/dL (ref 8.4–10.4)
Creatinine: 0.99 mg/dL (ref 0.60–1.10)
GFR, EST NON AFRICAN AMERICAN: 56 mL/min — AB (ref >60)
GFR, Est AFR Am: >60 mL/min (ref >60)
Glucose, Bld: 92 mg/dL (ref 70–140)
POTASSIUM: 4.1 mmol/L (ref 3.5–5.1)
Sodium: 140 mmol/L (ref 136–145)
Total Bilirubin: <0.2 mg/dL — ABNORMAL LOW (ref 0.2–1.2)
Total Protein: 7 g/dL (ref 6.4–8.3)

## 2018-05-06 LAB — CBC WITH DIFFERENTIAL (CANCER CENTER ONLY)
BASOS ABS: 0 10*3/uL (ref 0.0–0.1)
Basophils Relative: 0 %
Eosinophils Absolute: 0 10*3/uL (ref 0.0–0.5)
Eosinophils Relative: 0 %
HEMATOCRIT: 29.9 % — AB (ref 34.8–46.6)
HEMOGLOBIN: 9.8 g/dL — AB (ref 11.6–15.9)
LYMPHS ABS: 2.2 10*3/uL (ref 0.9–3.3)
LYMPHS PCT: 11 %
MCH: 28.4 pg (ref 25.1–34.0)
MCHC: 32.8 g/dL (ref 31.5–36.0)
MCV: 86.7 fL (ref 79.5–101.0)
Monocytes Absolute: 1 10*3/uL — ABNORMAL HIGH (ref 0.1–0.9)
Monocytes Relative: 5 %
NEUTROS ABS: 16.1 10*3/uL — AB (ref 1.5–6.5)
Neutrophils Relative %: 84 %
Platelet Count: 395 10*3/uL (ref 145–400)
RBC: 3.45 MIL/uL — AB (ref 3.70–5.45)
RDW: 14.9 % — ABNORMAL HIGH (ref 11.2–14.5)
WBC: 19.3 10*3/uL — AB (ref 3.9–10.3)

## 2018-05-06 MED ORDER — ACETAMINOPHEN 325 MG PO TABS
650.0000 mg | ORAL_TABLET | Freq: Once | ORAL | Status: AC
  Administered 2018-05-06: 650 mg via ORAL

## 2018-05-06 MED ORDER — ACETAMINOPHEN 325 MG PO TABS
ORAL_TABLET | ORAL | Status: AC
  Filled 2018-05-06: qty 2

## 2018-05-06 MED ORDER — SODIUM CHLORIDE 0.9 % IV SOLN: Freq: Once | INTRAVENOUS | Status: DC

## 2018-05-06 MED ORDER — DEXAMETHASONE SODIUM PHOSPHATE 10 MG/ML IJ SOLN
INTRAMUSCULAR | Status: AC
  Filled 2018-05-06: qty 1

## 2018-05-06 MED ORDER — SODIUM CHLORIDE 0.9 % IV SOLN
Freq: Once | INTRAVENOUS | Status: AC
  Administered 2018-05-06: 13:00:00 via INTRAVENOUS

## 2018-05-06 MED ORDER — SODIUM CHLORIDE 0.9 % IV SOLN
420.0000 mg | Freq: Once | INTRAVENOUS | Status: AC
  Administered 2018-05-06: 420 mg via INTRAVENOUS
  Filled 2018-05-06: qty 14

## 2018-05-06 MED ORDER — SODIUM CHLORIDE 0.9% FLUSH
10.0000 mL | INTRAVENOUS | Status: DC | PRN
  Administered 2018-05-06: 10 mL
  Filled 2018-05-06: qty 10

## 2018-05-06 MED ORDER — DIPHENHYDRAMINE HCL 25 MG PO CAPS
ORAL_CAPSULE | ORAL | Status: AC
  Filled 2018-05-06: qty 2

## 2018-05-06 MED ORDER — DEXAMETHASONE SODIUM PHOSPHATE 10 MG/ML IJ SOLN
4.0000 mg | Freq: Once | INTRAMUSCULAR | Status: AC
  Administered 2018-05-06: 4 mg via INTRAVENOUS

## 2018-05-06 MED ORDER — DIPHENHYDRAMINE HCL 25 MG PO CAPS
50.0000 mg | ORAL_CAPSULE | Freq: Once | ORAL | Status: AC
  Administered 2018-05-06: 50 mg via ORAL

## 2018-05-06 MED ORDER — SODIUM CHLORIDE 0.9% FLUSH
10.0000 mL | Freq: Once | INTRAVENOUS | Status: AC
  Administered 2018-05-06: 10 mL
  Filled 2018-05-06: qty 10

## 2018-05-06 MED ORDER — HEPARIN SOD (PORK) LOCK FLUSH 100 UNIT/ML IV SOLN
500.0000 [IU] | Freq: Once | INTRAVENOUS | Status: AC | PRN
  Administered 2018-05-06: 500 [IU]
  Filled 2018-05-06: qty 5

## 2018-05-06 MED ORDER — TRASTUZUMAB CHEMO 150 MG IV SOLR
6.0000 mg/kg | Freq: Once | INTRAVENOUS | Status: AC
  Administered 2018-05-06: 399 mg via INTRAVENOUS
  Filled 2018-05-06: qty 19

## 2018-05-06 NOTE — Patient Instructions (Signed)
[facility] Discharge Instructions for Patients Receiving Chemotherapy  Today you received the following chemotherapy agents :  Herceptin, Perjeta.  To help prevent nausea and vomiting after your treatment, we encourage you to take your nausea medication as prescribed.   If you develop nausea and vomiting that is not controlled by your nausea medication, call the clinic.   BELOW ARE SYMPTOMS THAT SHOULD BE REPORTED IMMEDIATELY:  *FEVER GREATER THAN 100.5 F  *CHILLS WITH OR WITHOUT FEVER  NAUSEA AND VOMITING THAT IS NOT CONTROLLED WITH YOUR NAUSEA MEDICATION  *UNUSUAL SHORTNESS OF BREATH  *UNUSUAL BRUISING OR BLEEDING  TENDERNESS IN MOUTH AND THROAT WITH OR WITHOUT PRESENCE OF ULCERS  *URINARY PROBLEMS  *BOWEL PROBLEMS  UNUSUAL RASH Items with * indicate a potential emergency and should be followed up as soon as possible.  Feel free to call the clinic should you have any questions or concerns. The clinic phone number is [phone].  Please show the CHEMO ALERT CARD at check-in to the Emergency Department and triage nurse.   

## 2018-05-06 NOTE — Assessment & Plan Note (Signed)
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, copy #16.3, axillary lymph node biopsy benign concordant, T2 N0 stage II a clinical stage AJCC 8  Treatment plan: 1.Neoadjuvant chemotherapy with TCH Perjeta x6 cycles followed by Herceptin Perjeta maintenance for 1 year 2.followed by breast conserving surgery 35fllowed by adjuvant radiation 4.Followed by adjuvant antiestrogen therapy with letrozole for 5-7 years ------------------------------------------------------------------------------------------------------------------------ Current treatment: Cycle 4 day 1TCHP (Taxotere and carboplatin discontinued after 3 cycles)  Severe diarrhea nausea and vomiting: Resolved  Monitoring closely for toxicities especially diarrhea.  I reviewed the blood work from today. Return to clinic in 3 weeks for cycle 5

## 2018-05-06 NOTE — Progress Notes (Signed)
Patient Care Team: [name], MD as PCP - General (Internal Medicine)  DIAGNOSIS:  Encounter Diagnosis  Name Primary?  . Malignant neoplasm of upper-inner quadrant of left breast in female, estrogen receptor positive ([facility])     SUMMARY OF ONCOLOGIC HISTORY:   Malignant neoplasm of upper-inner quadrant of left breast in female, estrogen receptor positive ([place])   02/03/2018 Initial Diagnosis    Left breast palpable lump at 1130 position 2.3 cm, at 11 o'clock position 6 mm which was benign; biopsy revealed IDC grade 3 ER 30%, PR 0%, Ki-67 30%, HER-2 positive ratio 5.62, copy #16.3, axillary lymph node biopsy benign concordant, T2 N0 stage II a clinical stage AJCC 8      02/18/2018 Breast MRI    Solitary mass in left breast upper outer quadrant 2.5 cm.  No additional enhancement in the right breast.  No adenopathy      02/26/2018 -  Neo-Adjuvant Chemotherapy    TCH Perjeta followed by Herceptin Perjeta maintenance       CHIEF COMPLIANT: Cycle 4 of treatment with Herceptin and Perjeta alone  INTERVAL HISTORY: [name] is a 72 year old with above-mentioned history of left breast cancer currently neoadjuvant chemotherapy.  After 3 cycles of [place] she was extremely sick with profound nausea vomiting and dehydration.  She is here today to resume her treatment.  She did not want to receive any further chemotherapy.  She did not want to even receive a substitute type of chemotherapy as well.  REVIEW OF SYSTEMS:   Constitutional: Denies fevers, chills or abnormal weight loss Eyes: Denies blurriness of vision Ears, nose, mouth, throat, and face: Denies mucositis or sore throat Respiratory: Denies cough, dyspnea or wheezes Cardiovascular: Denies palpitation, chest discomfort Gastrointestinal:  Denies nausea, heartburn or change in bowel habits Skin: Denies abnormal skin rashes Lymphatics: Denies new lymphadenopathy or easy bruising Neurological:Denies numbness, tingling or  new weaknesses Behavioral/Psych: Mood is stable, no new changes  Extremities: No lower extremity edema Breast:  denies any pain or lumps or nodules in either breasts All other systems were reviewed with the patient and are negative.  I have reviewed the past medical history, past surgical history, social history and family history with the patient and they are unchanged from previous note.  ALLERGIES:  is allergic to codeine and demerol [meperidine].  MEDICATIONS:  Current Outpatient Medications  Medication Sig Dispense Refill  . amLODipine (NORVASC) 10 MG tablet Take 10 mg by mouth daily.     Marland Kitchen amoxicillin-clavulanate (AUGMENTIN) 875-125 MG tablet Take 1 tablet by mouth 2 (two) times daily. 14 tablet 0  . buPROPion (WELLBUTRIN SR) 150 MG 12 hr tablet Start one week before quit date. Take 1 tab daily x 3 days, then 1 tab BID thereafter. (Patient not taking: Reported on 03/25/2018) 60 tablet 2  . cholestyramine (QUESTRAN) 4 g packet Take 1 packet (4 g total) by mouth 3 (three) times daily. 60 each 12  . clotrimazole-betamethasone (LOTRISONE) cream Apply 1 application topically 2 (two) times daily. 30 g 0  . cyclobenzaprine (FLEXERIL) 5 MG tablet Take 1 tablet (5 mg total) by mouth 3 (three) times daily as needed for muscle spasms. 30 tablet 0  . dexamethasone (DECADRON) 4 MG tablet Take 1 tablet (4 mg total) by mouth daily. Take 1 tablet day before chemo and take 1 tablet day after chemo 12 tablet 0  . diphenoxylate-atropine (LOMOTIL) 2.5-0.025 MG tablet Take 1 tablet by mouth 4 (four) times daily as needed for diarrhea  or loose stools. 60 tablet 3  . hydrochlorothiazide (HYDRODIURIL) 12.5 MG tablet Take 12.5 mg by mouth daily.     . hydrOXYzine (ATARAX/VISTARIL) 50 MG tablet Take 50 mg by mouth 3 (three) times daily as needed for anxiety.     Marland Kitchen ibuprofen (ADVIL,MOTRIN) 800 MG tablet Take 1 tablet (800 mg total) by mouth every 8 (eight) hours as needed. 30 tablet 0  . latanoprost (XALATAN)  0.005 % ophthalmic solution Place 1 drop into both eyes at bedtime.     . lidocaine-prilocaine (EMLA) cream Apply to affected area once 30 g 3  . LORazepam (ATIVAN) 0.5 MG tablet Place 1 tablet (0.5 mg total) under the tongue every 6 (six) hours as needed (For nausea/vomiting). 30 tablet 0  . nicotine (NICODERM CQ - DOSED IN MG/24 HOURS) 14 mg/24hr patch Place 1 patch (14 mg total) onto the skin daily. Apply 21 mg patch daily x 6 wk, then 94m patch daily x 2 wk, then 7 mg patch daily x 2 wk (Patient not taking: Reported on 03/16/2018) 14 patch 0  . nicotine (NICODERM CQ - DOSED IN MG/24 HOURS) 21 mg/24hr patch Place 1 patch (21 mg total) onto the skin daily. Apply 21 mg patch daily x 6 wk, then 146mpatch daily x 2 wk, then 7 mg patch daily x 2 wk (Patient not taking: Reported on 03/16/2018) 14 patch 2  . nicotine (NICODERM CQ - DOSED IN MG/24 HR) 7 mg/24hr patch Place 1 patch (7 mg total) onto the skin daily. Apply 21 mg patch daily x 6 wk, then 1430match daily x 2 wk, then 7 mg patch daily x 2 wk (Patient not taking: Reported on 03/16/2018) 14 patch 0  . omeprazole (PRILOSEC) 20 MG capsule Take 20 mg by mouth 2 (two) times daily before a meal.     . ondansetron (ZOFRAN) 8 MG tablet Take 1 tablet (8 mg total) by mouth 2 (two) times daily as needed for refractory nausea / vomiting. 30 tablet 1  . pramipexole (MIRAPEX) 0.25 MG tablet Take 0.25-1 mg by mouth at bedtime.     . prochlorperazine (COMPAZINE) 10 MG tablet Take 1 tablet (10 mg total) by mouth every 6 (six) hours as needed (Nausea or vomiting). 30 tablet 1   No current facility-administered medications for this visit.    Facility-Administered Medications Ordered in Other Visits  Medication Dose Route Frequency Provider Last Rate Last Dose  . heparin lock flush 100 unit/mL  500 Units Intracatheter Once PRN GudNicholas LoseD      . pertuzumab (PERJETA) 420 mg in sodium chloride 0.9 % 250 mL chemo infusion  420 mg Intravenous Once GudNicholas LoseMD      . sodium chloride flush (NS) 0.9 % injection 10 mL  10 mL Intracatheter PRN GudNicholas LoseD      . trastuzumab (HERCEPTIN) 399 mg in sodium chloride 0.9 % 250 mL chemo infusion  6 mg/kg (Treatment Plan Recorded) Intravenous Once GudNicholas LoseD        PHYSICAL EXAMINATION: ECOG PERFORMANCE STATUS: 1 - Symptomatic but completely ambulatory  Vitals:   05/06/18 1121  BP: 127/69  Pulse: 70  Resp: 20  Temp: 98.3 F (36.8 C)  SpO2: 100%   Filed Weights   05/06/18 1121  Weight: 144 lb 8 oz (65.5 kg)    GENERAL:alert, no distress and comfortable SKIN: skin color, texture, turgor are normal, no rashes or significant lesions EYES: normal, Conjunctiva are pink and non-injected, sclera  clear OROPHARYNX:no exudate, no erythema and lips, buccal mucosa, and tongue normal  NECK: supple, thyroid normal size, non-tender, without nodularity LYMPH:  no palpable lymphadenopathy in the cervical, axillary or inguinal LUNGS: clear to auscultation and percussion with normal breathing effort HEART: regular rate & rhythm and no murmurs and no lower extremity edema ABDOMEN:abdomen soft, non-tender and normal bowel sounds MUSCULOSKELETAL:no cyanosis of digits and no clubbing  NEURO: alert & oriented x 3 with fluent speech, no focal motor/sensory deficits EXTREMITIES: No lower extremity edema   LABORATORY DATA:  I have reviewed the data as listed CMP Latest Ref Rng & Units 05/06/2018 04/15/2018 03/25/2018  Glucose 70 - 140 mg/dL 92 124 104  BUN 7 - 26 mg/dL _0 Creatinine 0.60 - 1.10 mg/dL 0.99 1.17(H) 0.84  Sodium 136 - 145 mmol/L 140 136 140  Potassium 3.5 - 5.1 mmol/L 4.1 4.4 3.7  Chloride 98 - 109 mmol/L 108 104 108  CO2 22 - 29 mmol/L 23 21(L) 21(L)  Calcium 8.4 - 10.4 mg/dL 9.2 9.4 9.3  Total Protein 6.4 - 8.3 g/dL 7.0 7.5 7.1  Total Bilirubin 0.2 - 1.2 mg/dL <0.2(L) <0.2(L) <0.2(L)  Alkaline Phos 40 - 150 U/L 92 90 82  AST 5 - 34 U/L _1 ALT 0 - 55 U/L <_2 Lab Results  Component Value Date   WBC 19.3 (H) 05/06/2018   HGB 9.8 (L) 05/06/2018   HCT 29.9 (L) 05/06/2018   MCV 86.7 05/06/2018   PLT 395 05/06/2018   NEUTROABS 16.1 (H) 05/06/2018    ASSESSMENT & PLAN:  Malignant neoplasm of upper-inner quadrant of left breast in female, estrogen receptor positive ([facility]) 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, copy #16.3, axillary lymph node biopsy benign concordant, T2 N0 stage II a clinical stage AJCC 8  Treatment plan: 1.Neoadjuvant chemotherapy with TCH Perjeta x6 cycles followed by Herceptin Perjeta maintenance for 1 year 2.followed by breast conserving surgery 30fllowed by adjuvant radiation 4.Followed by adjuvant antiestrogen therapy with letrozole for 5-7 years ------------------------------------------------------------------------------------------------------------------------ Current treatment: Cycle 4 day 1TCHP (Taxotere and carboplatin discontinued after 3 cycles)  Severe diarrhea nausea and vomiting: Resolved  Monitoring closely for toxicities especially diarrhea.  I reviewed the blood work from today. Return to clinic in 3 weeks for cycle 5     No orders of the defined types were placed in this encounter.  The patient has a good understanding of the overall plan. she agrees with it. she will call with any problems that may develop before the next visit here.   VHarriette Ohara MD 05/06/18

## 2018-05-12 ENCOUNTER — Other Ambulatory Visit: Payer: Self-pay

## 2018-05-12 DIAGNOSIS — Z170 Estrogen receptor positive status [ER+]: Secondary | ICD-10-CM

## 2018-05-12 DIAGNOSIS — M542 Cervicalgia: Secondary | ICD-10-CM

## 2018-05-12 DIAGNOSIS — R51 Headache: Secondary | ICD-10-CM

## 2018-05-12 DIAGNOSIS — R519 Headache, unspecified: Secondary | ICD-10-CM

## 2018-05-12 DIAGNOSIS — T451X5A Adverse effect of antineoplastic and immunosuppressive drugs, initial encounter: Principal | ICD-10-CM

## 2018-05-12 DIAGNOSIS — M62838 Other muscle spasm: Secondary | ICD-10-CM

## 2018-05-12 DIAGNOSIS — C50212 Malignant neoplasm of upper-inner quadrant of left female breast: Secondary | ICD-10-CM

## 2018-05-12 DIAGNOSIS — R112 Nausea with vomiting, unspecified: Secondary | ICD-10-CM

## 2018-05-12 MED ORDER — CYCLOBENZAPRINE HCL 5 MG PO TABS: 5.0000 mg | ORAL_TABLET | Freq: Three times a day (TID) | ORAL | 0 refills | Status: DC | PRN

## 2018-05-12 MED ORDER — LORAZEPAM 0.5 MG PO TABS: 0.5000 mg | ORAL_TABLET | Freq: Four times a day (QID) | ORAL | 0 refills | Status: DC | PRN

## 2018-05-12 MED ORDER — DEXAMETHASONE 4 MG PO TABS: 4.0000 mg | ORAL_TABLET | Freq: Every day | ORAL | 0 refills | Status: DC

## 2018-05-12 MED ORDER — PROCHLORPERAZINE MALEATE 10 MG PO TABS: 10.0000 mg | ORAL_TABLET | Freq: Four times a day (QID) | ORAL | 1 refills | Status: DC | PRN

## 2018-05-12 MED ORDER — ONDANSETRON HCL 8 MG PO TABS: 8.0000 mg | ORAL_TABLET | Freq: Two times a day (BID) | ORAL | 1 refills | Status: DC | PRN

## 2018-05-12 MED FILL — PROCHLORPERAZINE 10 MG TAB: 10 | 8 days supply | Qty: 30 | Fill #0

## 2018-05-12 MED FILL — CYCLOBENZAPRINE 5 MG TABLET: 5 | 10 days supply | Qty: 30 | Fill #0

## 2018-05-12 MED FILL — DEXAMETHASONE 4 MG TABLET: 4 | 12 days supply | Qty: 12 | Fill #0

## 2018-05-12 MED FILL — LORazepam 0.5 MG TABS: 0.5 | 9 days supply | Qty: 30 | Fill #0

## 2018-05-12 MED FILL — ONDANSETRON HCL 8 MG TABLET: 8 | 15 days supply | Qty: 30 | Fill #0

## 2018-05-12 NOTE — Telephone Encounter (Signed)
Pt calling to get prescription refill to be sent to [facility] for her nausea medication for chemo and flexeril for shoulder spasms. Sent refill to [facility] [facility] as requested.

## 2018-05-18 ENCOUNTER — Other Ambulatory Visit (HOSPITAL_COMMUNITY): Payer: Self-pay | Admitting: *Deleted

## 2018-05-18 DIAGNOSIS — C50212 Malignant neoplasm of upper-inner quadrant of left female breast: Secondary | ICD-10-CM

## 2018-05-19 ENCOUNTER — Encounter: Payer: Self-pay | Admitting: Hematology and Oncology

## 2018-05-19 NOTE — Progress Notes (Signed)
Mailed a $25 gas card to pt today.

## 2018-05-20 ENCOUNTER — Ambulatory Visit (HOSPITAL_COMMUNITY)
Admission: RE | Admit: 2018-05-20 | Discharge: 2018-05-20 | Disposition: A | Discharge status: Home / Self Care | Payer: Medicare HMO | Source: Ambulatory Visit | Attending: Internal Medicine | Admitting: Internal Medicine

## 2018-05-20 ENCOUNTER — Ambulatory Visit (HOSPITAL_BASED_OUTPATIENT_CLINIC_OR_DEPARTMENT_OTHER)
Admission: RE | Admit: 2018-05-20 | Discharge: 2018-05-20 | Disposition: A | Discharge status: Home / Self Care | Payer: Medicare HMO | Source: Ambulatory Visit | Attending: Cardiology | Admitting: Cardiology

## 2018-05-20 ENCOUNTER — Other Ambulatory Visit (HOSPITAL_COMMUNITY): Payer: Medicare HMO

## 2018-05-20 VITALS — BP 140/88 | HR 70 | Wt 148.0 lb

## 2018-05-20 DIAGNOSIS — C50212 Malignant neoplasm of upper-inner quadrant of left female breast: Secondary | ICD-10-CM

## 2018-05-20 DIAGNOSIS — F17200 Nicotine dependence, unspecified, uncomplicated: Secondary | ICD-10-CM | POA: Diagnosis not present

## 2018-05-20 DIAGNOSIS — F17210 Nicotine dependence, cigarettes, uncomplicated: Secondary | ICD-10-CM | POA: Diagnosis not present

## 2018-05-20 DIAGNOSIS — Z79899 Other long term (current) drug therapy: Secondary | ICD-10-CM | POA: Insufficient documentation

## 2018-05-20 DIAGNOSIS — Z800 Family history of malignant neoplasm of digestive organs: Secondary | ICD-10-CM | POA: Insufficient documentation

## 2018-05-20 DIAGNOSIS — Z803 Family history of malignant neoplasm of breast: Secondary | ICD-10-CM | POA: Diagnosis not present

## 2018-05-20 DIAGNOSIS — Z170 Estrogen receptor positive status [ER+]: Secondary | ICD-10-CM | POA: Insufficient documentation

## 2018-05-20 DIAGNOSIS — C50912 Malignant neoplasm of unspecified site of left female breast: Secondary | ICD-10-CM | POA: Diagnosis not present

## 2018-05-20 DIAGNOSIS — I10 Essential (primary) hypertension: Secondary | ICD-10-CM | POA: Insufficient documentation

## 2018-05-20 DIAGNOSIS — Z853 Personal history of malignant neoplasm of breast: Secondary | ICD-10-CM | POA: Insufficient documentation

## 2018-05-20 DIAGNOSIS — Z7952 Long term (current) use of systemic steroids: Secondary | ICD-10-CM | POA: Diagnosis not present

## 2018-05-20 DIAGNOSIS — Z809 Family history of malignant neoplasm, unspecified: Secondary | ICD-10-CM | POA: Diagnosis not present

## 2018-05-20 DIAGNOSIS — Z801 Family history of malignant neoplasm of trachea, bronchus and lung: Secondary | ICD-10-CM | POA: Diagnosis not present

## 2018-05-20 NOTE — Patient Instructions (Signed)
Your physician has requested that you have an echocardiogram. Echocardiography is a painless test that uses sound waves to create images of your heart. It provides your doctor with information about the size and shape of your heart and how well your heart's chambers and valves are working. This procedure takes approximately one hour. There are no restrictions for this procedure.   Your physician recommends that you schedule a follow-up appointment in: 3 month with Dr. Aundra Dubin and an echocardiogram

## 2018-05-20 NOTE — Progress Notes (Signed)
  Echocardiogram 2D Echocardiogram has been performed.  [name] 05/20/2018, 11:51 AM

## 2018-05-20 NOTE — Progress Notes (Signed)
Oncology: Dr. Lindi Adie  72 yo with history of HTN and smoking was referred by Dr. Lindi Adie for cardio-oncology evaluation.  Left breast cancer diagnosed in 3/19.  ER+/PR-/HER2+.  She had 3 cycles of TCH-Perjeta but could not tolerate it due to side effects.  She is now on Herceptin-Perjeta to continue for a year.    She is doing well at this point, no chest pain.  She has some shortness of breath making up her bed but no dyspnea walking on flat ground.   ECG (3/19, personally reviewed): NSR, normal  PMH: 1. HTN 2. Active smoker 3. Breast cancer: Left breast cancer diagnosed in 3/19.  ER+/PR-/HER2+.  She had 3 cycles of TCH-Perjeta but could not tolerate it due to side effects.  She is now on Herceptin-Perjeta to continue for a year.   - Echo (3/19): EF 60-65%, mild LVH. - Echo (6/19): EF 55-60%, GLS -19%, normal RV size and systolic function.   Social History   Socioeconomic History  . Marital status: Married    Spouse name: Not on file  . Number of children: Not on file  . Years of education: Not on file  . Highest education level: Not on file  Occupational History  . Not on file  Social Needs  . Financial resource strain: Not on file  . Food insecurity:    Worry: Not on file    Inability: Not on file  . Transportation needs:    Medical: Not on file    Non-medical: Not on file  Tobacco Use  . Smoking status: Current Every Day Smoker    Packs/day: 1.00    Years: 47.00    Pack years: 47.00    Types: Cigarettes  . Smokeless tobacco: Never Used  Substance and Sexual Activity  . Alcohol use: No  . Drug use: No  . Sexual activity: Not on file  Lifestyle  . Physical activity:    Days per week: Not on file    Minutes per session: Not on file  . Stress: Not on file  Relationships  . Social connections:    Talks on phone: Not on file    Gets together: Not on file    Attends religious service: Not on file    Active member of club or organization: Not on file    Attends  meetings of clubs or organizations: Not on file    Relationship status: Not on file  . Intimate partner violence:    Fear of current or ex partner: Not on file    Emotionally abused: Not on file    Physically abused: Not on file    Forced sexual activity: Not on file  Other Topics Concern  . Not on file  Social History Narrative  . Not on file   Family History  Problem Relation Age of Onset  . Liver cancer Mother        deceased 67  . Lung cancer Father 6       deceased 21; smoker  . Stomach cancer Maternal Grandmother        deceased 36  . Cancer Paternal Uncle        deceased 54; unk. type  . Breast cancer Maternal Aunt        dx 55s; deceased 21s  . Breast cancer Maternal Aunt        dx 8s; deceased 77s  . Breast cancer Cousin        daughter of mat aunt with breast ca  ROS: All systems reviewed and negative except as per HPI.   Current Outpatient Medications  Medication Sig Dispense Refill  . amLODipine (NORVASC) 10 MG tablet Take 10 mg by mouth daily.     Marland Kitchen amoxicillin-clavulanate (AUGMENTIN) 875-125 MG tablet Take 1 tablet by mouth 2 (two) times daily. 14 tablet 0  . buPROPion (WELLBUTRIN SR) 150 MG 12 hr tablet Start one week before quit date. Take 1 tab daily x 3 days, then 1 tab BID thereafter. 60 tablet 2  . cholestyramine (QUESTRAN) 4 g packet Take 1 packet (4 g total) by mouth 3 (three) times daily. 60 each 12  . clotrimazole-betamethasone (LOTRISONE) cream Apply 1 application topically 2 (two) times daily. 30 g 0  . cyclobenzaprine (FLEXERIL) 5 MG tablet Take 1 tablet (5 mg total) by mouth 3 (three) times daily as needed for muscle spasms. 30 tablet 0  . dexamethasone (DECADRON) 4 MG tablet Take 1 tablet (4 mg total) by mouth daily. Take 1 tablet day before chemo and take 1 tablet day after chemo 12 tablet 0  . diphenoxylate-atropine (LOMOTIL) 2.5-0.025 MG tablet Take 1 tablet by mouth 4 (four) times daily as needed for diarrhea or loose stools. 60 tablet 3  .  hydrochlorothiazide (HYDRODIURIL) 12.5 MG tablet Take 12.5 mg by mouth daily.     . hydrOXYzine (ATARAX/VISTARIL) 50 MG tablet Take 50 mg by mouth 3 (three) times daily as needed for anxiety.     Marland Kitchen ibuprofen (ADVIL,MOTRIN) 800 MG tablet Take 1 tablet (800 mg total) by mouth every 8 (eight) hours as needed. 30 tablet 0  . latanoprost (XALATAN) 0.005 % ophthalmic solution Place 1 drop into both eyes at bedtime.     . lidocaine-prilocaine (EMLA) cream Apply to affected area once 30 g 3  . LORazepam (ATIVAN) 0.5 MG tablet Place 1 tablet (0.5 mg total) under the tongue every 6 (six) hours as needed (For nausea/vomiting). 30 tablet 0  . omeprazole (PRILOSEC) 20 MG capsule Take 20 mg by mouth 2 (two) times daily before a meal.     . ondansetron (ZOFRAN) 8 MG tablet Take 1 tablet (8 mg total) by mouth 2 (two) times daily as needed for refractory nausea / vomiting. 30 tablet 1  . pramipexole (MIRAPEX) 0.25 MG tablet Take 0.25-1 mg by mouth at bedtime.     . prochlorperazine (COMPAZINE) 10 MG tablet Take 1 tablet (10 mg total) by mouth every 6 (six) hours as needed (Nausea or vomiting). 30 tablet 1  . nicotine (NICODERM CQ - DOSED IN MG/24 HOURS) 14 mg/24hr patch Place 1 patch (14 mg total) onto the skin daily. Apply 21 mg patch daily x 6 wk, then 48m patch daily x 2 wk, then 7 mg patch daily x 2 wk (Patient not taking: Reported on 03/16/2018) 14 patch 0  . nicotine (NICODERM CQ - DOSED IN MG/24 HOURS) 21 mg/24hr patch Place 1 patch (21 mg total) onto the skin daily. Apply 21 mg patch daily x 6 wk, then 164mpatch daily x 2 wk, then 7 mg patch daily x 2 wk (Patient not taking: Reported on 03/16/2018) 14 patch 2  . nicotine (NICODERM CQ - DOSED IN MG/24 HR) 7 mg/24hr patch Place 1 patch (7 mg total) onto the skin daily. Apply 21 mg patch daily x 6 wk, then 1428match daily x 2 wk, then 7 mg patch daily x 2 wk (Patient not taking: Reported on 03/16/2018) 14 patch 0   No current facility-administered medications for  this encounter.    General: NAD Neck: No JVD, no thyromegaly or thyroid nodule.  Lungs: Clear to auscultation bilaterally with normal respiratory effort. CV: Nondisplaced PMI.  Heart regular S1/S2, no S3/S4, no murmur.  No peripheral edema.  No carotid bruit.  Normal pedal pulses.  Abdomen: Soft, nontender, no hepatosplenomegaly, no distention.  Skin: Intact without lesions or rashes.  Neurologic: Alert and oriented x 3.  Psych: Normal affect. Extremities: No clubbing or cyanosis.  HEENT: Normal.   Assessment/Plan:  1. Smoking: I strongly encouraged her to quit.  She is going to try nicotine patches. 2. HTN: BP borderline elevated today.  Will monitor, continue current meds.  3. Breast cancer: She is now getting a Herceptin-containing regimen.  We discussed the cardiac risk with Herceptin and the rationale behind echo screening.  I reviewed the 3/19 echo and today's echo: Normal LV systolic function and strain pattern.  - Repeat echo in 3 months with office followup.   Loralie Champagne 05/20/2018

## 2018-05-26 ENCOUNTER — Other Ambulatory Visit: Payer: Self-pay | Admitting: Adult Health

## 2018-05-26 ENCOUNTER — Other Ambulatory Visit: Payer: Self-pay | Admitting: Hematology and Oncology

## 2018-05-26 NOTE — Progress Notes (Signed)
Patient does not need Neulasta.  No need to get authorization.

## 2018-05-27 ENCOUNTER — Other Ambulatory Visit: Payer: Self-pay | Admitting: Hematology and Oncology

## 2018-05-27 ENCOUNTER — Inpatient Hospital Stay: Payer: Medicare HMO

## 2018-05-27 ENCOUNTER — Telehealth: Payer: Self-pay | Admitting: Hematology and Oncology

## 2018-05-27 ENCOUNTER — Inpatient Hospital Stay (HOSPITAL_BASED_OUTPATIENT_CLINIC_OR_DEPARTMENT_OTHER): Payer: Medicare HMO | Admitting: Hematology and Oncology

## 2018-05-27 DIAGNOSIS — C50212 Malignant neoplasm of upper-inner quadrant of left female breast: Secondary | ICD-10-CM

## 2018-05-27 DIAGNOSIS — Z5112 Encounter for antineoplastic immunotherapy: Secondary | ICD-10-CM | POA: Diagnosis not present

## 2018-05-27 DIAGNOSIS — Z170 Estrogen receptor positive status [ER+]: Secondary | ICD-10-CM

## 2018-05-27 DIAGNOSIS — Z79899 Other long term (current) drug therapy: Secondary | ICD-10-CM

## 2018-05-27 DIAGNOSIS — Z95828 Presence of other vascular implants and grafts: Secondary | ICD-10-CM

## 2018-05-27 LAB — CMP (CANCER CENTER ONLY)
ALBUMIN: 4.1 g/dL (ref 3.5–5.0)
ALT: 16 U/L (ref 0–44)
ANION GAP: 10 (ref 5–15)
AST: 15 U/L (ref 15–41)
Alkaline Phosphatase: 81 U/L (ref 38–126)
BUN: 18 mg/dL (ref 8–23)
CALCIUM: 9.5 mg/dL (ref 8.9–10.3)
CO2: 24 mmol/L (ref 22–32)
Chloride: 105 mmol/L (ref 98–111)
Creatinine: 1.02 mg/dL — ABNORMAL HIGH (ref 0.44–1.00)
GFR, Est AFR Am: >60 mL/min (ref >60)
GFR, Estimated: 54 mL/min — ABNORMAL LOW (ref >60)
GLUCOSE: 111 mg/dL — AB (ref 70–99)
Potassium: 4.1 mmol/L (ref 3.5–5.1)
SODIUM: 139 mmol/L (ref 135–145)
Total Bilirubin: 0.4 mg/dL (ref 0.3–1.2)
Total Protein: 7.4 g/dL (ref 6.5–8.1)

## 2018-05-27 LAB — CBC WITH DIFFERENTIAL (CANCER CENTER ONLY)
BASOS ABS: 0 10*3/uL (ref 0.0–0.1)
BASOS PCT: 0 %
EOS ABS: 0 10*3/uL (ref 0.0–0.5)
Eosinophils Relative: 0 %
HCT: 32.9 % — ABNORMAL LOW (ref 34.8–46.6)
Hemoglobin: 10.8 g/dL — ABNORMAL LOW (ref 11.6–15.9)
Lymphocytes Relative: 11 %
Lymphs Abs: 1.5 10*3/uL (ref 0.9–3.3)
MCH: 29.3 pg (ref 25.1–34.0)
MCHC: 32.9 g/dL (ref 31.5–36.0)
MCV: 89 fL (ref 79.5–101.0)
MONOS PCT: 7 %
Monocytes Absolute: 0.9 10*3/uL (ref 0.1–0.9)
NEUTROS ABS: 10.6 10*3/uL — AB (ref 1.5–6.5)
NEUTROS PCT: 82 %
Platelet Count: 355 10*3/uL (ref 145–400)
RBC: 3.7 MIL/uL (ref 3.70–5.45)
RDW: 15.8 % — ABNORMAL HIGH (ref 11.2–14.5)
WBC: 13 10*3/uL — AB (ref 3.9–10.3)

## 2018-05-27 MED ORDER — SODIUM CHLORIDE 0.9% FLUSH
10.0000 mL | Freq: Once | INTRAVENOUS | Status: AC
  Administered 2018-05-27: 10 mL
  Filled 2018-05-27: qty 10

## 2018-05-27 MED ORDER — HEPARIN SOD (PORK) LOCK FLUSH 100 UNIT/ML IV SOLN
500.0000 [IU] | Freq: Once | INTRAVENOUS | Status: AC | PRN
  Administered 2018-05-27: 500 [IU]
  Filled 2018-05-27: qty 5

## 2018-05-27 MED ORDER — DEXAMETHASONE SODIUM PHOSPHATE 10 MG/ML IJ SOLN: 4.0000 mg | Freq: Once | INTRAMUSCULAR | Status: DC

## 2018-05-27 MED ORDER — SODIUM CHLORIDE 0.9 % IV SOLN
Freq: Once | INTRAVENOUS | Status: AC
  Administered 2018-05-27: 11:00:00 via INTRAVENOUS

## 2018-05-27 MED ORDER — TRASTUZUMAB CHEMO 150 MG IV SOLR
6.0000 mg/kg | Freq: Once | INTRAVENOUS | Status: AC
  Administered 2018-05-27: 399 mg via INTRAVENOUS
  Filled 2018-05-27: qty 19

## 2018-05-27 MED ORDER — ACETAMINOPHEN 325 MG PO TABS
650.0000 mg | ORAL_TABLET | Freq: Once | ORAL | Status: AC
  Administered 2018-05-27: 650 mg via ORAL

## 2018-05-27 MED ORDER — SODIUM CHLORIDE 0.9% FLUSH
10.0000 mL | INTRAVENOUS | Status: DC | PRN
  Administered 2018-05-27: 10 mL
  Filled 2018-05-27: qty 10

## 2018-05-27 MED ORDER — DIPHENHYDRAMINE HCL 25 MG PO CAPS
50.0000 mg | ORAL_CAPSULE | Freq: Once | ORAL | Status: AC
  Administered 2018-05-27: 50 mg via ORAL

## 2018-05-27 MED ORDER — SODIUM CHLORIDE 0.9 % IV SOLN
420.0000 mg | Freq: Once | INTRAVENOUS | Status: AC
  Administered 2018-05-27: 420 mg via INTRAVENOUS
  Filled 2018-05-27: qty 14

## 2018-05-27 NOTE — Telephone Encounter (Signed)
Gave avs and calendar ° °

## 2018-05-27 NOTE — Assessment & Plan Note (Signed)
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, copy #16.3, axillary lymph node biopsy benign concordant, T2 N0 stage II a clinical stage AJCC 8  Treatment plan: 1.Neoadjuvant chemotherapy with TCH Perjeta x6 cycles followed by Herceptin Perjeta maintenance for 1 year 2.followed by breast conserving surgery 39fllowed by adjuvant radiation 4.Followed by adjuvant antiestrogen therapy with letrozole for 5-7 years ------------------------------------------------------------------------------------------------------------------------ Current treatment: TCHP x3 cycles (Taxotere and carboplatin discontinued after 3 cycles), now on Herceptin Perjeta maintenance  Severe diarrhea nausea and vomiting: Resolved  Monitoring closely for toxicities especially diarrhea.  I reviewed the blood work from today. Return to clinic in 3 weeks for Herceptin and Perjeta in every 6 weeks for follow-up with me with labs

## 2018-05-27 NOTE — Progress Notes (Signed)
Patient Care Team: [name], MD as PCP - General (Internal Medicine)  DIAGNOSIS:  Encounter Diagnosis  Name Primary?  . Malignant neoplasm of upper-inner quadrant of left breast in female, estrogen receptor positive ([place])     SUMMARY OF ONCOLOGIC HISTORY:   Malignant neoplasm of upper-inner quadrant of left breast in female, estrogen receptor positive ([place])   02/03/2018 Initial Diagnosis    Left breast palpable lump at 1130 position 2.3 cm, at 11 o'clock position 6 mm which was benign; biopsy revealed IDC grade 3 ER 30%, PR 0%, Ki-67 30%, HER-2 positive ratio 5.62, copy #16.3, axillary lymph node biopsy benign concordant, T2 N0 stage II a clinical stage AJCC 8      02/18/2018 Breast MRI    Solitary mass in left breast upper outer quadrant 2.5 cm.  No additional enhancement in the right breast.  No adenopathy      02/26/2018 -  Neo-Adjuvant Chemotherapy    TCH Perjeta followed by Herceptin Perjeta maintenance       CHIEF COMPLIANT: Herceptin Perjeta cycle 5  INTERVAL HISTORY: [name] is a 72 year old with above-mentioned history ofleft breast cancer currently on neoadjuvant chemotherapy.  Today she receives her fifth cycle of Herceptin and Perjeta.  She could not tolerate Taxotere and carboplatin and chemo has been discontinued.  She appears to be tolerating the Herceptin Perjeta significantly better.  After she completes her 6 cycles of this regimen then she will proceed with the breast MRI and surgery.  She will continue then with the Herceptin Perjeta maintenance.  REVIEW OF SYSTEMS:   Constitutional: Denies fevers, chills or abnormal weight loss Eyes: Denies blurriness of vision Ears, nose, mouth, throat, and face: Denies mucositis or sore throat Respiratory: Denies cough, dyspnea or wheezes Cardiovascular: Denies palpitation, chest discomfort Gastrointestinal:  Denies nausea, heartburn or change in bowel habits Skin: Denies abnormal skin rashes Lymphatics:  Denies new lymphadenopathy or easy bruising Neurological:Denies numbness, tingling or new weaknesses Behavioral/Psych: Mood is stable, no new changes  Extremities: No lower extremity edema  All other systems were reviewed with the patient and are negative.  I have reviewed the past medical history, past surgical history, social history and family history with the patient and they are unchanged from previous note.  ALLERGIES:  is allergic to codeine and demerol [meperidine].  MEDICATIONS:  Current Outpatient Medications  Medication Sig Dispense Refill  . amLODipine (NORVASC) 10 MG tablet Take 10 mg by mouth daily.     Marland Kitchen amoxicillin-clavulanate (AUGMENTIN) 875-125 MG tablet Take 1 tablet by mouth 2 (two) times daily. 14 tablet 0  . buPROPion (WELLBUTRIN SR) 150 MG 12 hr tablet Start one week before quit date. Take 1 tab daily x 3 days, then 1 tab BID thereafter. 60 tablet 2  . cholestyramine (QUESTRAN) 4 g packet Take 1 packet (4 g total) by mouth 3 (three) times daily. 60 each 12  . clotrimazole-betamethasone (LOTRISONE) cream Apply 1 application topically 2 (two) times daily. 30 g 0  . cyclobenzaprine (FLEXERIL) 5 MG tablet Take 1 tablet (5 mg total) by mouth 3 (three) times daily as needed for muscle spasms. 30 tablet 0  . dexamethasone (DECADRON) 4 MG tablet Take 1 tablet (4 mg total) by mouth daily. Take 1 tablet day before chemo and take 1 tablet day after chemo 12 tablet 0  . diphenoxylate-atropine (LOMOTIL) 2.5-0.025 MG tablet Take 1 tablet by mouth 4 (four) times daily as needed for diarrhea or loose stools. 60 tablet 3  .  hydrochlorothiazide (HYDRODIURIL) 12.5 MG tablet Take 12.5 mg by mouth daily.     . hydrOXYzine (ATARAX/VISTARIL) 50 MG tablet Take 50 mg by mouth 3 (three) times daily as needed for anxiety.     Marland Kitchen ibuprofen (ADVIL,MOTRIN) 800 MG tablet Take 1 tablet (800 mg total) by mouth every 8 (eight) hours as needed. 30 tablet 0  . latanoprost (XALATAN) 0.005 % ophthalmic  solution Place 1 drop into both eyes at bedtime.     . lidocaine-prilocaine (EMLA) cream Apply to affected area once 30 g 3  . LORazepam (ATIVAN) 0.5 MG tablet Place 1 tablet (0.5 mg total) under the tongue every 6 (six) hours as needed (For nausea/vomiting). 30 tablet 0  . nicotine (NICODERM CQ - DOSED IN MG/24 HOURS) 14 mg/24hr patch Place 1 patch (14 mg total) onto the skin daily. Apply 21 mg patch daily x 6 wk, then 38m patch daily x 2 wk, then 7 mg patch daily x 2 wk (Patient not taking: Reported on 03/16/2018) 14 patch 0  . nicotine (NICODERM CQ - DOSED IN MG/24 HOURS) 21 mg/24hr patch Place 1 patch (21 mg total) onto the skin daily. Apply 21 mg patch daily x 6 wk, then 139mpatch daily x 2 wk, then 7 mg patch daily x 2 wk (Patient not taking: Reported on 03/16/2018) 14 patch 2  . nicotine (NICODERM CQ - DOSED IN MG/24 HR) 7 mg/24hr patch Place 1 patch (7 mg total) onto the skin daily. Apply 21 mg patch daily x 6 wk, then 1447match daily x 2 wk, then 7 mg patch daily x 2 wk (Patient not taking: Reported on 03/16/2018) 14 patch 0  . omeprazole (PRILOSEC) 20 MG capsule Take 20 mg by mouth 2 (two) times daily before a meal.     . ondansetron (ZOFRAN) 8 MG tablet Take 1 tablet (8 mg total) by mouth 2 (two) times daily as needed for refractory nausea / vomiting. 30 tablet 1  . pramipexole (MIRAPEX) 0.25 MG tablet Take 0.25-1 mg by mouth at bedtime.     . prochlorperazine (COMPAZINE) 10 MG tablet Take 1 tablet (10 mg total) by mouth every 6 (six) hours as needed (Nausea or vomiting). 30 tablet 1   No current facility-administered medications for this visit.     PHYSICAL EXAMINATION: ECOG PERFORMANCE STATUS: 1 - Symptomatic but completely ambulatory  There were no vitals filed for this visit. There were no vitals filed for this visit.  GENERAL:alert, no distress and comfortable SKIN: skin color, texture, turgor are normal, no rashes or significant lesions EYES: normal, Conjunctiva are pink and  non-injected, sclera clear OROPHARYNX:no exudate, no erythema and lips, buccal mucosa, and tongue normal  NECK: supple, thyroid normal size, non-tender, without nodularity LYMPH:  no palpable lymphadenopathy in the cervical, axillary or inguinal LUNGS: clear to auscultation and percussion with normal breathing effort HEART: regular rate & rhythm and no murmurs and no lower extremity edema ABDOMEN:abdomen soft, non-tender and normal bowel sounds MUSCULOSKELETAL:no cyanosis of digits and no clubbing  NEURO: alert & oriented x 3 with fluent speech, no focal motor/sensory deficits EXTREMITIES: No lower extremity edema  LABORATORY DATA:  I have reviewed the data as listed CMP Latest Ref Rng & Units 05/06/2018 04/15/2018 03/25/2018  Glucose 70 - 140 mg/dL 92 124 104  BUN 7 - 26 mg/dL 14 18 13   Creatinine 0.60 - 1.10 mg/dL 0.99 1.17(H) 0.84  Sodium 136 - 145 mmol/L 140 136 140  Potassium 3.5 - 5.1 mmol/L 4.1  4.4 3.7  Chloride 98 - 109 mmol/L 108 104 108  CO2 22 - 29 mmol/L 23 21(L) 21(L)  Calcium 8.4 - 10.4 mg/dL 9.2 9.4 9.3  Total Protein 6.4 - 8.3 g/dL 7.0 7.5 7.1  Total Bilirubin 0.2 - 1.2 mg/dL <0.2(L) <0.2(L) <0.2(L)  Alkaline Phos 40 - 150 U/L 92 90 82  AST 5 - 34 U/L 13 17 14   ALT 0 - 55 U/L <6 14 11     Lab Results  Component Value Date   WBC 13.0 (H) 05/27/2018   HGB 10.8 (L) 05/27/2018   HCT 32.9 (L) 05/27/2018   MCV 89.0 05/27/2018   PLT 355 05/27/2018   NEUTROABS 10.6 (H) 05/27/2018    ASSESSMENT & PLAN:  Malignant neoplasm of upper-inner quadrant of left breast in female, estrogen receptor positive ([facility]) 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, copy #16.3, axillary lymph node biopsy benign concordant, T2 N0 stage II a clinical stage AJCC 8  Treatment plan: 1.Neoadjuvant chemotherapy with TCH Perjeta x6 cycles followed by  Herceptin Perjeta maintenance for 1 year 2.followed by breast conserving surgery 11fllowed by adjuvant radiation 4.Followed by adjuvant antiestrogen therapy with letrozole for 5-7 years ------------------------------------------------------------------------------------------------------------------------ Current treatment: TCHP x3 cycles (Taxotere and carboplatin discontinued after 3 cycles), now on Herceptin Perjeta Cycle 5  Severe diarrhea nausea and vomiting: Resolved Chemotherapy-induced anemia: Monitoring  Monitoring closely for toxicities especially diarrhea.  I reviewed the blood work from today. Return to clinic in 3 weeks for Herceptin and Perjeta in every 6 weeks for follow-up with me with labs I schedule her for a breast MRI after the July 18 infusion. I informed Dr. CBrantley Stageto see her after the MRI. We will present her in the tumor board on 06/23/2018.    No orders of the defined types were placed in this encounter.  The patient has a good understanding of the overall plan. she agrees with it. she will call with any problems that may develop before the next visit here.   VHarriette Ohara MD 05/27/18

## 2018-05-27 NOTE — Patient Instructions (Signed)
[facility] Discharge Instructions for Patients Receiving Chemotherapy  Today you received the following chemotherapy agents Herceptin and Perjeta.   To help prevent nausea and vomiting after your treatment, we encourage you to take your nausea medication as directed.   If you develop nausea and vomiting that is not controlled by your nausea medication, call the clinic.   BELOW ARE SYMPTOMS THAT SHOULD BE REPORTED IMMEDIATELY:  *FEVER GREATER THAN 100.5 F  *CHILLS WITH OR WITHOUT FEVER  NAUSEA AND VOMITING THAT IS NOT CONTROLLED WITH YOUR NAUSEA MEDICATION  *UNUSUAL SHORTNESS OF BREATH  *UNUSUAL BRUISING OR BLEEDING  TENDERNESS IN MOUTH AND THROAT WITH OR WITHOUT PRESENCE OF ULCERS  *URINARY PROBLEMS  *BOWEL PROBLEMS  UNUSUAL RASH Items with * indicate a potential emergency and should be followed up as soon as possible.  Feel free to call the clinic should you have any questions or concerns. The clinic phone number is [phone].  Please show the CHEMO ALERT CARD at check-in to the Emergency Department and triage nurse.   

## 2018-06-11 ENCOUNTER — Other Ambulatory Visit: Payer: Self-pay | Admitting: Hematology and Oncology

## 2018-06-11 DIAGNOSIS — R51 Headache: Secondary | ICD-10-CM

## 2018-06-11 DIAGNOSIS — R112 Nausea with vomiting, unspecified: Secondary | ICD-10-CM

## 2018-06-11 DIAGNOSIS — R519 Headache, unspecified: Secondary | ICD-10-CM

## 2018-06-11 DIAGNOSIS — M62838 Other muscle spasm: Secondary | ICD-10-CM

## 2018-06-11 DIAGNOSIS — M542 Cervicalgia: Secondary | ICD-10-CM

## 2018-06-11 DIAGNOSIS — T451X5A Adverse effect of antineoplastic and immunosuppressive drugs, initial encounter: Principal | ICD-10-CM

## 2018-06-11 MED FILL — ONDANSETRON HCL 8 MG TABLET: 8 | 15 days supply | Qty: 30 | Fill #1

## 2018-06-11 MED FILL — PROCHLORPERAZINE 10 MG TAB: 10 | 8 days supply | Qty: 30 | Fill #1

## 2018-06-11 MED FILL — CYCLOBENZAPRINE HCL 5 MG TA: 5 | 10 days supply | Qty: 30 | Fill #0

## 2018-06-14 ENCOUNTER — Other Ambulatory Visit: Payer: Self-pay | Admitting: Hematology and Oncology

## 2018-06-14 DIAGNOSIS — T451X5A Adverse effect of antineoplastic and immunosuppressive drugs, initial encounter: Principal | ICD-10-CM

## 2018-06-14 DIAGNOSIS — R112 Nausea with vomiting, unspecified: Secondary | ICD-10-CM

## 2018-06-14 MED FILL — LORazepam 0.5 MG TABS: 0.5 | 9 days supply | Qty: 30 | Fill #0

## 2018-06-17 ENCOUNTER — Inpatient Hospital Stay: Payer: Medicare HMO

## 2018-06-17 ENCOUNTER — Inpatient Hospital Stay: Payer: Medicare HMO | Attending: Hematology and Oncology

## 2018-06-17 ENCOUNTER — Telehealth: Payer: Self-pay | Admitting: Adult Health

## 2018-06-17 ENCOUNTER — Inpatient Hospital Stay (HOSPITAL_BASED_OUTPATIENT_CLINIC_OR_DEPARTMENT_OTHER): Payer: Medicare HMO | Admitting: Adult Health

## 2018-06-17 ENCOUNTER — Ambulatory Visit: Payer: Medicare HMO

## 2018-06-17 ENCOUNTER — Encounter: Payer: Self-pay | Admitting: Adult Health

## 2018-06-17 VITALS — BP 134/77 | HR 85 | Temp 97.7°F | Resp 18 | Ht 63.5 in | Wt 144.4 lb

## 2018-06-17 DIAGNOSIS — K219 Gastro-esophageal reflux disease without esophagitis: Secondary | ICD-10-CM | POA: Insufficient documentation

## 2018-06-17 DIAGNOSIS — Z170 Estrogen receptor positive status [ER+]: Principal | ICD-10-CM

## 2018-06-17 DIAGNOSIS — C50212 Malignant neoplasm of upper-inner quadrant of left female breast: Secondary | ICD-10-CM

## 2018-06-17 DIAGNOSIS — Z800 Family history of malignant neoplasm of digestive organs: Secondary | ICD-10-CM | POA: Insufficient documentation

## 2018-06-17 DIAGNOSIS — Z801 Family history of malignant neoplasm of trachea, bronchus and lung: Secondary | ICD-10-CM | POA: Diagnosis not present

## 2018-06-17 DIAGNOSIS — F419 Anxiety disorder, unspecified: Secondary | ICD-10-CM | POA: Insufficient documentation

## 2018-06-17 DIAGNOSIS — M129 Arthropathy, unspecified: Secondary | ICD-10-CM

## 2018-06-17 DIAGNOSIS — Z5112 Encounter for antineoplastic immunotherapy: Secondary | ICD-10-CM

## 2018-06-17 DIAGNOSIS — F17210 Nicotine dependence, cigarettes, uncomplicated: Secondary | ICD-10-CM | POA: Diagnosis not present

## 2018-06-17 DIAGNOSIS — G2581 Restless legs syndrome: Secondary | ICD-10-CM

## 2018-06-17 DIAGNOSIS — I10 Essential (primary) hypertension: Secondary | ICD-10-CM | POA: Diagnosis not present

## 2018-06-17 DIAGNOSIS — Z803 Family history of malignant neoplasm of breast: Secondary | ICD-10-CM | POA: Diagnosis not present

## 2018-06-17 DIAGNOSIS — Z95828 Presence of other vascular implants and grafts: Secondary | ICD-10-CM

## 2018-06-17 LAB — CBC WITH DIFFERENTIAL (CANCER CENTER ONLY)
BASOS ABS: 0.1 10*3/uL (ref 0.0–0.1)
Basophils Relative: 1 %
EOS PCT: 0 %
Eosinophils Absolute: 0 10*3/uL (ref 0.0–0.5)
HEMATOCRIT: 34.6 % — AB (ref 34.8–46.6)
Hemoglobin: 11.5 g/dL — ABNORMAL LOW (ref 11.6–15.9)
LYMPHS PCT: 12 %
Lymphs Abs: 1.7 10*3/uL (ref 0.9–3.3)
MCH: 29.3 pg (ref 25.1–34.0)
MCHC: 33.4 g/dL (ref 31.5–36.0)
MCV: 87.7 fL (ref 79.5–101.0)
Monocytes Absolute: 0.9 10*3/uL (ref 0.1–0.9)
Monocytes Relative: 6 %
NEUTROS ABS: 12.2 10*3/uL — AB (ref 1.5–6.5)
Neutrophils Relative %: 81 %
PLATELETS: 385 10*3/uL (ref 145–400)
RBC: 3.94 MIL/uL (ref 3.70–5.45)
RDW: 14.6 % — ABNORMAL HIGH (ref 11.2–14.5)
WBC: 14.9 10*3/uL — AB (ref 3.9–10.3)

## 2018-06-17 LAB — CMP (CANCER CENTER ONLY)
ALK PHOS: 90 U/L (ref 38–126)
ALT: 20 U/L (ref 0–44)
AST: 18 U/L (ref 15–41)
Albumin: 4.2 g/dL (ref 3.5–5.0)
Anion gap: 8 (ref 5–15)
BUN: 23 mg/dL (ref 8–23)
CALCIUM: 9.9 mg/dL (ref 8.9–10.3)
CO2: 23 mmol/L (ref 22–32)
CREATININE: 1.16 mg/dL — AB (ref 0.44–1.00)
Chloride: 104 mmol/L (ref 98–111)
GFR, Est AFR Am: 54 mL/min — ABNORMAL LOW (ref >60)
GFR, Estimated: 47 mL/min — ABNORMAL LOW (ref >60)
GLUCOSE: 96 mg/dL (ref 70–99)
Potassium: 4.5 mmol/L (ref 3.5–5.1)
SODIUM: 135 mmol/L (ref 135–145)
Total Bilirubin: 0.3 mg/dL (ref 0.3–1.2)
Total Protein: 7.7 g/dL (ref 6.5–8.1)

## 2018-06-17 MED ORDER — ACETAMINOPHEN 325 MG PO TABS
650.0000 mg | ORAL_TABLET | Freq: Once | ORAL | Status: AC
  Administered 2018-06-17: 650 mg via ORAL

## 2018-06-17 MED ORDER — DIPHENHYDRAMINE HCL 25 MG PO CAPS
50.0000 mg | ORAL_CAPSULE | Freq: Once | ORAL | Status: AC
  Administered 2018-06-17: 50 mg via ORAL

## 2018-06-17 MED ORDER — DIPHENHYDRAMINE HCL 25 MG PO CAPS
ORAL_CAPSULE | ORAL | Status: AC
  Filled 2018-06-17: qty 2

## 2018-06-17 MED ORDER — DIAZEPAM 5 MG PO TABS: 5.0000 mg | ORAL_TABLET | Freq: Four times a day (QID) | ORAL | 0 refills | Status: DC | PRN

## 2018-06-17 MED ORDER — HEPARIN SOD (PORK) LOCK FLUSH 100 UNIT/ML IV SOLN
500.0000 [IU] | Freq: Once | INTRAVENOUS | Status: AC | PRN
  Administered 2018-06-17: 500 [IU]
  Filled 2018-06-17: qty 5

## 2018-06-17 MED ORDER — SODIUM CHLORIDE 0.9% FLUSH
10.0000 mL | Freq: Once | INTRAVENOUS | Status: AC
  Administered 2018-06-17: 10 mL
  Filled 2018-06-17: qty 10

## 2018-06-17 MED ORDER — PERTUZUMAB CHEMO INJECTION 420 MG/14ML
420.0000 mg | Freq: Once | INTRAVENOUS | Status: AC
  Administered 2018-06-17: 420 mg via INTRAVENOUS
  Filled 2018-06-17: qty 14

## 2018-06-17 MED ORDER — ACETAMINOPHEN 325 MG PO TABS
ORAL_TABLET | ORAL | Status: AC
  Filled 2018-06-17: qty 2

## 2018-06-17 MED ORDER — SODIUM CHLORIDE 0.9 % IV SOLN
Freq: Once | INTRAVENOUS | Status: AC
  Administered 2018-06-17: 12:00:00 via INTRAVENOUS

## 2018-06-17 MED ORDER — TRASTUZUMAB CHEMO 150 MG IV SOLR
6.0000 mg/kg | Freq: Once | INTRAVENOUS | Status: AC
  Administered 2018-06-17: 399 mg via INTRAVENOUS
  Filled 2018-06-17: qty 19

## 2018-06-17 MED ORDER — SODIUM CHLORIDE 0.9% FLUSH
10.0000 mL | INTRAVENOUS | Status: DC | PRN
  Administered 2018-06-17: 10 mL
  Filled 2018-06-17: qty 10

## 2018-06-17 NOTE — Patient Instructions (Signed)
[facility] Discharge Instructions for Patients Receiving Chemotherapy  Today you received the following chemotherapy agents Herceptin and Perjeta.   To help prevent nausea and vomiting after your treatment, we encourage you to take your nausea medication as directed.   If you develop nausea and vomiting that is not controlled by your nausea medication, call the clinic.   BELOW ARE SYMPTOMS THAT SHOULD BE REPORTED IMMEDIATELY:  *FEVER GREATER THAN 100.5 F  *CHILLS WITH OR WITHOUT FEVER  NAUSEA AND VOMITING THAT IS NOT CONTROLLED WITH YOUR NAUSEA MEDICATION  *UNUSUAL SHORTNESS OF BREATH  *UNUSUAL BRUISING OR BLEEDING  TENDERNESS IN MOUTH AND THROAT WITH OR WITHOUT PRESENCE OF ULCERS  *URINARY PROBLEMS  *BOWEL PROBLEMS  UNUSUAL RASH Items with * indicate a potential emergency and should be followed up as soon as possible.  Feel free to call the clinic should you have any questions or concerns. The clinic phone number is [phone].  Please show the CHEMO ALERT CARD at check-in to the Emergency Department and triage nurse.   

## 2018-06-17 NOTE — Telephone Encounter (Signed)
Per 7/18 no los

## 2018-06-17 NOTE — Assessment & Plan Note (Addendum)
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, copy #16.3, axillary lymph node biopsy benign concordant, T2 N0 stage II a clinical stage AJCC 8  Treatment plan: 1.Neoadjuvant chemotherapy with TCH Perjeta x6 cycles followed by Herceptin Perjeta maintenance for 1 year 2.followed by breast conserving surgery 46fllowed by adjuvant radiation 4.Followed by adjuvant antiestrogen therapy with letrozole for 5-7 years ------------------------------------------------------------------------------------------------------------------------ Current treatment: TCHP x3 cycles (Taxotere and carboplatin discontinued after 3 cycles), now on Herceptin Perjeta maintenance  BSanayais doing well today. She will proceed with treatment.  She has her f/u MRI tomorrow for surgical planning.  I gave her some valium for her to take prior to her MRI.  She knows that she has to have a driver to her MRI and I cautioned her on it making her sleepy.  BCaldoniasays previously she has tolerated a 161mValium tablet the past.    BrAnnalayaill return in August for f/u with Dr. GuLindi AdieShe does have f/u after her MRI with Dr. CoBrantley Stage

## 2018-06-17 NOTE — Progress Notes (Signed)
Patient declined 30 minute post observation after Perjeta.  Patient discharged with no distress.

## 2018-06-17 NOTE — Progress Notes (Signed)
[facility] Follow up:    [name], MD [place] [address]   DIAGNOSIS: Cancer Staging Malignant neoplasm of upper-inner quadrant of left breast in female, estrogen receptor positive ([place]) Staging form: Breast, AJCC 8th Edition - Clinical stage from 02/10/2018: Stage IIA (cT2, cN0, cM0, G3, ER: Positive, PR: Negative, HER2: Positive) - Unsigned Staging comments: Staged at breast conference on 3.13.19   SUMMARY OF ONCOLOGIC HISTORY:   Malignant neoplasm of upper-inner quadrant of left breast in female, estrogen receptor positive ([name])   02/03/2018 Initial Diagnosis    Left breast palpable lump at 1130 position 2.3 cm, at 11 o'clock position 6 mm which was benign; biopsy revealed IDC grade 3 ER 30%, PR 0%, Ki-67 30%, HER-2 positive ratio 5.62, copy #16.3, axillary lymph node biopsy benign concordant, T2 N0 stage II a clinical stage AJCC 8      02/18/2018 Breast MRI    Solitary mass in left breast upper outer quadrant 2.5 cm.  No additional enhancement in the right breast.  No adenopathy      02/26/2018 -  Neo-Adjuvant Chemotherapy    TCH Perjeta followed by Herceptin Perjeta maintenance       CURRENT THERAPY: neoadjuvant HP  INTERVAL HISTORY: [name] 72 y.o. female returns for evaluation prior to her last neoadjuvant herceptin/Perjeta.  She is doing well today and is nervous about her MRI tomorrow.  She notes that she needs anti anxiety medication.  She tells me that the Lorazepam doesn't work for her.  She notes that valium does.  She says that with her last MRI it was very challenging due to her anxiety.     Patient Active Problem List   Diagnosis Date Noted  . Chemotherapy induced nausea and vomiting 04/22/2018  . Chemotherapy induced diarrhea 04/22/2018  . Port-A-Cath in place 02/26/2018  . Genetic testing 02/19/2018  . Family history of breast cancer   . Malignant neoplasm of upper-inner quadrant of left breast in female,  estrogen receptor positive ([name]) 02/09/2018    is allergic to codeine and demerol [meperidine].  MEDICAL HISTORY: Past Medical History:  Diagnosis Date  . Anxiety   . Arthritis   . Breast cancer ([name])   . Cough   . Family history of breast cancer   . Genetic testing 02/19/2018   Multi-Cancer panel (83 genes) @ [facility] - No pathogenic mutations detected  . GERD (gastroesophageal reflux disease)   . Hypertension   . RLS (restless legs syndrome)     SURGICAL HISTORY: Past Surgical History:  Procedure Laterality Date  . ABDOMINAL HYSTERECTOMY     1970's due to ectopic pregnancy  . BREAST BIOPSY Left 2016  . BREAST SURGERY     cyst removal  . CARPOMETACARPEL SUSPENSION PLASTY Right 10/30/2016   Procedure: SUSPENSION PLASTY abductor pollicis longus transfer excision trapezium right;  Surgeon: Daryll Brod, MD;  Location: [facility];  Service: Orthopedics;  Laterality: Right;  axillary block  SUSPENSION PLASTY abductor pollicis longus transfer excision trapezium right  . KIDNEY DONATION Left   . PORTACATH PLACEMENT Right 02/24/2018   Procedure: INSERTION OF RIGHT INTERNAL JUGULAR PORT-A-CATH WITH ULTRA SOUND GUIDANCE ERAS PATHWAY;  Surgeon: Erroll Luna, MD;  Location: [facility];  Service: General;  Laterality: Right;  . WRIST SURGERY     cyst removal    SOCIAL HISTORY: Social History   Socioeconomic History  . Marital status: Married    Spouse name: Not on file  . Number  of children: Not on file  . Years of education: Not on file  . Highest education level: Not on file  Occupational History  . Not on file  Social Needs  . Financial resource strain: Not on file  . Food insecurity:    Worry: Not on file    Inability: Not on file  . Transportation needs:    Medical: Not on file    Non-medical: Not on file  Tobacco Use  . Smoking status: Current Every Day Smoker    Packs/day: 1.00    Years: 47.00    Pack years: 47.00    Types: Cigarettes  . Smokeless  tobacco: Never Used  Substance and Sexual Activity  . Alcohol use: No  . Drug use: No  . Sexual activity: Not on file  Lifestyle  . Physical activity:    Days per week: Not on file    Minutes per session: Not on file  . Stress: Not on file  Relationships  . Social connections:    Talks on phone: Not on file    Gets together: Not on file    Attends religious service: Not on file    Active member of club or organization: Not on file    Attends meetings of clubs or organizations: Not on file    Relationship status: Not on file  . Intimate partner violence:    Fear of current or ex partner: Not on file    Emotionally abused: Not on file    Physically abused: Not on file    Forced sexual activity: Not on file  Other Topics Concern  . Not on file  Social History Narrative  . Not on file    FAMILY HISTORY: Family History  Problem Relation Age of Onset  . Liver cancer Mother        deceased 7  . Lung cancer Father 75       deceased 26; smoker  . Stomach cancer Maternal Grandmother        deceased 23  . Cancer Paternal Uncle        deceased 33; unk. type  . Breast cancer Maternal Aunt        dx 39s; deceased 16s  . Breast cancer Maternal Aunt        dx 66s; deceased 41s  . Breast cancer Cousin        daughter of mat aunt with breast ca    Review of Systems  Constitutional: Negative for appetite change, chills, fatigue and fever.  HENT:   Negative for hearing loss.   Eyes: Negative for eye problems and icterus.  Respiratory: Negative for chest tightness, cough and shortness of breath.   Cardiovascular: Negative for chest pain, leg swelling and palpitations.  Gastrointestinal: Negative for abdominal distention, abdominal pain, constipation, diarrhea, nausea and vomiting.  Endocrine: Negative for hot flashes.  Skin: Negative for itching and rash.  Neurological: Negative for dizziness, extremity weakness, headaches and numbness.  Hematological: Negative for adenopathy.   Psychiatric/Behavioral: Negative for depression. The patient is not nervous/anxious.       PHYSICAL EXAMINATION  ECOG PERFORMANCE STATUS: 1 - Symptomatic but completely ambulatory  Vitals:   06/17/18 1124  BP: 134/77  Pulse: 85  Resp: 18  Temp: 97.7 F (36.5 C)  SpO2: 100%    Physical Exam  Constitutional: She is oriented to person, place, and time. She appears well-developed and well-nourished.  HENT:  Head: Normocephalic and atraumatic.  Mouth/Throat: Oropharynx is clear and moist.  No oropharyngeal exudate.  Eyes: Pupils are equal, round, and reactive to light. No scleral icterus.  Neck: Neck supple.  Cardiovascular: Normal rate, regular rhythm and normal heart sounds.  Pulmonary/Chest: Effort normal and breath sounds normal.  Abdominal: Soft. Bowel sounds are normal. She exhibits no distension and no mass. There is no tenderness. There is no guarding.  Musculoskeletal: She exhibits no edema.  Lymphadenopathy:    She has no cervical adenopathy.  Neurological: She is alert and oriented to person, place, and time.  Skin: Skin is warm and dry. Capillary refill takes less than 2 seconds. No rash noted.  Psychiatric: She has a normal mood and affect.    LABORATORY DATA:  CBC    Component Value Date/Time   WBC 14.9 (H) 06/17/2018 1040   WBC 11.8 (H) 02/22/2018 1208   RBC 3.94 06/17/2018 1040   HGB 11.5 (L) 06/17/2018 1040   HCT 34.6 (L) 06/17/2018 1040   PLT 385 06/17/2018 1040   MCV 87.7 06/17/2018 1040   MCH 29.3 06/17/2018 1040   MCHC 33.4 06/17/2018 1040   RDW 14.6 (H) 06/17/2018 1040   LYMPHSABS 1.7 06/17/2018 1040   MONOABS 0.9 06/17/2018 1040   EOSABS 0.0 06/17/2018 1040   BASOSABS 0.1 06/17/2018 1040    CMP     Component Value Date/Time   NA 135 06/17/2018 1040   K 4.5 06/17/2018 1040   CL 104 06/17/2018 1040   CO2 23 06/17/2018 1040   GLUCOSE 96 06/17/2018 1040   BUN 23 06/17/2018 1040   CREATININE 1.16 (H) 06/17/2018 1040   CALCIUM 9.9  06/17/2018 1040   PROT 7.7 06/17/2018 1040   ALBUMIN 4.2 06/17/2018 1040   AST 18 06/17/2018 1040   ALT 20 06/17/2018 1040   ALKPHOS 90 06/17/2018 1040   BILITOT 0.3 06/17/2018 1040   GFRNONAA 47 (L) 06/17/2018 1040   GFRAA 54 (L) 06/17/2018 1040    ASSESSMENT and PLAN:   Malignant neoplasm of upper-inner quadrant of left breast in female, estrogen receptor positive ([facility]) 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, copy #16.3, axillary lymph node biopsy benign concordant, T2 N0 stage II a clinical stage AJCC 8  Treatment plan: 1.Neoadjuvant chemotherapy with TCH Perjeta x6 cycles followed by Herceptin Perjeta maintenance for 1 year 2.followed by breast conserving surgery 82fllowed by adjuvant radiation 4.Followed by adjuvant antiestrogen therapy with letrozole for 5-7 years ------------------------------------------------------------------------------------------------------------------------ Current treatment: TCHP x3 cycles (Taxotere and carboplatin discontinued after 3 cycles), now on Herceptin Perjeta maintenance  BDacotais doing well today. She will proceed with treatment.  She has her f/u MRI tomorrow for surgical planning.  I gave her some valium for her to take prior to her MRI.  She knows that she has to have a driver to her MRI and I cautioned her on it making her sleepy.  BArabelasays previously she has tolerated a 127mValium tablet the past.    BrErikill return in August for f/u with Dr. GuLindi AdieShe does have f/u after her MRI with Dr. CoBrantley Stage     All questions were answered. The patient knows to call the clinic with any problems, questions or concerns. We can certainly see the patient much sooner if necessary.  A total of (30) minutes of face-to-face time was spent with this patient with greater than 50% of that time  in counseling and care-coordination.  This note was electronically signed. Scot Dock, NP 06/17/2018

## 2018-06-18 ENCOUNTER — Ambulatory Visit (HOSPITAL_COMMUNITY)
Admission: RE | Admit: 2018-06-18 | Discharge: 2018-06-18 | Disposition: A | Discharge status: Home / Self Care | Payer: Medicare HMO | Source: Ambulatory Visit | Attending: Hematology and Oncology | Admitting: Hematology and Oncology

## 2018-06-18 ENCOUNTER — Encounter (HOSPITAL_COMMUNITY): Payer: Self-pay

## 2018-06-18 ENCOUNTER — Other Ambulatory Visit: Payer: Self-pay | Admitting: Hematology and Oncology

## 2018-06-18 ENCOUNTER — Ambulatory Visit: Payer: Self-pay | Admitting: Surgery

## 2018-06-18 ENCOUNTER — Other Ambulatory Visit: Payer: Self-pay | Admitting: Surgery

## 2018-06-18 DIAGNOSIS — C50212 Malignant neoplasm of upper-inner quadrant of left female breast: Secondary | ICD-10-CM

## 2018-06-18 DIAGNOSIS — C50912 Malignant neoplasm of unspecified site of left female breast: Secondary | ICD-10-CM

## 2018-06-18 DIAGNOSIS — Z170 Estrogen receptor positive status [ER+]: Secondary | ICD-10-CM | POA: Insufficient documentation

## 2018-06-18 MED ORDER — GADOBENATE DIMEGLUMINE 529 MG/ML IV SOLN: 15.0000 mL | Freq: Once | INTRAVENOUS | Status: DC | PRN

## 2018-06-18 MED ORDER — GADOBENATE DIMEGLUMINE 529 MG/ML IV SOLN
15.0000 mL | Freq: Once | INTRAVENOUS | Status: AC | PRN
  Administered 2018-06-18: 13 mL via INTRAVENOUS

## 2018-06-18 NOTE — H&P (Signed)
ELEASE SWARM Documented: 06/18/2018 10:27 AM Location: [facility] Patient #: [id number] DOB: 1946/11/22 Undefined / Language: [name] / Race: White Female  History of Present Illness [name] A. 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 She had her MRI today. The report is not out but I have reviewed her films and there appears to be a complete response. The patient states she can no longer feel a mass in her left breast. The area is sore after treatment. She did have issues tolerating chemotherapy and has completed most of her regimen. She has 2 more treatments in August. She is interested in breast conserving surgery.  The patient is a 72 year old female.   Allergies Sabino Gasser; 06/18/2018 10:27 AM) Codeine/Codeine Derivatives  Medication History Sabino Gasser; 06/18/2018 10:32 AM) AmLODIPine Besylate (10MG Tablet, Oral) Active. Amoxicillin-Pot Clavulanate (875-125MG Tablet, Oral) Active. BuPROPion HCl ER (SR) (150MG Tablet ER 12HR, Oral) Active. Clotrimazole-Betamethasone (1-0.05% Cream, External) Active. Dexamethasone (4MG Tablet, Oral) Active. Diphenoxylate-Atropine (2.5-0.025MG Tablet, Oral) Active. HydrOXYzine HCl (50MG Tablet, Oral) Active. Latanoprost (0.005% Solution, Ophthalmic) Active. Lidocaine-Prilocaine (2.5-2.5% Cream, External) Active. LORazepam (0.5MG Tablet, Oral) Active. Omeprazole (20MG Capsule DR, Oral) Active. Ondansetron HCl (8MG Tablet, Oral) Active. Prochlorperazine Maleate (10MG Tablet, Oral) Active. Pramipexole Dihydrochloride (0.25MG Tablet, Oral) Active. HydroCHLOROthiazide (12.5MG Capsule, Oral) Active. TraZODone HCl (100MG Tablet, Oral) Active. Norvasc (5MG Tablet, Oral) Active. Zantac (150MG Tablet, Oral) Active. Medications Reconciled    Vitals Sabino Gasser;  06/18/2018 10:32 AM) 06/18/2018 10:32 AM Weight: 145.38 lb Height: 63in Body Surface Area: 1.69 m Body Mass Index: 25.75 kg/m  Temp.: 19F(Oral)  Pulse: 88 (Regular)  BP: 132/82 (Sitting, Left Arm, Standard)      Physical Exam (Jory Welke A. Hektor Huston MD; 06/18/2018 10:56 AM)  General Mental Status-Alert. General Appearance-Consistent with stated age. Hydration-Well hydrated. Voice-Normal.  Head and Neck Head-normocephalic, atraumatic with no lesions or palpable masses. Trachea-midline. Thyroid Gland Characteristics - normal size and consistency.  Chest and Lung Exam Chest and lung exam reveals -quiet, even and easy respiratory effort with no use of accessory muscles and on auscultation, normal breath sounds, no adventitious sounds and normal vocal resonance. Inspection Chest Wall - Normal. Back - normal.  Breast Note: Left breast reveals no evidence of mass lesion. No evidence of left axillary adenopathy. Right breast is normal.  Cardiovascular Cardiovascular examination reveals -normal heart sounds, regular rate and rhythm with no murmurs and normal pedal pulses bilaterally.  Neurologic Neurologic evaluation reveals -alert and oriented x 3 with no impairment of recent or remote memory. Mental Status-Normal.  Musculoskeletal Normal Exam - Left-Upper Extremity Strength Normal and Lower Extremity Strength Normal. Normal Exam - Right-Upper Extremity Strength Normal and Lower Extremity Strength Normal.  Lymphatic Head & Neck  General Head & Neck Lymphatics: Bilateral - Description - Normal. Axillary  General Axillary Region: Bilateral - Description - Normal. Tenderness - Non Tender.    Assessment & Plan (Man Bonneau A. Alverda Nazzaro MD; 06/18/2018 10:57 AM)  BREAST CANCER, LEFT (C50.912) Impression: Stage II  Complete response on MRI and exam to chemotherapy.  She would like to proceed with breast conserving surgery. Risk of lumpectomy  include bleeding, infection, seroma, more surgery, use of seed/wire, wound care, cosmetic deformity and the need for other treatments, death , blood clots, death. Pt agrees to proceed. Risk of sentinel lymph node mapping include bleeding, infection, lymphedema, shoulder pain. stiffness, dye allergy. cosmetic deformity , blood  clots, death, need for more surgery. Pt agres to proceed.  Current Plans You are being scheduled for surgery- Our schedulers will call you.  You should hear from our office's scheduling department within 5 working days about the location, date, and time of surgery. We try to make accommodations for patient's preferences in scheduling surgery, but sometimes the OR schedule or the surgeon's schedule prevents [place] from making those accommodations.  If you have not heard from our office [phone]) in 5 working days, call the office and ask for your surgeon's nurse.  If you have other questions about your diagnosis, plan, or surgery, call the office and ask for your surgeon's nurse.  Pt Education - [facility] Breast Cancer Information Given - [facility] "Breast Journey" Package We discussed the staging and pathophysiology of breast cancer. We discussed all of the different options for treatment for breast cancer including surgery, chemotherapy, radiation therapy, Herceptin, and antiestrogen therapy. We discussed a sentinel lymph node biopsy as she does not appear to having lymph node involvement right now. We discussed the performance of that with injection of radioactive tracer and blue dye. We discussed that she would have an incision underneath her axillary hairline. We discussed that there is a bout a 10-20% chance of having a positive node with a sentinel lymph node biopsy and we will await the permanent pathology to make any other first further decisions in terms of her treatment. One of these options might be to return to the operating room to perform an axillary lymph node  dissection. We discussed about a 1-2% risk lifetime of chronic shoulder pain as well as lymphedema associated with a sentinel lymph node biopsy. We discussed the options for treatment of the breast cancer which included lumpectomy versus a mastectomy. We discussed the performance of the lumpectomy with a wire placement. We discussed a 10-20% chance of a positive margin requiring reexcision in the operating room. We also discussed that she may need radiation therapy or antiestrogen therapy or both if she undergoes lumpectomy. We discussed the mastectomy and the postoperative care for that as well. We discussed that there is no difference in her survival whether she undergoes lumpectomy with radiation therapy or antiestrogen therapy versus a mastectomy. There is a slight difference in the local recurrence rate being 3-5% with lumpectomy and about 1% with a mastectomy. We discussed the risks of operation including bleeding, infection, possible reoperation. She understands her further therapy will be based on what her stages at the time of her operation.  Pt Education - [facility] breast cancer surgery: discussed with patient and provided information. Pt Education - [facility] Breast Biopsy HCI: discussed with patient and provided information. Pt Education - ABC (After Breast Cancer) Class Info: discussed with patient and provided information. Pt Education - [facility] Breast Pains Education

## 2018-06-21 ENCOUNTER — Telehealth: Payer: Self-pay | Admitting: *Deleted

## 2018-06-21 DIAGNOSIS — Z170 Estrogen receptor positive status [ER+]: Principal | ICD-10-CM

## 2018-06-21 DIAGNOSIS — C50212 Malignant neoplasm of upper-inner quadrant of left female breast: Secondary | ICD-10-CM

## 2018-06-21 NOTE — Telephone Encounter (Signed)
Called pt to assess needs and to discuss next steps after sx. Confirmed future appts for herceptin/perjeta. Informed pt that orders have been placed for pt to see Dr. Isidore Moos and Dr. Lindi Adie after sx.  Denied further needs or questions at this time.

## 2018-06-22 ENCOUNTER — Telehealth: Payer: Self-pay | Admitting: Hematology and Oncology

## 2018-06-22 NOTE — Telephone Encounter (Signed)
Mailed patient calendar of upcoming sept appts per 7/22 sch message

## 2018-06-24 ENCOUNTER — Encounter: Payer: Self-pay | Admitting: Radiation Oncology

## 2018-07-08 ENCOUNTER — Inpatient Hospital Stay: Payer: Medicare HMO

## 2018-07-08 ENCOUNTER — Inpatient Hospital Stay (HOSPITAL_BASED_OUTPATIENT_CLINIC_OR_DEPARTMENT_OTHER): Payer: Medicare HMO | Admitting: Hematology and Oncology

## 2018-07-08 ENCOUNTER — Inpatient Hospital Stay: Payer: Medicare HMO | Attending: Hematology and Oncology

## 2018-07-08 ENCOUNTER — Other Ambulatory Visit: Payer: Self-pay | Admitting: Hematology and Oncology

## 2018-07-08 VITALS — BP 153/77

## 2018-07-08 DIAGNOSIS — Z170 Estrogen receptor positive status [ER+]: Secondary | ICD-10-CM

## 2018-07-08 DIAGNOSIS — Z95828 Presence of other vascular implants and grafts: Secondary | ICD-10-CM

## 2018-07-08 DIAGNOSIS — Z9221 Personal history of antineoplastic chemotherapy: Secondary | ICD-10-CM | POA: Diagnosis not present

## 2018-07-08 DIAGNOSIS — Z5112 Encounter for antineoplastic immunotherapy: Secondary | ICD-10-CM | POA: Insufficient documentation

## 2018-07-08 DIAGNOSIS — Z79899 Other long term (current) drug therapy: Secondary | ICD-10-CM

## 2018-07-08 DIAGNOSIS — F17210 Nicotine dependence, cigarettes, uncomplicated: Secondary | ICD-10-CM | POA: Diagnosis not present

## 2018-07-08 DIAGNOSIS — C50212 Malignant neoplasm of upper-inner quadrant of left female breast: Secondary | ICD-10-CM | POA: Insufficient documentation

## 2018-07-08 LAB — CBC WITH DIFFERENTIAL (CANCER CENTER ONLY)
Basophils Absolute: 0 10*3/uL (ref 0.0–0.1)
Basophils Relative: 0 %
EOS ABS: 0 10*3/uL (ref 0.0–0.5)
Eosinophils Relative: 0 %
HCT: 32.9 % — ABNORMAL LOW (ref 34.8–46.6)
HEMOGLOBIN: 10.8 g/dL — AB (ref 11.6–15.9)
LYMPHS ABS: 1.5 10*3/uL (ref 0.9–3.3)
Lymphocytes Relative: 13 %
MCH: 28.4 pg (ref 25.1–34.0)
MCHC: 32.8 g/dL (ref 31.5–36.0)
MCV: 86.6 fL (ref 79.5–101.0)
Monocytes Absolute: 0.9 10*3/uL (ref 0.1–0.9)
Monocytes Relative: 8 %
NEUTROS PCT: 79 %
Neutro Abs: 9.2 10*3/uL — ABNORMAL HIGH (ref 1.5–6.5)
Platelet Count: 377 10*3/uL (ref 145–400)
RBC: 3.8 MIL/uL (ref 3.70–5.45)
RDW: 14.7 % — ABNORMAL HIGH (ref 11.2–14.5)
WBC Count: 11.6 10*3/uL — ABNORMAL HIGH (ref 3.9–10.3)

## 2018-07-08 LAB — CMP (CANCER CENTER ONLY)
ALT: 11 U/L (ref 0–44)
AST: 13 U/L — AB (ref 15–41)
Albumin: 3.8 g/dL (ref 3.5–5.0)
Alkaline Phosphatase: 80 U/L (ref 38–126)
Anion gap: 12 (ref 5–15)
BUN: 18 mg/dL (ref 8–23)
CHLORIDE: 110 mmol/L (ref 98–111)
CO2: 18 mmol/L — ABNORMAL LOW (ref 22–32)
CREATININE: 0.94 mg/dL (ref 0.44–1.00)
Calcium: 9.3 mg/dL (ref 8.9–10.3)
GFR, Estimated: >60 mL/min (ref >60)
Glucose, Bld: 107 mg/dL — ABNORMAL HIGH (ref 70–99)
Potassium: 4.2 mmol/L (ref 3.5–5.1)
SODIUM: 140 mmol/L (ref 135–145)
Total Bilirubin: 0.3 mg/dL (ref 0.3–1.2)
Total Protein: 7.3 g/dL (ref 6.5–8.1)

## 2018-07-08 MED ORDER — DIPHENHYDRAMINE HCL 25 MG PO CAPS
ORAL_CAPSULE | ORAL | Status: AC
  Filled 2018-07-08: qty 2

## 2018-07-08 MED ORDER — SODIUM CHLORIDE 0.9 % IV SOLN
Freq: Once | INTRAVENOUS | Status: AC
  Administered 2018-07-08: 10:00:00 via INTRAVENOUS
  Filled 2018-07-08: qty 250

## 2018-07-08 MED ORDER — SODIUM CHLORIDE 0.9 % IV SOLN
420.0000 mg | Freq: Once | INTRAVENOUS | Status: AC
  Administered 2018-07-08: 420 mg via INTRAVENOUS
  Filled 2018-07-08: qty 14

## 2018-07-08 MED ORDER — SODIUM CHLORIDE 0.9% FLUSH
10.0000 mL | Freq: Once | INTRAVENOUS | Status: AC
  Administered 2018-07-08: 10 mL
  Filled 2018-07-08: qty 10

## 2018-07-08 MED ORDER — HEPARIN SOD (PORK) LOCK FLUSH 100 UNIT/ML IV SOLN
500.0000 [IU] | Freq: Once | INTRAVENOUS | Status: DC | PRN
  Filled 2018-07-08: qty 5

## 2018-07-08 MED ORDER — ACETAMINOPHEN 325 MG PO TABS
650.0000 mg | ORAL_TABLET | Freq: Once | ORAL | Status: AC
  Administered 2018-07-08: 650 mg via ORAL

## 2018-07-08 MED ORDER — SODIUM CHLORIDE 0.9% FLUSH
10.0000 mL | INTRAVENOUS | Status: DC | PRN
  Filled 2018-07-08: qty 10

## 2018-07-08 MED ORDER — DIPHENHYDRAMINE HCL 25 MG PO CAPS
50.0000 mg | ORAL_CAPSULE | Freq: Once | ORAL | Status: AC
  Administered 2018-07-08: 50 mg via ORAL

## 2018-07-08 MED ORDER — ACETAMINOPHEN 325 MG PO TABS
ORAL_TABLET | ORAL | Status: AC
  Filled 2018-07-08: qty 2

## 2018-07-08 MED ORDER — TRASTUZUMAB CHEMO 150 MG IV SOLR
6.0000 mg/kg | Freq: Once | INTRAVENOUS | Status: AC
  Administered 2018-07-08: 399 mg via INTRAVENOUS
  Filled 2018-07-08: qty 19

## 2018-07-08 NOTE — Patient Instructions (Signed)
[facility] Discharge Instructions for Patients Receiving Chemotherapy  Today you received the following chemotherapy agents Herceptin and Perjeta.   To help prevent nausea and vomiting after your treatment, we encourage you to take your nausea medication as directed.   If you develop nausea and vomiting that is not controlled by your nausea medication, call the clinic.   BELOW ARE SYMPTOMS THAT SHOULD BE REPORTED IMMEDIATELY:  *FEVER GREATER THAN 100.5 F  *CHILLS WITH OR WITHOUT FEVER  NAUSEA AND VOMITING THAT IS NOT CONTROLLED WITH YOUR NAUSEA MEDICATION  *UNUSUAL SHORTNESS OF BREATH  *UNUSUAL BRUISING OR BLEEDING  TENDERNESS IN MOUTH AND THROAT WITH OR WITHOUT PRESENCE OF ULCERS  *URINARY PROBLEMS  *BOWEL PROBLEMS  UNUSUAL RASH Items with * indicate a potential emergency and should be followed up as soon as possible.  Feel free to call the clinic should you have any questions or concerns. The clinic phone number is [phone].  Please show the CHEMO ALERT CARD at check-in to the Emergency Department and triage nurse.   

## 2018-07-08 NOTE — Progress Notes (Signed)
Patient Care Team: [name], MD as PCP - General (Internal Medicine)  DIAGNOSIS:  Encounter Diagnosis  Name Primary?  . Malignant neoplasm of upper-inner quadrant of left breast in female, estrogen receptor positive ([place])     SUMMARY OF ONCOLOGIC HISTORY:   Malignant neoplasm of upper-inner quadrant of left breast in female, estrogen receptor positive ([place])   02/03/2018 Initial Diagnosis    Left breast palpable lump at 1130 position 2.3 cm, at 11 o'clock position 6 mm which was benign; biopsy revealed IDC grade 3 ER 30%, PR 0%, Ki-67 30%, HER-2 positive ratio 5.62, copy #16.3, axillary lymph node biopsy benign concordant, T2 N0 stage II a clinical stage AJCC 8    02/18/2018 Breast MRI    Solitary mass in left breast upper outer quadrant 2.5 cm.  No additional enhancement in the right breast.  No adenopathy    02/26/2018 - 04/15/2018 Neo-Adjuvant Chemotherapy    TCH Perjeta X 3 cycles (stopped early due to toxicities) followed by Herceptin Perjeta maintenance    06/18/2018 Breast MRI    Marked reduction in the abnormal enhancement in the upper inner left breast at the site of known cancer.  There is approximately 2.5 cm of residual linear non mass enhancement at the site of known cancer.     CHIEF COMPLIANT: Follow-up on Herceptin and Perjeta  INTERVAL HISTORY: [name] is a 72 year old with above-mentioned history of HER-2 positive left breast cancer who is currently neoadjuvant therapy.  She completed 6 cycles of treatment although she only got 3 cycles of actual chemo and then she has been on Herceptin Perjeta maintenance.  She has intermittent diarrhea but otherwise tolerating it well.  She is started on a medication for smoking cessation that is causing her to have nausea.  She is scheduled to have surgery in September.  REVIEW OF SYSTEMS:   Constitutional: Denies fevers, chills or abnormal weight loss Eyes: Denies blurriness of vision Ears, nose, mouth, throat, and  face: Denies mucositis or sore throat Respiratory: Denies cough, dyspnea or wheezes Cardiovascular: Denies palpitation, chest discomfort Gastrointestinal:  Denies nausea, heartburn or change in bowel habits Skin: Denies abnormal skin rashes Lymphatics: Denies new lymphadenopathy or easy bruising Neurological:Denies numbness, tingling or new weaknesses Behavioral/Psych: Mood is stable, no new changes  Extremities: No lower extremity edema Breast:  denies any pain or lumps or nodules in either breasts All other systems were reviewed with the patient and are negative.  I have reviewed the past medical history, past surgical history, social history and family history with the patient and they are unchanged from previous note.  ALLERGIES:  is allergic to codeine and demerol [meperidine].  MEDICATIONS:  Current Outpatient Medications  Medication Sig Dispense Refill  . amLODipine (NORVASC) 10 MG tablet Take 10 mg by mouth daily.     Marland Kitchen amoxicillin-clavulanate (AUGMENTIN) 875-125 MG tablet Take 1 tablet by mouth 2 (two) times daily. 14 tablet 0  . buPROPion (WELLBUTRIN SR) 150 MG 12 hr tablet Start one week before quit date. Take 1 tab daily x 3 days, then 1 tab BID thereafter. 60 tablet 2  . cholestyramine (QUESTRAN) 4 g packet Take 1 packet (4 g total) by mouth 3 (three) times daily. 60 each 12  . clotrimazole-betamethasone (LOTRISONE) cream Apply 1 application topically 2 (two) times daily. 30 g 0  . cyclobenzaprine (FLEXERIL) 5 MG tablet TAKE 1 TABLET (5 MG TOTAL) BY MOUTH 3 TIMES DAILY AS NEEDED FOR MUSCLE SPASMS. 30 tablet 0  .  dexamethasone (DECADRON) 4 MG tablet Take 1 tablet (4 mg total) by mouth daily. Take 1 tablet day before chemo and take 1 tablet day after chemo 12 tablet 0  . diazepam (VALIUM) 5 MG tablet Take 1 tablet (5 mg total) by mouth every 6 (six) hours as needed for anxiety. Take 1 tablet 30 minutes to one hour prior to procedure. 2 tablet 0  . diphenoxylate-atropine  (LOMOTIL) 2.5-0.025 MG tablet Take 1 tablet by mouth 4 (four) times daily as needed for diarrhea or loose stools. 60 tablet 3  . hydrochlorothiazide (HYDRODIURIL) 12.5 MG tablet Take 12.5 mg by mouth daily.     . hydrOXYzine (ATARAX/VISTARIL) 50 MG tablet Take 50 mg by mouth 3 (three) times daily as needed for anxiety.     Marland Kitchen ibuprofen (ADVIL,MOTRIN) 800 MG tablet Take 1 tablet (800 mg total) by mouth every 8 (eight) hours as needed. 30 tablet 0  . latanoprost (XALATAN) 0.005 % ophthalmic solution Place 1 drop into both eyes at bedtime.     . lidocaine-prilocaine (EMLA) cream Apply to affected area once 30 g 3  . LORazepam (ATIVAN) 0.5 MG tablet DISSOLVE 1 TABLET UNDER THE TONGUE EVERY 6 HOURS AS NEEDED FOR NAUSEA OR VOMITING 30 tablet 0  . nicotine (NICODERM CQ - DOSED IN MG/24 HOURS) 14 mg/24hr patch Place 1 patch (14 mg total) onto the skin daily. Apply 21 mg patch daily x 6 wk, then 83m patch daily x 2 wk, then 7 mg patch daily x 2 wk (Patient not taking: Reported on 06/17/2018) 14 patch 0  . nicotine (NICODERM CQ - DOSED IN MG/24 HOURS) 21 mg/24hr patch Place 1 patch (21 mg total) onto the skin daily. Apply 21 mg patch daily x 6 wk, then 159mpatch daily x 2 wk, then 7 mg patch daily x 2 wk (Patient not taking: Reported on 03/16/2018) 14 patch 2  . nicotine (NICODERM CQ - DOSED IN MG/24 HR) 7 mg/24hr patch Place 1 patch (7 mg total) onto the skin daily. Apply 21 mg patch daily x 6 wk, then 1412match daily x 2 wk, then 7 mg patch daily x 2 wk (Patient not taking: Reported on 03/16/2018) 14 patch 0  . omeprazole (PRILOSEC) 20 MG capsule Take 20 mg by mouth 2 (two) times daily before a meal.     . ondansetron (ZOFRAN) 8 MG tablet Take 1 tablet (8 mg total) by mouth 2 (two) times daily as needed for refractory nausea / vomiting. 30 tablet 1  . pramipexole (MIRAPEX) 0.25 MG tablet Take 0.25-1 mg by mouth at bedtime.     . prochlorperazine (COMPAZINE) 10 MG tablet Take 1 tablet (10 mg total) by mouth every  6 (six) hours as needed (Nausea or vomiting). 30 tablet 1   No current facility-administered medications for this visit.     PHYSICAL EXAMINATION: ECOG PERFORMANCE STATUS: 1 - Symptomatic but completely ambulatory  Vitals:   07/08/18 0903  BP: (!) 131/114  Pulse: 71  Resp: 17  Temp: 98.4 F (36.9 C)  SpO2: 100%   Filed Weights   07/08/18 0903  Weight: 148 lb 8 oz (67.4 kg)    GENERAL:alert, no distress and comfortable SKIN: skin color, texture, turgor are normal, no rashes or significant lesions EYES: normal, Conjunctiva are pink and non-injected, sclera clear OROPHARYNX:no exudate, no erythema and lips, buccal mucosa, and tongue normal  NECK: supple, thyroid normal size, non-tender, without nodularity LYMPH:  no palpable lymphadenopathy in the cervical, axillary or inguinal  LUNGS: clear to auscultation and percussion with normal breathing effort HEART: regular rate & rhythm and no murmurs and no lower extremity edema ABDOMEN:abdomen soft, non-tender and normal bowel sounds MUSCULOSKELETAL:no cyanosis of digits and no clubbing  NEURO: alert & oriented x 3 with fluent speech, no focal motor/sensory deficits EXTREMITIES: No lower extremity edema   LABORATORY DATA:  I have reviewed the data as listed CMP Latest Ref Rng & Units 07/08/2018 06/17/2018 05/27/2018  Glucose 70 - 99 mg/dL 107(H) 96 111(H)  BUN 8 - 23 mg/dL 18 23 18   Creatinine 0.44 - 1.00 mg/dL 0.94 1.16(H) 1.02(H)  Sodium 135 - 145 mmol/L 140 135 139  Potassium 3.5 - 5.1 mmol/L 4.2 4.5 4.1  Chloride 98 - 111 mmol/L 110 104 105  CO2 22 - 32 mmol/L 18(L) 23 24  Calcium 8.9 - 10.3 mg/dL 9.3 9.9 9.5  Total Protein 6.5 - 8.1 g/dL 7.3 7.7 7.4  Total Bilirubin 0.3 - 1.2 mg/dL 0.3 0.3 0.4  Alkaline Phos 38 - 126 U/L 80 90 81  AST 15 - 41 U/L 13(L) 18 15  ALT 0 - 44 U/L 11 20 16     Lab Results  Component Value Date   WBC 11.6 (H) 07/08/2018   HGB 10.8 (L) 07/08/2018   HCT 32.9 (L) 07/08/2018   MCV 86.6 07/08/2018     PLT 377 07/08/2018   NEUTROABS 9.2 (H) 07/08/2018    ASSESSMENT & PLAN:  Malignant neoplasm of upper-inner quadrant of left breast in female, estrogen receptor positive ([facility]) 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, copy #16.3, axillary lymph node biopsy benign concordant, T2 N0 stage II a clinical stage AJCC 8  Treatment plan: 1.Neoadjuvant chemotherapy with TCH Perjeta x6 cycles followed by Herceptin Perjeta maintenance for 1 year 2.followed by breast conserving surgery 60fllowed by adjuvant radiation 4.Followed by adjuvant antiestrogen therapy with letrozole for 5-7 years ------------------------------------------------------------------------------------------------------------------------ Current treatment: TCHP x3 cycles(Taxotere and carboplatin discontinued after 3 cycles), now on Herceptin Perjeta maintenance therapy  Severe diarrhea nausea and vomiting:Resolved Chemotherapy-induced anemia: Monitoring  Breast MRI 06/18/2018:Marked reduction in the abnormal enhancement in the upper inner left breast at the site of known cancer.  There is approximately 2.5 cm of residual linear non mass enhancement at the site of known cancer.  Patient has surgery scheduled in September. Continue with Herceptin Perjeta maintenance. I will see her every 6 weeks.  She will continue Herceptin Perjeta every 3 weeks.    No orders of the defined types were placed in this encounter.  The patient has a good understanding of the overall plan. she agrees with it. she will call with any problems that may develop before the next visit here.   VHarriette Ohara MD 07/08/18

## 2018-07-08 NOTE — Assessment & Plan Note (Signed)
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, copy #16.3, axillary lymph node biopsy benign concordant, T2 N0 stage II a clinical stage AJCC 8  Treatment plan: 1.Neoadjuvant chemotherapy with TCH Perjeta x6 cycles followed by Herceptin Perjeta maintenance for 1 year 2.followed by breast conserving surgery 41fllowed by adjuvant radiation 4.Followed by adjuvant antiestrogen therapy with letrozole for 5-7 years ------------------------------------------------------------------------------------------------------------------------ Current treatment: TCHP x3 cycles(Taxotere and carboplatin discontinued after 3 cycles), now on Herceptin Perjeta Cycle 5  Severe diarrhea nausea and vomiting:Resolved Chemotherapy-induced anemia: Monitoring  Breast MRI 06/18/2018:Marked reduction in the abnormal enhancement in the upper inner left breast at the site of known cancer.  There is approximately 2.5 cm of residual linear non mass enhancement at the site of known cancer.  Patient has surgery scheduled in September. Continue with Herceptin Perjeta maintenance. I will see her every 6 weeks.  She will continue Herceptin Perjeta every 3 weeks.

## 2018-07-09 ENCOUNTER — Telehealth: Payer: Self-pay | Admitting: Hematology and Oncology

## 2018-07-09 NOTE — Telephone Encounter (Signed)
Spoke to patients husband regarding upcoming appts

## 2018-07-29 ENCOUNTER — Inpatient Hospital Stay: Payer: Medicare HMO

## 2018-07-29 VITALS — BP 120/79 | HR 58 | Temp 97.7°F | Resp 18

## 2018-07-29 DIAGNOSIS — C50212 Malignant neoplasm of upper-inner quadrant of left female breast: Secondary | ICD-10-CM

## 2018-07-29 DIAGNOSIS — Z5112 Encounter for antineoplastic immunotherapy: Secondary | ICD-10-CM | POA: Diagnosis not present

## 2018-07-29 DIAGNOSIS — Z170 Estrogen receptor positive status [ER+]: Principal | ICD-10-CM

## 2018-07-29 LAB — CBC WITH DIFFERENTIAL (CANCER CENTER ONLY)
BASOS ABS: 0.1 10*3/uL (ref 0.0–0.1)
BASOS PCT: 1 %
EOS ABS: 0.1 10*3/uL (ref 0.0–0.5)
Eosinophils Relative: 1 %
HCT: 34 % — ABNORMAL LOW (ref 34.8–46.6)
Hemoglobin: 11.3 g/dL — ABNORMAL LOW (ref 11.6–15.9)
LYMPHS PCT: 27 %
Lymphs Abs: 2.4 10*3/uL (ref 0.9–3.3)
MCH: 28.7 pg (ref 25.1–34.0)
MCHC: 33.2 g/dL (ref 31.5–36.0)
MCV: 86.4 fL (ref 79.5–101.0)
MONO ABS: 0.7 10*3/uL (ref 0.1–0.9)
MONOS PCT: 8 %
Neutro Abs: 5.8 10*3/uL (ref 1.5–6.5)
Neutrophils Relative %: 63 %
Platelet Count: 343 10*3/uL (ref 145–400)
RBC: 3.94 MIL/uL (ref 3.70–5.45)
RDW: 14.3 % (ref 11.2–14.5)
WBC Count: 9 10*3/uL (ref 3.9–10.3)

## 2018-07-29 LAB — CMP (CANCER CENTER ONLY)
ALBUMIN: 4 g/dL (ref 3.5–5.0)
ALT: 9 U/L (ref 0–44)
AST: 18 U/L (ref 15–41)
Alkaline Phosphatase: 86 U/L (ref 38–126)
Anion gap: 8 (ref 5–15)
BILIRUBIN TOTAL: 0.3 mg/dL (ref 0.3–1.2)
BUN: 16 mg/dL (ref 8–23)
CALCIUM: 9.5 mg/dL (ref 8.9–10.3)
CO2: 25 mmol/L (ref 22–32)
Chloride: 108 mmol/L (ref 98–111)
Creatinine: 0.89 mg/dL (ref 0.44–1.00)
GFR, Est AFR Am: >60 mL/min (ref >60)
GFR, Estimated: >60 mL/min (ref >60)
GLUCOSE: 83 mg/dL (ref 70–99)
POTASSIUM: 4.4 mmol/L (ref 3.5–5.1)
Sodium: 141 mmol/L (ref 135–145)
TOTAL PROTEIN: 7.3 g/dL (ref 6.5–8.1)

## 2018-07-29 MED ORDER — SODIUM CHLORIDE 0.9 % IV SOLN
Freq: Once | INTRAVENOUS | Status: AC
  Administered 2018-07-29: 13:00:00 via INTRAVENOUS
  Filled 2018-07-29: qty 250

## 2018-07-29 MED ORDER — ACETAMINOPHEN 325 MG PO TABS
650.0000 mg | ORAL_TABLET | Freq: Once | ORAL | Status: AC
  Administered 2018-07-29: 650 mg via ORAL

## 2018-07-29 MED ORDER — HEPARIN SOD (PORK) LOCK FLUSH 100 UNIT/ML IV SOLN
500.0000 [IU] | Freq: Once | INTRAVENOUS | Status: AC | PRN
  Administered 2018-07-29: 500 [IU]
  Filled 2018-07-29: qty 5

## 2018-07-29 MED ORDER — SODIUM CHLORIDE 0.9% FLUSH
10.0000 mL | INTRAVENOUS | Status: DC | PRN
  Administered 2018-07-29: 10 mL
  Filled 2018-07-29: qty 10

## 2018-07-29 MED ORDER — DIPHENHYDRAMINE HCL 25 MG PO CAPS
50.0000 mg | ORAL_CAPSULE | Freq: Once | ORAL | Status: AC
  Administered 2018-07-29: 50 mg via ORAL

## 2018-07-29 MED ORDER — DIPHENHYDRAMINE HCL 25 MG PO CAPS
ORAL_CAPSULE | ORAL | Status: AC
  Filled 2018-07-29: qty 1

## 2018-07-29 MED ORDER — TRASTUZUMAB CHEMO 150 MG IV SOLR
6.0000 mg/kg | Freq: Once | INTRAVENOUS | Status: AC
  Administered 2018-07-29: 399 mg via INTRAVENOUS
  Filled 2018-07-29: qty 19

## 2018-07-29 MED ORDER — ACETAMINOPHEN 325 MG PO TABS
ORAL_TABLET | ORAL | Status: AC
  Filled 2018-07-29: qty 2

## 2018-07-29 MED ORDER — SODIUM CHLORIDE 0.9 % IV SOLN
420.0000 mg | Freq: Once | INTRAVENOUS | Status: AC
  Administered 2018-07-29: 420 mg via INTRAVENOUS
  Filled 2018-07-29: qty 14

## 2018-07-29 NOTE — Patient Instructions (Signed)
[facility] Discharge Instructions for Patients Receiving Chemotherapy  Today you received the following chemotherapy agents Herceptin and Perjeta.   To help prevent nausea and vomiting after your treatment, we encourage you to take your nausea medication as directed.   If you develop nausea and vomiting that is not controlled by your nausea medication, call the clinic.   BELOW ARE SYMPTOMS THAT SHOULD BE REPORTED IMMEDIATELY:  *FEVER GREATER THAN 100.5 F  *CHILLS WITH OR WITHOUT FEVER  NAUSEA AND VOMITING THAT IS NOT CONTROLLED WITH YOUR NAUSEA MEDICATION  *UNUSUAL SHORTNESS OF BREATH  *UNUSUAL BRUISING OR BLEEDING  TENDERNESS IN MOUTH AND THROAT WITH OR WITHOUT PRESENCE OF ULCERS  *URINARY PROBLEMS  *BOWEL PROBLEMS  UNUSUAL RASH Items with * indicate a potential emergency and should be followed up as soon as possible.  Feel free to call the clinic should you have any questions or concerns. The clinic phone number is [phone].  Please show the CHEMO ALERT CARD at check-in to the Emergency Department and triage nurse.   

## 2018-08-06 ENCOUNTER — Encounter (HOSPITAL_BASED_OUTPATIENT_CLINIC_OR_DEPARTMENT_OTHER): Payer: Self-pay | Admitting: *Deleted

## 2018-08-06 ENCOUNTER — Other Ambulatory Visit: Payer: Self-pay

## 2018-08-10 ENCOUNTER — Ambulatory Visit
Admission: RE | Admit: 2018-08-10 | Discharge: 2018-08-10 | Disposition: A | Discharge status: Home / Self Care | Payer: Medicare HMO | Source: Ambulatory Visit | Attending: Surgery | Admitting: Surgery

## 2018-08-10 DIAGNOSIS — C50912 Malignant neoplasm of unspecified site of left female breast: Secondary | ICD-10-CM

## 2018-08-10 NOTE — Progress Notes (Signed)
Ensure pre surgery drink given with instructions to complete by 0410 dos, surgical soap given with instructions, pt verbalized understanding. 

## 2018-08-12 ENCOUNTER — Ambulatory Visit (HOSPITAL_BASED_OUTPATIENT_CLINIC_OR_DEPARTMENT_OTHER): Payer: Medicare HMO | Admitting: Anesthesiology

## 2018-08-12 ENCOUNTER — Ambulatory Visit (HOSPITAL_BASED_OUTPATIENT_CLINIC_OR_DEPARTMENT_OTHER)
Admission: RE | Admit: 2018-08-12 | Discharge: 2018-08-12 | Disposition: A | Discharge status: Home / Self Care | Payer: Medicare HMO | Source: Ambulatory Visit | Attending: Surgery | Admitting: Surgery

## 2018-08-12 ENCOUNTER — Ambulatory Visit
Admission: RE | Admit: 2018-08-12 | Discharge: 2018-08-12 | Disposition: A | Discharge status: Home / Self Care | Payer: Medicare HMO | Source: Ambulatory Visit | Attending: Surgery | Admitting: Surgery

## 2018-08-12 ENCOUNTER — Other Ambulatory Visit: Payer: Self-pay

## 2018-08-12 ENCOUNTER — Ambulatory Visit (HOSPITAL_COMMUNITY)
Admission: RE | Admit: 2018-08-12 | Discharge: 2018-08-12 | Disposition: A | Discharge status: Home / Self Care | Payer: Medicare HMO | Source: Ambulatory Visit | Attending: Surgery | Admitting: Surgery

## 2018-08-12 ENCOUNTER — Encounter (HOSPITAL_BASED_OUTPATIENT_CLINIC_OR_DEPARTMENT_OTHER): Payer: Self-pay | Admitting: Anesthesiology

## 2018-08-12 ENCOUNTER — Encounter (HOSPITAL_BASED_OUTPATIENT_CLINIC_OR_DEPARTMENT_OTHER)
Admission: RE | Disposition: A | Discharge status: Home / Self Care | Payer: Self-pay | Source: Ambulatory Visit | Attending: Surgery

## 2018-08-12 DIAGNOSIS — K219 Gastro-esophageal reflux disease without esophagitis: Secondary | ICD-10-CM | POA: Insufficient documentation

## 2018-08-12 DIAGNOSIS — C50112 Malignant neoplasm of central portion of left female breast: Secondary | ICD-10-CM | POA: Diagnosis not present

## 2018-08-12 DIAGNOSIS — Z885 Allergy status to narcotic agent status: Secondary | ICD-10-CM | POA: Insufficient documentation

## 2018-08-12 DIAGNOSIS — M1990 Unspecified osteoarthritis, unspecified site: Secondary | ICD-10-CM | POA: Insufficient documentation

## 2018-08-12 DIAGNOSIS — Z9221 Personal history of antineoplastic chemotherapy: Secondary | ICD-10-CM | POA: Diagnosis not present

## 2018-08-12 DIAGNOSIS — F419 Anxiety disorder, unspecified: Secondary | ICD-10-CM | POA: Insufficient documentation

## 2018-08-12 DIAGNOSIS — C50912 Malignant neoplasm of unspecified site of left female breast: Secondary | ICD-10-CM

## 2018-08-12 DIAGNOSIS — Z79899 Other long term (current) drug therapy: Secondary | ICD-10-CM | POA: Insufficient documentation

## 2018-08-12 DIAGNOSIS — I10 Essential (primary) hypertension: Secondary | ICD-10-CM | POA: Insufficient documentation

## 2018-08-12 HISTORY — PX: BREAST LUMPECTOMY: SHX2

## 2018-08-12 HISTORY — PX: BREAST LUMPECTOMY WITH RADIOACTIVE SEED AND SENTINEL LYMPH NODE BIOPSY: SHX6550

## 2018-08-12 SURGERY — BREAST LUMPECTOMY WITH RADIOACTIVE SEED AND SENTINEL LYMPH NODE BIOPSY
Anesthesia: General | Site: Breast | Laterality: Left

## 2018-08-12 MED ORDER — MIDAZOLAM HCL 2 MG/2ML IJ SOLN: 1.0000 mg | INTRAMUSCULAR | Status: DC | PRN

## 2018-08-12 MED ORDER — PROPOFOL 500 MG/50ML IV EMUL
INTRAVENOUS | Status: AC
  Filled 2018-08-12: qty 50

## 2018-08-12 MED ORDER — LACTATED RINGERS IV SOLN
INTRAVENOUS | Status: DC
  Administered 2018-08-12: 07:00:00 via INTRAVENOUS

## 2018-08-12 MED ORDER — PROPOFOL 10 MG/ML IV BOLUS
INTRAVENOUS | Status: DC | PRN
  Administered 2018-08-12: 150 mg via INTRAVENOUS

## 2018-08-12 MED ORDER — CEFAZOLIN SODIUM-DEXTROSE 2-4 GM/100ML-% IV SOLN
INTRAVENOUS | Status: AC
  Filled 2018-08-12: qty 100

## 2018-08-12 MED ORDER — MIDAZOLAM HCL 2 MG/2ML IJ SOLN
INTRAMUSCULAR | Status: AC
  Filled 2018-08-12: qty 2

## 2018-08-12 MED ORDER — CELECOXIB 100 MG PO CAPS
ORAL_CAPSULE | ORAL | Status: AC
  Filled 2018-08-12: qty 1

## 2018-08-12 MED ORDER — SODIUM CHLORIDE 0.9 % IJ SOLN
INTRAMUSCULAR | Status: AC
  Filled 2018-08-12: qty 10

## 2018-08-12 MED ORDER — CELECOXIB 200 MG PO CAPS
ORAL_CAPSULE | ORAL | Status: AC
  Filled 2018-08-12: qty 1

## 2018-08-12 MED ORDER — EPHEDRINE 5 MG/ML INJ
INTRAVENOUS | Status: AC
  Filled 2018-08-12: qty 10

## 2018-08-12 MED ORDER — BUPIVACAINE-EPINEPHRINE (PF) 0.25% -1:200000 IJ SOLN
INTRAMUSCULAR | Status: DC | PRN
  Administered 2018-08-12: 20 mL

## 2018-08-12 MED ORDER — LIDOCAINE 2% (20 MG/ML) 5 ML SYRINGE
INTRAMUSCULAR | Status: AC
  Filled 2018-08-12: qty 5

## 2018-08-12 MED ORDER — DEXAMETHASONE SODIUM PHOSPHATE 10 MG/ML IJ SOLN
INTRAMUSCULAR | Status: AC
  Filled 2018-08-12: qty 1

## 2018-08-12 MED ORDER — SCOPOLAMINE 1 MG/3DAYS TD PT72: 1.0000 | MEDICATED_PATCH | Freq: Once | TRANSDERMAL | Status: DC | PRN

## 2018-08-12 MED ORDER — ONDANSETRON HCL 4 MG/2ML IJ SOLN: 4.0000 mg | Freq: Once | INTRAMUSCULAR | Status: DC | PRN

## 2018-08-12 MED ORDER — CELECOXIB 400 MG PO CAPS: 400.0000 mg | ORAL_CAPSULE | ORAL | Status: DC

## 2018-08-12 MED ORDER — ACETAMINOPHEN 500 MG PO TABS: 1000.0000 mg | ORAL_TABLET | ORAL | Status: DC

## 2018-08-12 MED ORDER — GABAPENTIN 300 MG PO CAPS
ORAL_CAPSULE | ORAL | Status: AC
  Filled 2018-08-12: qty 1

## 2018-08-12 MED ORDER — BUPIVACAINE-EPINEPHRINE (PF) 0.5% -1:200000 IJ SOLN
INTRAMUSCULAR | Status: DC | PRN
  Administered 2018-08-12: 30 mL via PERINEURAL

## 2018-08-12 MED ORDER — FENTANYL CITRATE (PF) 100 MCG/2ML IJ SOLN
50.0000 ug | INTRAMUSCULAR | Status: DC | PRN
  Administered 2018-08-12: 100 ug via INTRAVENOUS

## 2018-08-12 MED ORDER — FENTANYL CITRATE (PF) 100 MCG/2ML IJ SOLN
25.0000 ug | INTRAMUSCULAR | Status: DC | PRN
  Administered 2018-08-12 (×3): 25 ug via INTRAVENOUS

## 2018-08-12 MED ORDER — CHLORHEXIDINE GLUCONATE CLOTH 2 % EX PADS: 6.0000 | MEDICATED_PAD | Freq: Once | CUTANEOUS | Status: DC

## 2018-08-12 MED ORDER — ACETAMINOPHEN 500 MG PO TABS
ORAL_TABLET | ORAL | Status: AC
  Filled 2018-08-12: qty 2

## 2018-08-12 MED ORDER — GABAPENTIN 300 MG PO CAPS
300.0000 mg | ORAL_CAPSULE | ORAL | Status: AC
  Administered 2018-08-12: 300 mg via ORAL

## 2018-08-12 MED ORDER — OXYCODONE HCL 5 MG PO TABS: 5.0000 mg | ORAL_TABLET | Freq: Four times a day (QID) | ORAL | 0 refills | Status: DC | PRN

## 2018-08-12 MED ORDER — CELECOXIB 200 MG PO CAPS
200.0000 mg | ORAL_CAPSULE | ORAL | Status: AC
  Administered 2018-08-12: 200 mg via ORAL

## 2018-08-12 MED ORDER — ONDANSETRON HCL 4 MG/2ML IJ SOLN
INTRAMUSCULAR | Status: AC
  Filled 2018-08-12: qty 2

## 2018-08-12 MED ORDER — CLONIDINE HCL (ANALGESIA) 100 MCG/ML EP SOLN
EPIDURAL | Status: DC | PRN
  Administered 2018-08-12: 50 ug

## 2018-08-12 MED ORDER — FENTANYL CITRATE (PF) 100 MCG/2ML IJ SOLN
INTRAMUSCULAR | Status: AC
  Filled 2018-08-12: qty 2

## 2018-08-12 MED ORDER — IBUPROFEN 800 MG PO TABS: 800.0000 mg | ORAL_TABLET | Freq: Three times a day (TID) | ORAL | 0 refills | Status: DC | PRN

## 2018-08-12 MED ORDER — GABAPENTIN 300 MG PO CAPS: 300.0000 mg | ORAL_CAPSULE | ORAL | Status: DC

## 2018-08-12 MED ORDER — BUPIVACAINE-EPINEPHRINE (PF) 0.25% -1:200000 IJ SOLN
INTRAMUSCULAR | Status: AC
  Filled 2018-08-12: qty 30

## 2018-08-12 MED ORDER — SODIUM CHLORIDE 0.9 % IJ SOLN
INTRAVENOUS | Status: DC | PRN
  Administered 2018-08-12: 5 mL

## 2018-08-12 MED ORDER — ACETAMINOPHEN 500 MG PO TABS
1000.0000 mg | ORAL_TABLET | ORAL | Status: AC
  Administered 2018-08-12: 1000 mg via ORAL

## 2018-08-12 MED ORDER — CEFAZOLIN SODIUM-DEXTROSE 2-4 GM/100ML-% IV SOLN
2.0000 g | INTRAVENOUS | Status: AC
  Administered 2018-08-12: 2 g via INTRAVENOUS

## 2018-08-12 MED ORDER — LIDOCAINE 2% (20 MG/ML) 5 ML SYRINGE
INTRAMUSCULAR | Status: DC | PRN
  Administered 2018-08-12: 20 mg via INTRAVENOUS

## 2018-08-12 MED ORDER — DEXAMETHASONE SODIUM PHOSPHATE 4 MG/ML IJ SOLN
INTRAMUSCULAR | Status: DC | PRN
  Administered 2018-08-12: 10 mg via INTRAVENOUS

## 2018-08-12 MED ORDER — EPHEDRINE SULFATE 50 MG/ML IJ SOLN
INTRAMUSCULAR | Status: DC | PRN
  Administered 2018-08-12 (×2): 10 mg via INTRAVENOUS

## 2018-08-12 MED ORDER — TECHNETIUM TC 99M SULFUR COLLOID FILTERED
1.0000 | Freq: Once | INTRAVENOUS | Status: AC | PRN
  Administered 2018-08-12: 1 via INTRADERMAL

## 2018-08-12 MED ORDER — METHYLENE BLUE 0.5 % INJ SOLN
INTRAVENOUS | Status: AC
  Filled 2018-08-12: qty 10

## 2018-08-12 SURGICAL SUPPLY — 54 items
ADH SKN CLS APL DERMABOND .7 (GAUZE/BANDAGES/DRESSINGS) ×1
APPLIER CLIP 9.375 MED OPEN (MISCELLANEOUS) ×3
APR CLP MED 9.3 20 MLT OPN (MISCELLANEOUS) ×1
BINDER BREAST LRG (GAUZE/BANDAGES/DRESSINGS) ×2 IMPLANT
BINDER BREAST MEDIUM (GAUZE/BANDAGES/DRESSINGS) IMPLANT
BINDER BREAST XLRG (GAUZE/BANDAGES/DRESSINGS) IMPLANT
BINDER BREAST XXLRG (GAUZE/BANDAGES/DRESSINGS) IMPLANT
BLADE SURG 15 STRL LF DISP TIS (BLADE) ×1 IMPLANT
BLADE SURG 15 STRL SS (BLADE) ×3
CANISTER SUC SOCK COL 7IN (MISCELLANEOUS) IMPLANT
CANISTER SUCT 1200ML W/VALVE (MISCELLANEOUS) ×3 IMPLANT
CHLORAPREP W/TINT 26ML (MISCELLANEOUS) ×3 IMPLANT
CLIP APPLIE 9.375 MED OPEN (MISCELLANEOUS) ×1 IMPLANT
COVER BACK TABLE 60X90IN (DRAPES) ×3 IMPLANT
COVER MAYO STAND STRL (DRAPES) ×3 IMPLANT
COVER PROBE W GEL 5X96 (DRAPES) ×3 IMPLANT
DECANTER SPIKE VIAL GLASS SM (MISCELLANEOUS) IMPLANT
DERMABOND ADVANCED (GAUZE/BANDAGES/DRESSINGS) ×2
DERMABOND ADVANCED .7 DNX12 (GAUZE/BANDAGES/DRESSINGS) ×1 IMPLANT
DEVICE DUBIN W/COMP PLATE 8390 (MISCELLANEOUS) ×3 IMPLANT
DRAPE LAPAROSCOPIC ABDOMINAL (DRAPES) ×3 IMPLANT
DRAPE UTILITY XL STRL (DRAPES) ×3 IMPLANT
ELECT COATED BLADE 2.86 ST (ELECTRODE) ×3 IMPLANT
ELECT REM PT RETURN 9FT ADLT (ELECTROSURGICAL) ×3
ELECTRODE REM PT RTRN 9FT ADLT (ELECTROSURGICAL) ×1 IMPLANT
GLOVE BIOGEL PI IND STRL 6.5 (GLOVE) IMPLANT
GLOVE BIOGEL PI IND STRL 8 (GLOVE) ×1 IMPLANT
GLOVE BIOGEL PI INDICATOR 6.5 (GLOVE) ×2
GLOVE BIOGEL PI INDICATOR 8 (GLOVE) ×2
GLOVE ECLIPSE 8.0 STRL XLNG CF (GLOVE) ×3 IMPLANT
GLOVE EXAM NITRILE MD LF STRL (GLOVE) ×2 IMPLANT
GLOVE SURG SS PI 6.5 STRL IVOR (GLOVE) ×2 IMPLANT
GOWN STRL REUS W/ TWL LRG LVL3 (GOWN DISPOSABLE) ×2 IMPLANT
GOWN STRL REUS W/TWL LRG LVL3 (GOWN DISPOSABLE) ×6
HEMOSTAT ARISTA ABSORB 3G PWDR (MISCELLANEOUS) IMPLANT
HEMOSTAT SNOW SURGICEL 2X4 (HEMOSTASIS) IMPLANT
KIT MARKER MARGIN INK (KITS) ×3 IMPLANT
NDL HYPO 25X1 1.5 SAFETY (NEEDLE) ×1 IMPLANT
NDL SAFETY ECLIPSE 18X1.5 (NEEDLE) IMPLANT
NEEDLE HYPO 18GX1.5 SHARP (NEEDLE) ×3
NEEDLE HYPO 25X1 1.5 SAFETY (NEEDLE) ×6 IMPLANT
NS IRRIG 1000ML POUR BTL (IV SOLUTION) ×3 IMPLANT
PACK BASIN DAY SURGERY FS (CUSTOM PROCEDURE TRAY) ×3 IMPLANT
PENCIL BUTTON HOLSTER BLD 10FT (ELECTRODE) ×3 IMPLANT
SLEEVE SCD COMPRESS KNEE MED (MISCELLANEOUS) ×3 IMPLANT
SPONGE LAP 4X18 RFD (DISPOSABLE) ×3 IMPLANT
SUT MNCRL AB 4-0 PS2 18 (SUTURE) ×3 IMPLANT
SUT VICRYL 3-0 CR8 SH (SUTURE) ×3 IMPLANT
SYR CONTROL 10ML LL (SYRINGE) ×5 IMPLANT
TOWEL GREEN STERILE FF (TOWEL DISPOSABLE) ×3 IMPLANT
TOWEL OR NON WOVEN STRL DISP B (DISPOSABLE) ×1 IMPLANT
TUBE CONNECTING 20'X1/4 (TUBING) ×1
TUBE CONNECTING 20X1/4 (TUBING) ×2 IMPLANT
YANKAUER SUCT BULB TIP NO VENT (SUCTIONS) ×3 IMPLANT

## 2018-08-12 NOTE — Interval H&P Note (Signed)
History and Physical Interval Note:  08/12/2018 7:19 AM  [name]  has presented today for surgery, with the diagnosis of LEFT BREAST CANCER  The various methods of treatment have been discussed with the patient and family. After consideration of risks, benefits and other options for treatment, the patient has consented to  Procedure(s): LEFT BREAST LUMPECTOMY WITH RADIOACTIVE SEED AND SENTINEL LYMPH NODE BIOPSY (Left) as a surgical intervention .  The patient's history has been reviewed, patient examined, no change in status, stable for surgery.  I have reviewed the patient's chart and labs.  Questions were answered to the patient's satisfaction.     [place]

## 2018-08-12 NOTE — Transfer of Care (Signed)
Immediate Anesthesia Transfer of Care Note  Patient: [name]  Procedure(s) Performed: LEFT BREAST LUMPECTOMY WITH RADIOACTIVE SEED AND SENTINEL LYMPH NODE BIOPSY (Left Breast)  Patient Location: PACU  Anesthesia Type:General and Regional  Level of Consciousness: awake and sedated  Airway & Oxygen Therapy: Patient Spontanous Breathing and Patient connected to face mask oxygen  Post-op Assessment: Report given to RN and Post -op Vital signs reviewed and stable  Post vital signs: Reviewed and stable  Last Vitals:  Vitals Value Taken Time  BP 133/65 08/12/2018  8:43 AM  Temp    Pulse 68 08/12/2018  8:44 AM  Resp 10 08/12/2018  8:44 AM  SpO2 99 % 08/12/2018  8:44 AM  Vitals shown include unvalidated device data.  Last Pain:  Vitals:   08/12/18 0654  TempSrc: Oral  PainSc: 5       Patients Stated Pain Goal: 3 (88/32/54 9826)  Complications: No apparent anesthesia complications

## 2018-08-12 NOTE — Anesthesia Procedure Notes (Signed)
Anesthesia Regional Block: Pectoralis block   Pre-Anesthetic Checklist: ,, timeout performed, Correct Patient, Correct Site, Correct Laterality, Correct Procedure, Correct Position, site marked, Risks and benefits discussed,  Surgical consent,  Pre-op evaluation,  At surgeon's request and post-op pain management  Laterality: Left  Prep: chloraprep       Needles:  Injection technique: Single-shot  Needle Type: Echogenic Needle     Needle Length: 9cm  Needle Gauge: 21     Additional Needles:   Procedures:,,,, ultrasound used (permanent image in chart),,,,  Narrative:  Start time: 08/12/2018 7:15 AM End time: 08/12/2018 7:22 AM Injection made incrementally with aspirations every 5 mL.  Performed by: Personally  Anesthesiologist: Suzette Battiest, MD

## 2018-08-12 NOTE — H&P (Signed)
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 She had her MRI today. The report is not out but I have reviewed her films and there appears to be a complete response. The patient states she can no longer feel a mass in her left breast. The area is sore after treatment. She did have issues tolerating chemotherapy and has completed most of her regimen. She has 2 more treatments in August. She is interested in breast conserving surgery.  The patient is a 72 year old female.   Allergies  Codeine/Codeine Derivatives  Medication History  AmLODIPine Besylate (10MG Tablet, Oral) Active. Amoxicillin-Pot Clavulanate (875-125MG Tablet, Oral) Active. BuPROPion HCl ER (SR) (150MG Tablet ER 12HR, Oral) Active. Clotrimazole-Betamethasone (1-0.05% Cream, External) Active. Dexamethasone (4MG Tablet, Oral) Active. Diphenoxylate-Atropine (2.5-0.025MG Tablet, Oral) Active. HydrOXYzine HCl (50MG Tablet, Oral) Active. Latanoprost (0.005% Solution, Ophthalmic) Active. Lidocaine-Prilocaine (2.5-2.5% Cream, External) Active. LORazepam (0.5MG Tablet, Oral) Active. Omeprazole (20MG Capsule DR, Oral) Active. Ondansetron HCl (8MG Tablet, Oral) Active. Prochlorperazine Maleate (10MG Tablet, Oral) Active. Pramipexole Dihydrochloride (0.25MG Tablet, Oral) Active. HydroCHLOROthiazide (12.5MG Capsule, Oral) Active. TraZODone HCl (100MG Tablet, Oral) Active. Norvasc (5MG Tablet, Oral) Active. Zantac (150MG Tablet, Oral) Active. Medications Reconciled    Vitals   Weight: 145.38 lb Height: 63in Body Surface Area: 1.69 m Body Mass Index: 25.75 kg/m  Temp.: 54F(Oral)  Pulse: 88 (Regular)  BP: 132/82  (Sitting, Left Arm, Standard)      Physical Exam   General Mental Status-Alert. General Appearance-Consistent with stated age. Hydration-Well hydrated. Voice-Normal.  Head and Neck Head-normocephalic, atraumatic with no lesions or palpable masses. Trachea-midline. Thyroid Gland Characteristics - normal size and consistency.  Chest and Lung Exam Chest and lung exam reveals -quiet, even and easy respiratory effort with no use of accessory muscles and on auscultation, normal breath sounds, no adventitious sounds and normal vocal resonance. Inspection Chest Wall - Normal. Back - normal.  Breast Note: Left breast reveals no evidence of mass lesion. No evidence of left axillary adenopathy. Right breast is normal.  Cardiovascular Cardiovascular examination reveals -normal heart sounds, regular rate and rhythm with no murmurs and normal pedal pulses bilaterally.  Neurologic Neurologic evaluation reveals -alert and oriented x 3 with no impairment of recent or remote memory. Mental Status-Normal.  Musculoskeletal Normal Exam - Left-Upper Extremity Strength Normal and Lower Extremity Strength Normal. Normal Exam - Right-Upper Extremity Strength Normal and Lower Extremity Strength Normal.  Lymphatic Head & Neck  General Head & Neck Lymphatics: Bilateral - Description - Normal. Axillary  General Axillary Region: Bilateral - Description - Normal. Tenderness - Non Tender.    Assessment & Plan  BREAST CANCER, LEFT (C50.912) Impression: Stage II  Complete response on MRI and exam to chemotherapy. Pt has opted for breast conservation  She would like to proceed with breast conserving surgery. Risk of lumpectomy include bleeding, infection, seroma, more surgery, use of seed/wire, wound care, cosmetic deformity and the need for other treatments, death , blood clots, death. Pt agrees to proceed. Risk of sentinel lymph node mapping  include bleeding, infection, lymphedema, shoulder pain. stiffness, dye allergy. cosmetic deformity , blood clots, death, need for more surgery. Pt agres to proceed.  Current Plans You are being scheduled for surgery- Our schedulers will call you.  You should hear from our office's scheduling  department within 5 working days about the location, date, and time of surgery. We try to make accommodations for patient's preferences in scheduling surgery, but sometimes the OR schedule or the surgeon's schedule prevents [place] from making those accommodations.  If you have not heard from our office [phone]) in 5 working days, call the office and ask for your surgeon's nurse.  If you have other questions about your diagnosis, plan, or surgery, call the office and ask for your surgeon's nurse.  Pt Education - [facility] Breast Cancer Information Given - [facility] "Breast Journey" Package We discussed the staging and pathophysiology of breast cancer. We discussed all of the different options for treatment for breast cancer including surgery, chemotherapy, radiation therapy, Herceptin, and antiestrogen therapy. We discussed a sentinel lymph node biopsy as she does not appear to having lymph node involvement right now. We discussed the performance of that with injection of radioactive tracer and blue dye. We discussed that she would have an incision underneath her axillary hairline. We discussed that there is a bout a 10-20% chance of having a positive node with a sentinel lymph node biopsy and we will await the permanent pathology to make any other first further decisions in terms of her treatment. One of these options might be to return to the operating room to perform an axillary lymph node dissection. We discussed about a 1-2% risk lifetime of chronic shoulder pain as well as lymphedema associated with a sentinel lymph node biopsy. We discussed the options for treatment of the breast cancer which included  lumpectomy versus a mastectomy. We discussed the performance of the lumpectomy with a wire placement. We discussed a 10-20% chance of a positive margin requiring reexcision in the operating room. We also discussed that she may need radiation therapy or antiestrogen therapy or both if she undergoes lumpectomy. We discussed the mastectomy and the postoperative care for that as well. We discussed that there is no difference in her survival whether she undergoes lumpectomy with radiation therapy or antiestrogen therapy versus a mastectomy. There is a slight difference in the local recurrence rate being 3-5% with lumpectomy and about 1% with a mastectomy. We discussed the risks of operation including bleeding, infection, possible reoperation. She understands her further therapy will be based on what her stages at the time of her operation.  Pt Education - [facility] breast cancer surgery: discussed with patient and provided information. Pt Education - [facility] Breast Biopsy HCI: discussed with patient and provided information. Pt Education - ABC (After Breast Cancer) Class Info: discussed with patient and provided information. Pt Education - [facility] Breast Pains Education

## 2018-08-12 NOTE — Anesthesia Preprocedure Evaluation (Signed)
Anesthesia Evaluation  Patient identified by MRN, date of birth, ID band Patient awake    Reviewed: Allergy & Precautions, NPO status , Patient's Chart, lab work & pertinent test results  Airway Mallampati: II  TM Distance: >3 FB Neck ROM: Full    Dental  (+) Dental Advisory Given, Partial Upper   Pulmonary Current Smoker,    Pulmonary exam normal breath sounds clear to auscultation       Cardiovascular hypertension, Pt. on medications + Past MI  Normal cardiovascular exam Rhythm:Regular Rate:Normal     Neuro/Psych PSYCHIATRIC DISORDERS Anxiety negative neurological ROS     GI/Hepatic Neg liver ROS, GERD  Medicated and Poorly Controlled,  Endo/Other  negative endocrine ROS  Renal/GU negative Renal ROS     Musculoskeletal  (+) Arthritis ,   Abdominal   Peds  Hematology negative hematology ROS (+)   Anesthesia Other Findings Day of surgery medications reviewed with the patient.  Breast cancer  Reproductive/Obstetrics                             Anesthesia Physical  Anesthesia Plan  ASA: III  Anesthesia Plan: General   Post-op Pain Management: GA combined w/ Regional for post-op pain   Induction: Intravenous  PONV Risk Score and Plan: 2 and Dexamethasone and Ondansetron  Airway Management Planned: LMA  Additional Equipment:   Intra-op Plan:   Post-operative Plan: Extubation in OR  Informed Consent: I have reviewed the patients History and Physical, chart, labs and discussed the procedure including the risks, benefits and alternatives for the proposed anesthesia with the patient or authorized representative who has indicated his/her understanding and acceptance.   Dental advisory given  Plan Discussed with: CRNA  Anesthesia Plan Comments:         Anesthesia Quick Evaluation

## 2018-08-12 NOTE — Progress Notes (Signed)
Assisted Dr. Rob Fitzgerald with left, ultrasound guided, pectoralis block and nuc med tech with nuc med inj. Side rails up, monitors on throughout procedure. See vital signs in flow sheet. Tolerated Procedure well. 

## 2018-08-12 NOTE — Anesthesia Procedure Notes (Signed)
Procedure Name: LMA Insertion Performed by: Pope Brunty W, CRNA Pre-anesthesia Checklist: Patient identified, Emergency Drugs available, Suction available and Patient being monitored Patient Re-evaluated:Patient Re-evaluated prior to induction Oxygen Delivery Method: Circle system utilized Preoxygenation: Pre-oxygenation with 100% oxygen Induction Type: IV induction Ventilation: Mask ventilation without difficulty LMA: LMA inserted LMA Size: 4.0 Number of attempts: 1 Placement Confirmation: positive ETCO2 Tube secured with: Tape Dental Injury: Teeth and Oropharynx as per pre-operative assessment        

## 2018-08-12 NOTE — Anesthesia Postprocedure Evaluation (Signed)
Anesthesia Post Note  Patient: [name]  Procedure(s) Performed: LEFT BREAST LUMPECTOMY WITH RADIOACTIVE SEED AND SENTINEL LYMPH NODE BIOPSY (Left Breast)     Patient location during evaluation: PACU Anesthesia Type: General Level of consciousness: awake and alert Pain management: pain level controlled Vital Signs Assessment: post-procedure vital signs reviewed and stable Respiratory status: spontaneous breathing, nonlabored ventilation, respiratory function stable and patient connected to nasal cannula oxygen Cardiovascular status: blood pressure returned to baseline and stable Postop Assessment: no apparent nausea or vomiting Anesthetic complications: no    Last Vitals:  Vitals:   08/12/18 0930 08/12/18 0945  BP: (!) 146/76 140/73  Pulse: (!) 59 63  Resp: 14 15  Temp:    SpO2: 99% 100%    Last Pain:  Vitals:   08/12/18 0930  TempSrc:   PainSc: 6                  Tiajuana Amass

## 2018-08-12 NOTE — Discharge Instructions (Signed)
NO TYLENOL BEFORE 1 PM TODAY!      [place] Office Phone Number [phone]  BREAST BIOPSY/ PARTIAL MASTECTOMY: POST OP INSTRUCTIONS  Always review your discharge instruction sheet given to you by the facility where your surgery was performed.  IF YOU HAVE DISABILITY OR FAMILY LEAVE FORMS, YOU MUST BRING THEM TO THE OFFICE FOR PROCESSING.  DO NOT GIVE THEM TO YOUR DOCTOR.  1. A prescription for pain medication may be given to you upon discharge.  Take your pain medication as prescribed, if needed.  If narcotic pain medicine is not needed, then you may take acetaminophen (Tylenol) or ibuprofen (Advil) as needed. No Tylenol until 1:00pm, no ibuprofen until 3:00pm 2. Take your usually prescribed medications unless otherwise directed 3. If you need a refill on your pain medication, please contact your pharmacy.  They will contact our office to request authorization.  Prescriptions will not be filled after 5pm or on week-ends. 4. You should eat very light the first 24 hours after surgery, such as soup, crackers, pudding, etc.  Resume your normal diet the day after surgery. 5. Most patients will experience some swelling and bruising in the breast.  Ice packs and a good support bra will help.  Swelling and bruising can take several days to resolve.  6. It is common to experience some constipation if taking pain medication after surgery.  Increasing fluid intake and taking a stool softener will usually help or prevent this problem from occurring.  A mild laxative (Milk of Magnesia or Miralax) should be taken according to package directions if there are no bowel movements after 48 hours. 7. Unless discharge instructions indicate otherwise, you may remove your bandages 24-48 hours after surgery, and you may shower at that time.  You may have steri-strips (small skin tapes) in place directly over the incision.  These strips should be left on the skin for 7-10 days.  If your surgeon  used skin glue on the incision, you may shower in 24 hours.  The glue will flake off over the next 2-3 weeks.  Any sutures or staples will be removed at the office during your follow-up visit. 8. ACTIVITIES:  You may resume regular daily activities (gradually increasing) beginning the next day.  Wearing a good support bra or sports bra minimizes pain and swelling.  You may have sexual intercourse when it is comfortable. a. You may drive when you no longer are taking prescription pain medication, you can comfortably wear a seatbelt, and you can safely maneuver your car and apply brakes. b. RETURN TO WORK:  ______________________________________________________________________________________ 9. You should see your doctor in the office for a follow-up appointment approximately two weeks after your surgery.  Your doctors nurse will typically make your follow-up appointment when she calls you with your pathology report.  Expect your pathology report 2-3 business days after your surgery.  You may call to check if you do not hear from [place] after three days. 10. OTHER INSTRUCTIONS: _______________________________________________________________________________________________ _____________________________________________________________________________________________________________________________________ _____________________________________________________________________________________________________________________________________ _____________________________________________________________________________________________________________________________________  WHEN TO CALL YOUR DOCTOR: 1. Fever over 101.0 2. Nausea and/or vomiting. 3. Extreme swelling or bruising. 4. Continued bleeding from incision. 5. Increased pain, redness, or drainage from the incision.  The clinic staff is available to answer your questions during regular business hours.  Please dont hesitate to call and ask to speak to one  of the nurses for clinical concerns.  If you have a medical emergency, go to the nearest emergency room or call 911.  A surgeon  from [facility] is always on call at the hospital.  For further questions, please visit [email or web address]     Post Anesthesia Home Care Instructions  Activity: Get plenty of rest for the remainder of the day. A responsible individual must stay with you for 24 hours following the procedure.  For the next 24 hours, DO NOT: -Drive a car -Paediatric nurse -Drink alcoholic beverages -Take any medication unless instructed by your physician -Make any legal decisions or sign important papers.  Meals: Start with liquid foods such as gelatin or soup. Progress to regular foods as tolerated. Avoid greasy, spicy, heavy foods. If nausea and/or vomiting occur, drink only clear liquids until the nausea and/or vomiting subsides. Call your physician if vomiting continues.  Special Instructions/Symptoms: Your throat may feel dry or sore from the anesthesia or the breathing tube placed in your throat during surgery. If this causes discomfort, gargle with warm salt water. The discomfort should disappear within 24 hours.  If you had a scopolamine patch placed behind your ear for the management of post- operative nausea and/or vomiting:  1. The medication in the patch is effective for 72 hours, after which it should be removed.  Wrap patch in a tissue and discard in the trash. Wash hands thoroughly with soap and water. 2. You may remove the patch earlier than 72 hours if you experience unpleasant side effects which may include dry mouth, dizziness or visual disturbances. 3. Avoid touching the patch. Wash your hands with soap and water after contact with the patch.

## 2018-08-12 NOTE — CV Procedure (Signed)
Preoperative diagnosis: Stage II left breast cancer  Postoperative diagnosis: Same  Procedure: Left breast seed localized lumpectomy with left axillary sentinel lymph node mapping using methylene blue dye deep left axillary sentinel node  Surgeon: Erroll Luna, MD  Anesthesia: LMA with local and pectoral block  EBL: 20 cc  Specimen: Left breast tissue with localizing seed and clips verified by Faxitron  3 left axillary sentinel nodes one was blue and hot the other 2 were blue  Drains: None  IV fluids: Per anesthesia record  Indications for procedure: The patient is a 72-year-old female who is completed neoadjuvant chemotherapy for stage II a left breast cancer.  She has had a complete radiological response and wishes to conserve her breast and presents today for lumpectomy and sentinel lymph node mapping.The procedure has been discussed with the patient. Alternatives to surgery have been discussed with the patient.  Risks of surgery include bleeding,  Infection,  Seroma formation, death,  and the need for further surgery.   The patient understands and wishes to proceed. Sentinel lymph node mapping and dissection has been discussed with the patient.  Risk of bleeding,  Infection,  Seroma formation,  Additional procedures,,  Shoulder weakness ,  Shoulder stiffness,reaction to medicines  Nerve and blood vessel injury and reaction to the mapping dyes have been discussed.  Alternatives to surgery have been discussed with the patient.  The patient agrees to proceed.    Description of procedure: The patient was met in the holding area.  Neoprobe used to verify the seed location and activity.  Left breast marked as the correct side and questions answered.  She underwent pectoral block by anesthesia and injection of radioactive tracer by nuclear medicine.  All questions were answered.  She was taken to the operating room and placed supine.  After induction of general anesthesia, the left breast was  prepped and draped in sterile fashion and timeout was performed.  She received appropriate preoperative antibiotics.  This was marked as the correct side.  Neoprobe was used and seed identified in the left breast upper central region.  Her films were available for review.  Local anesthetic was infiltrated.  Transverse incision was made in this location in the upper central breast and dissection was carried around all tissue around the seed and multiple biopsy clips were removed.  Margins were grossly negative.  Faxitron image revealed the seating clip to be in the specimen.  The cavity was irrigated.  It was made hemostatic with cautery.  He was then closed with a deep layer 3-0 Vicryl and 4-0 Monocryl.  Prior to closure clips were placed.  4 cc of methylene blue dye were injected to the nipple massage for 5 minutes.  The neoprobe settings were changed to technetium.  The left axilla was interrogated hot spot identified in the left axilla.  Local anesthetic infiltrated in the left axillary crease.  Incision made a 4 cm.  Dissection was carried down through the subcutaneous fat into the level 1 nodes deep.  A hot blue node was identified and removed.  2 of the blue nodes were removed and background counts approaches 0.  These were sent off to pathology.  Hemostasis achieved with cautery.  Irrigation used and suctioned out until clear.  Wound closed with 3-0 Vicryl and 4-0 Monocryl.  Dermabond applied to both.  Breast binder placed.  All final counts found to be correct.  The patient was awoke extubated taken to recovery in satisfactory condition.

## 2018-08-13 ENCOUNTER — Encounter (HOSPITAL_BASED_OUTPATIENT_CLINIC_OR_DEPARTMENT_OTHER): Payer: Self-pay | Admitting: Surgery

## 2018-08-18 NOTE — Progress Notes (Signed)
Location of Breast Cancer: Left Breast  Histology per Pathology Report:  02/03/18 Diagnosis 1. Breast, left, needle core biopsy, 11:30 o'clock - INVASIVE DUCTAL CARCINOMA. - SEE COMMENT. 2. Breast, left, needle core biopsy, 11:00 o'clock - SCANT BENIGN FIBROADIPOSE TISSUE. - THERE IS NO EVIDENCE OF MALIGNANCY. 3. Lymph node, needle/core biopsy, left axilla - THERE IS NO EVIDENCE OF CARCINOMA IN 1 OF 1 LYMPH NODE (0/1). 4. Breast, right, needle core biopsy, posterior UIQ - FIBROADENOMA. - THERE IS NO EVIDENCE OF MALIGNANCY.  Receptor Status: ER(30%), PR (NEG), Her2-neu (POS), Ki-(30%)  08/12/18 Diagnosis 1. Breast, lumpectomy, Left - FOCAL MIXED INFLAMMATION, SEE COMMENT. - BIOPSY SITE WITH CALCIFICATION. - NO RESIDUAL CARCINOMA IDENTIFIED. - SEE ONCOLOGY TABLE. 2. Lymph node, sentinel, biopsy, Left axillary - ONE OF ONE LYMPH NODES NEGATIVE FOR CARCINOMA (0/1). 3. Lymph node, sentinel, biopsy - ONE OF ONE LYMPH NODES NEGATIVE FOR CARCINOMA (0/1). 4. Lymph node, sentinel, biopsy - ONE OF ONE LYMPH NODES NEGATIVE FOR CARCINOMA (0/1). 5. Lymph node, sentinel, biopsy - ONE OF ONE LYMPH NODES NEGATIVE FOR CARCINOMA (0/1).  Did patient present with symptoms or was this found on screening mammography?: She self-palpated the mass, and went for a mammogram.   Past/Anticipated interventions by surgeon, if any: 08/12/18 Procedure: Left breast seed localized lumpectomy with left axillary sentinel lymph node mapping using methylene blue dye deep left axillary sentinel node Surgeon: Erroll Luna, MD  Past/Anticipated interventions by medical oncology, if any:  07/08/18 Dr. Lindi Adie Current treatment: TCHP x3 cycles(Taxotere and carboplatin discontinued after 3 cycles), now on Herceptin Perjeta maintenance therapy Breast MRI 06/18/2018:Marked reduction in the abnormal enhancement in the upper inner left breast at the site of known cancer.  There is approximately 2.5 cm of residual linear non  mass enhancement at the site of known cancer. Patient has surgery scheduled in September. Continue with Herceptin Perjeta maintenance. I will see her every 6 weeks.  She will continue Herceptin Perjeta every 3 weeks.  Lymphedema issues, if any:  She denies. She is able to lift her left arm, but reports that it "pulls".  Pain issues, if any:  She reports pain to her upper left arm and 5/10. She is not taking anything for this pain except an occasional Tylenol.   SAFETY ISSUES:  Prior radiation? No  Pacemaker/ICD? No  Possible current pregnancy? No  Is the patient on methotrexate? No  Current Complaints / other details:    BP (!) 145/70 (BP Location: Right Arm, Patient Position: Sitting)   Pulse 71   Temp 98.2 F (36.8 C) (Oral)   Resp 18   Ht _0  (1.6 m)   Wt 146 lb 8 oz (66.5 kg)   SpO2 100%   BMI 25.95 kg/m    Wt Readings from Last 3 Encounters:  08/25/18 146 lb 8 oz (66.5 kg)  08/24/18 148 lb 9.6 oz (67.4 kg)  08/20/18 150 lb (68 kg)      [name], Stephani Police, RN 08/18/2018,10:47 AM

## 2018-08-19 ENCOUNTER — Ambulatory Visit: Payer: Medicare HMO | Admitting: Hematology and Oncology

## 2018-08-20 ENCOUNTER — Inpatient Hospital Stay: Payer: Medicare HMO | Attending: Hematology and Oncology

## 2018-08-20 ENCOUNTER — Inpatient Hospital Stay: Payer: Medicare HMO

## 2018-08-20 ENCOUNTER — Encounter: Payer: Self-pay | Admitting: *Deleted

## 2018-08-20 ENCOUNTER — Inpatient Hospital Stay (HOSPITAL_BASED_OUTPATIENT_CLINIC_OR_DEPARTMENT_OTHER): Payer: Medicare HMO | Admitting: Hematology and Oncology

## 2018-08-20 DIAGNOSIS — Z170 Estrogen receptor positive status [ER+]: Secondary | ICD-10-CM

## 2018-08-20 DIAGNOSIS — Z79899 Other long term (current) drug therapy: Secondary | ICD-10-CM | POA: Diagnosis not present

## 2018-08-20 DIAGNOSIS — C50212 Malignant neoplasm of upper-inner quadrant of left female breast: Secondary | ICD-10-CM

## 2018-08-20 DIAGNOSIS — Z95828 Presence of other vascular implants and grafts: Secondary | ICD-10-CM

## 2018-08-20 DIAGNOSIS — N6321 Unspecified lump in the left breast, upper outer quadrant: Secondary | ICD-10-CM | POA: Insufficient documentation

## 2018-08-20 DIAGNOSIS — Z5112 Encounter for antineoplastic immunotherapy: Secondary | ICD-10-CM

## 2018-08-20 LAB — CBC WITH DIFFERENTIAL (CANCER CENTER ONLY)
Basophils Absolute: 0 10*3/uL (ref 0.0–0.1)
Basophils Relative: 0 %
EOS PCT: 0 %
Eosinophils Absolute: 0 10*3/uL (ref 0.0–0.5)
HEMATOCRIT: 36.1 % (ref 34.8–46.6)
Hemoglobin: 11.7 g/dL (ref 11.6–15.9)
LYMPHS ABS: 1.3 10*3/uL (ref 0.9–3.3)
LYMPHS PCT: 10 %
MCH: 28.3 pg (ref 25.1–34.0)
MCHC: 32.5 g/dL (ref 31.5–36.0)
MCV: 87.3 fL (ref 79.5–101.0)
MONO ABS: 0.9 10*3/uL (ref 0.1–0.9)
Monocytes Relative: 7 %
NEUTROS ABS: 11.3 10*3/uL — AB (ref 1.5–6.5)
Neutrophils Relative %: 83 %
PLATELETS: 384 10*3/uL (ref 145–400)
RBC: 4.13 MIL/uL (ref 3.70–5.45)
RDW: 14 % (ref 11.2–14.5)
WBC Count: 13.6 10*3/uL — ABNORMAL HIGH (ref 3.9–10.3)

## 2018-08-20 LAB — CMP (CANCER CENTER ONLY)
ALBUMIN: 3.8 g/dL (ref 3.5–5.0)
ALT: 8 U/L (ref 0–44)
AST: 13 U/L — AB (ref 15–41)
Alkaline Phosphatase: 90 U/L (ref 38–126)
Anion gap: 10 (ref 5–15)
BUN: 16 mg/dL (ref 8–23)
CHLORIDE: 108 mmol/L (ref 98–111)
CO2: 22 mmol/L (ref 22–32)
Calcium: 9.3 mg/dL (ref 8.9–10.3)
Creatinine: 0.96 mg/dL (ref 0.44–1.00)
GFR, Est AFR Am: >60 mL/min (ref >60)
GFR, Estimated: 59 mL/min — ABNORMAL LOW (ref >60)
GLUCOSE: 102 mg/dL — AB (ref 70–99)
POTASSIUM: 4.4 mmol/L (ref 3.5–5.1)
SODIUM: 140 mmol/L (ref 135–145)
Total Bilirubin: 0.3 mg/dL (ref 0.3–1.2)
Total Protein: 7.1 g/dL (ref 6.5–8.1)

## 2018-08-20 MED ORDER — ACETAMINOPHEN 325 MG PO TABS
ORAL_TABLET | ORAL | Status: AC
  Filled 2018-08-20: qty 2

## 2018-08-20 MED ORDER — DIPHENHYDRAMINE HCL 25 MG PO CAPS
50.0000 mg | ORAL_CAPSULE | Freq: Once | ORAL | Status: AC
  Administered 2018-08-20: 50 mg via ORAL

## 2018-08-20 MED ORDER — SODIUM CHLORIDE 0.9 % IV SOLN
420.0000 mg | Freq: Once | INTRAVENOUS | Status: AC
  Administered 2018-08-20: 420 mg via INTRAVENOUS
  Filled 2018-08-20: qty 14

## 2018-08-20 MED ORDER — SODIUM CHLORIDE 0.9% FLUSH
10.0000 mL | INTRAVENOUS | Status: DC | PRN
  Administered 2018-08-20: 10 mL
  Filled 2018-08-20: qty 10

## 2018-08-20 MED ORDER — SODIUM CHLORIDE 0.9% FLUSH
10.0000 mL | Freq: Once | INTRAVENOUS | Status: AC
  Administered 2018-08-20: 10 mL
  Filled 2018-08-20: qty 10

## 2018-08-20 MED ORDER — ACETAMINOPHEN 325 MG PO TABS
650.0000 mg | ORAL_TABLET | Freq: Once | ORAL | Status: AC
  Administered 2018-08-20: 650 mg via ORAL

## 2018-08-20 MED ORDER — SODIUM CHLORIDE 0.9 % IV SOLN
Freq: Once | INTRAVENOUS | Status: AC
  Administered 2018-08-20: 12:00:00 via INTRAVENOUS
  Filled 2018-08-20: qty 250

## 2018-08-20 MED ORDER — DIPHENHYDRAMINE HCL 25 MG PO CAPS
ORAL_CAPSULE | ORAL | Status: AC
  Filled 2018-08-20: qty 2

## 2018-08-20 MED ORDER — TRASTUZUMAB CHEMO 150 MG IV SOLR
6.0000 mg/kg | Freq: Once | INTRAVENOUS | Status: AC
  Administered 2018-08-20: 399 mg via INTRAVENOUS
  Filled 2018-08-20: qty 19

## 2018-08-20 MED ORDER — HEPARIN SOD (PORK) LOCK FLUSH 100 UNIT/ML IV SOLN
500.0000 [IU] | Freq: Once | INTRAVENOUS | Status: AC | PRN
  Administered 2018-08-20: 500 [IU]
  Filled 2018-08-20: qty 5

## 2018-08-20 NOTE — Assessment & Plan Note (Signed)
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, copy #16.3, axillary lymph node biopsy benign concordant, T2 N0 stage II a clinical stage AJCC 8  Treatment plan: 1.Neoadjuvant chemotherapy with TCH Perjeta x 3 cycles (discontinued early due to toxicities of severe diarrhea nausea and vomiting) 02/26/2018 to 04/15/2018 followed by Herceptin Perjeta maintenance for 1 year 2.followed by breast conserving surgery 08/12/2018: Pathologic complete response 79fllowed by adjuvant radiation 4.Followed by adjuvant antiestrogen therapy with letrozole for 5-7 years ------------------------------------------------------------------------------------------------------------------------ 08/12/2018: Left lumpectomy: Pathologic complete response, 0/4 lymph nodes negative  I provided the patient the copy of the final pathology report and excellent news of pathologic complete response.  Recommendation: Patient will now see radiation oncology regarding adjuvant radiation which will then be followed by adjuvant antiestrogen therapy.  She will continue with Herceptin Perjeta maintenance for 1 year.  Return to clinic every 3 weeks for Herceptin and Perjeta in every 6 weeks for follow-up with me.

## 2018-08-20 NOTE — Patient Instructions (Signed)
[facility] Discharge Instructions for Patients Receiving Chemotherapy  Today you received the following chemotherapy agents Trastuzumab (Herceptin) & Pertuzumab (Perjeta).  To help prevent nausea and vomiting after your treatment, we encourage you to take your nausea medication as prescribed.   If you develop nausea and vomiting that is not controlled by your nausea medication, call the clinic.   BELOW ARE SYMPTOMS THAT SHOULD BE REPORTED IMMEDIATELY:  *FEVER GREATER THAN 100.5 F  *CHILLS WITH OR WITHOUT FEVER  NAUSEA AND VOMITING THAT IS NOT CONTROLLED WITH YOUR NAUSEA MEDICATION  *UNUSUAL SHORTNESS OF BREATH  *UNUSUAL BRUISING OR BLEEDING  TENDERNESS IN MOUTH AND THROAT WITH OR WITHOUT PRESENCE OF ULCERS  *URINARY PROBLEMS  *BOWEL PROBLEMS  UNUSUAL RASH Items with * indicate a potential emergency and should be followed up as soon as possible.  Feel free to call the clinic should you have any questions or concerns. The clinic phone number is [phone].  Please show the CHEMO ALERT CARD at check-in to the Emergency Department and triage nurse.   

## 2018-08-20 NOTE — Progress Notes (Signed)
Patient Care Team: [name], MD as PCP - General (Internal Medicine)  DIAGNOSIS:  Encounter Diagnosis  Name Primary?  . Malignant neoplasm of upper-inner quadrant of left breast in female, estrogen receptor positive ([place])     SUMMARY OF ONCOLOGIC HISTORY:   Malignant neoplasm of upper-inner quadrant of left breast in female, estrogen receptor positive ([place])   02/03/2018 Initial Diagnosis    Left breast palpable lump at 1130 position 2.3 cm, at 11 o'clock position 6 mm which was benign; biopsy revealed IDC grade 3 ER 30%, PR 0%, Ki-67 30%, HER-2 positive ratio 5.62, copy #16.3, axillary lymph node biopsy benign concordant, T2 N0 stage II a clinical stage AJCC 8    02/18/2018 Breast MRI    Solitary mass in left breast upper outer quadrant 2.5 cm.  No additional enhancement in the right breast.  No adenopathy    02/26/2018 - 04/15/2018 Neo-Adjuvant Chemotherapy    TCH Perjeta X 3 cycles (stopped early due to toxicities) followed by Herceptin Perjeta maintenance    06/18/2018 Breast MRI    Marked reduction in the abnormal enhancement in the upper inner left breast at the site of known cancer.  There is approximately 2.5 cm of residual linear non mass enhancement at the site of known cancer.    08/12/2018 Surgery    Left lumpectomy: No residual cancer identified 0/4 lymph nodes negative, complete pathologic response     CHIEF COMPLIANT: Follow-up on Herceptin and Perjeta, also follow-up on recent surgery  INTERVAL HISTORY: [name] is a 72 year old with above-mentioned history of HER-2 positive breast cancer received neoadjuvant chemotherapy.  She currently received 3 cycles and we discontinued it.  She underwent lumpectomy and had a complete pathologic response.  She is ecstatic to hear this result.  She is also here to receive and continue the maintenance Herceptin and Perjeta.  REVIEW OF SYSTEMS:   Constitutional: Denies fevers, chills or abnormal weight loss Eyes: Denies  blurriness of vision Ears, nose, mouth, throat, and face: Denies mucositis or sore throat Respiratory: Denies cough, dyspnea or wheezes Cardiovascular: Denies palpitation, chest discomfort Gastrointestinal:  Denies nausea, heartburn or change in bowel habits Skin: Denies abnormal skin rashes Lymphatics: Denies new lymphadenopathy or easy bruising Neurological:Denies numbness, tingling or new weaknesses Behavioral/Psych: Mood is stable, no new changes  Extremities: No lower extremity edema Breast: Recent left lumpectomy All other systems were reviewed with the patient and are negative.  I have reviewed the past medical history, past surgical history, social history and family history with the patient and they are unchanged from previous note.  ALLERGIES:  is allergic to codeine and demerol [meperidine].  MEDICATIONS:  Current Outpatient Medications  Medication Sig Dispense Refill  . amLODipine (NORVASC) 10 MG tablet Take 10 mg by mouth daily.     Marland Kitchen buPROPion (WELLBUTRIN SR) 150 MG 12 hr tablet Start one week before quit date. Take 1 tab daily x 3 days, then 1 tab BID thereafter. 60 tablet 2  . clotrimazole-betamethasone (LOTRISONE) cream Apply 1 application topically 2 (two) times daily. 30 g 0  . cyclobenzaprine (FLEXERIL) 5 MG tablet TAKE 1 TABLET (5 MG TOTAL) BY MOUTH 3 TIMES DAILY AS NEEDED FOR MUSCLE SPASMS. 30 tablet 0  . dexamethasone (DECADRON) 4 MG tablet Take 1 tablet (4 mg total) by mouth daily. Take 1 tablet day before chemo and take 1 tablet day after chemo 12 tablet 0  . diazepam (VALIUM) 5 MG tablet Take 1 tablet (5 mg total) by mouth  every 6 (six) hours as needed for anxiety. Take 1 tablet 30 minutes to one hour prior to procedure. 2 tablet 0  . diphenoxylate-atropine (LOMOTIL) 2.5-0.025 MG tablet Take 1 tablet by mouth 4 (four) times daily as needed for diarrhea or loose stools. 60 tablet 3  . hydrochlorothiazide (HYDRODIURIL) 12.5 MG tablet Take 12.5 mg by mouth daily.       . hydrOXYzine (ATARAX/VISTARIL) 50 MG tablet Take 50 mg by mouth 3 (three) times daily as needed for anxiety.     Marland Kitchen ibuprofen (ADVIL,MOTRIN) 800 MG tablet Take 1 tablet (800 mg total) by mouth every 8 (eight) hours as needed. 30 tablet 0  . ibuprofen (ADVIL,MOTRIN) 800 MG tablet Take 1 tablet (800 mg total) by mouth every 8 (eight) hours as needed. 30 tablet 0  . latanoprost (XALATAN) 0.005 % ophthalmic solution Place 1 drop into both eyes at bedtime.     . lidocaine-prilocaine (EMLA) cream Apply to affected area once 30 g 3  . LORazepam (ATIVAN) 0.5 MG tablet DISSOLVE 1 TABLET UNDER THE TONGUE EVERY 6 HOURS AS NEEDED FOR NAUSEA OR VOMITING 30 tablet 0  . nicotine (NICODERM CQ - DOSED IN MG/24 HOURS) 21 mg/24hr patch Place 1 patch (21 mg total) onto the skin daily. Apply 21 mg patch daily x 6 wk, then 8m patch daily x 2 wk, then 7 mg patch daily x 2 wk 14 patch 2  . omeprazole (PRILOSEC) 20 MG capsule Take 20 mg by mouth 2 (two) times daily before a meal.     . ondansetron (ZOFRAN) 8 MG tablet Take 1 tablet (8 mg total) by mouth 2 (two) times daily as needed for refractory nausea / vomiting. 30 tablet 1  . oxyCODONE (OXY IR/ROXICODONE) 5 MG immediate release tablet Take 1 tablet (5 mg total) by mouth every 6 (six) hours as needed for severe pain. 10 tablet 0  . pramipexole (MIRAPEX) 0.25 MG tablet Take 0.25-1 mg by mouth at bedtime.     . prochlorperazine (COMPAZINE) 10 MG tablet Take 1 tablet (10 mg total) by mouth every 6 (six) hours as needed (Nausea or vomiting). 30 tablet 1   No current facility-administered medications for this visit.    Facility-Administered Medications Ordered in Other Visits  Medication Dose Route Frequency Provider Last Rate Last Dose  . heparin lock flush 100 unit/mL  500 Units Intracatheter Once PRN GNicholas Lose MD      . pertuzumab (PERJETA) 420 mg in sodium chloride 0.9 % 250 mL chemo infusion  420 mg Intravenous Once GNicholas Lose MD      . sodium chloride  flush (NS) 0.9 % injection 10 mL  10 mL Intracatheter PRN GNicholas Lose MD      . trastuzumab (HERCEPTIN) 399 mg in sodium chloride 0.9 % 250 mL chemo infusion  6 mg/kg (Treatment Plan Recorded) Intravenous Once GNicholas Lose MD        PHYSICAL EXAMINATION: ECOG PERFORMANCE STATUS: 1 - Symptomatic but completely ambulatory  Vitals:   08/20/18 1036  BP: 139/65  Pulse: 63  Resp: 17  Temp: 97.7 F (36.5 C)  SpO2: 100%   Filed Weights   08/20/18 1036  Weight: 150 lb (68 kg)    GENERAL:alert, no distress and comfortable SKIN: skin color, texture, turgor are normal, no rashes or significant lesions EYES: normal, Conjunctiva are pink and non-injected, sclera clear OROPHARYNX:no exudate, no erythema and lips, buccal mucosa, and tongue normal  NECK: supple, thyroid normal size, non-tender, without nodularity LYMPH:  no palpable lymphadenopathy in the cervical, axillary or inguinal LUNGS: clear to auscultation and percussion with normal breathing effort HEART: regular rate & rhythm and no murmurs and no lower extremity edema ABDOMEN:abdomen soft, non-tender and normal bowel sounds MUSCULOSKELETAL:no cyanosis of digits and no clubbing  NEURO: alert & oriented x 3 with fluent speech, no focal motor/sensory deficits EXTREMITIES: No lower extremity edema    LABORATORY DATA:  I have reviewed the data as listed CMP Latest Ref Rng & Units 08/20/2018 07/29/2018 07/08/2018  Glucose 70 - 99 mg/dL 102(H) 83 107(H)  BUN 8 - 23 mg/dL 16 16 18   Creatinine 0.44 - 1.00 mg/dL 0.96 0.89 0.94  Sodium 135 - 145 mmol/L 140 141 140  Potassium 3.5 - 5.1 mmol/L 4.4 4.4 4.2  Chloride 98 - 111 mmol/L 108 108 110  CO2 22 - 32 mmol/L 22 25 18(L)  Calcium 8.9 - 10.3 mg/dL 9.3 9.5 9.3  Total Protein 6.5 - 8.1 g/dL 7.1 7.3 7.3  Total Bilirubin 0.3 - 1.2 mg/dL 0.3 0.3 0.3  Alkaline Phos 38 - 126 U/L 90 86 80  AST 15 - 41 U/L 13(L) 18 13(L)  ALT 0 - 44 U/L 8 9 11     Lab Results  Component Value Date   WBC  13.6 (H) 08/20/2018   HGB 11.7 08/20/2018   HCT 36.1 08/20/2018   MCV 87.3 08/20/2018   PLT 384 08/20/2018   NEUTROABS 11.3 (H) 08/20/2018    ASSESSMENT & PLAN:  Malignant neoplasm of upper-inner quadrant of left breast in female, estrogen receptor positive ([facility]) 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, copy #16.3, axillary lymph node biopsy benign concordant, T2 N0 stage II a clinical stage AJCC 8  Treatment plan: 1.Neoadjuvant chemotherapy with TCH Perjeta x 3 cycles (discontinued early due to toxicities of severe diarrhea nausea and vomiting) 02/26/2018 to 04/15/2018 followed by Herceptin Perjeta maintenance for 1 year 2.followed by breast conserving surgery 08/12/2018: Pathologic complete response 31fllowed by adjuvant radiation 4.Followed by adjuvant antiestrogen therapy with letrozole for 5-7 years ------------------------------------------------------------------------------------------------------------------------ 08/12/2018: Left lumpectomy: Pathologic complete response, 0/4 lymph nodes negative  I provided the patient the copy of the final pathology report and excellent news of pathologic complete response.  Recommendation: Patient will now see radiation oncology regarding adjuvant radiation which will then be followed by adjuvant antiestrogen therapy.  She will continue with Herceptin Perjeta maintenance for 1 year.  Return to clinic every 3 weeks for Herceptin and Perjeta in every 6 weeks for follow-up with me.  No orders of the defined types were placed in this encounter.  The patient has a good understanding of the overall plan. she agrees with it. she will call with any problems that may develop before the next visit here.   VHarriette Ohara MD 08/20/18

## 2018-08-24 ENCOUNTER — Other Ambulatory Visit: Payer: Self-pay | Admitting: Hematology and Oncology

## 2018-08-24 ENCOUNTER — Ambulatory Visit (HOSPITAL_BASED_OUTPATIENT_CLINIC_OR_DEPARTMENT_OTHER)
Admission: RE | Admit: 2018-08-24 | Discharge: 2018-08-24 | Disposition: A | Discharge status: Home / Self Care | Payer: Medicare HMO | Source: Ambulatory Visit | Attending: Cardiology | Admitting: Cardiology

## 2018-08-24 ENCOUNTER — Ambulatory Visit (HOSPITAL_COMMUNITY)
Admission: RE | Admit: 2018-08-24 | Discharge: 2018-08-24 | Disposition: A | Discharge status: Home / Self Care | Payer: Medicare HMO | Source: Ambulatory Visit | Attending: Internal Medicine | Admitting: Internal Medicine

## 2018-08-24 VITALS — BP 140/78 | HR 60 | Wt 148.6 lb

## 2018-08-24 DIAGNOSIS — C50912 Malignant neoplasm of unspecified site of left female breast: Secondary | ICD-10-CM | POA: Insufficient documentation

## 2018-08-24 DIAGNOSIS — R51 Headache: Secondary | ICD-10-CM

## 2018-08-24 DIAGNOSIS — C50212 Malignant neoplasm of upper-inner quadrant of left female breast: Secondary | ICD-10-CM

## 2018-08-24 DIAGNOSIS — Z170 Estrogen receptor positive status [ER+]: Secondary | ICD-10-CM

## 2018-08-24 DIAGNOSIS — R519 Headache, unspecified: Secondary | ICD-10-CM

## 2018-08-24 DIAGNOSIS — F17200 Nicotine dependence, unspecified, uncomplicated: Secondary | ICD-10-CM | POA: Diagnosis not present

## 2018-08-24 DIAGNOSIS — I10 Essential (primary) hypertension: Secondary | ICD-10-CM | POA: Insufficient documentation

## 2018-08-24 DIAGNOSIS — M542 Cervicalgia: Secondary | ICD-10-CM

## 2018-08-24 DIAGNOSIS — M62838 Other muscle spasm: Secondary | ICD-10-CM

## 2018-08-24 DIAGNOSIS — F17210 Nicotine dependence, cigarettes, uncomplicated: Secondary | ICD-10-CM | POA: Insufficient documentation

## 2018-08-24 DIAGNOSIS — Z79899 Other long term (current) drug therapy: Secondary | ICD-10-CM | POA: Diagnosis not present

## 2018-08-24 MED ORDER — BUPROPION HCL ER (SR) 150 MG PO TB12: ORAL_TABLET | ORAL | 3 refills | Status: DC

## 2018-08-24 MED FILL — DEXAMETHASONE 4 MG TABLET: 4 | 12 days supply | Qty: 12 | Fill #0

## 2018-08-24 MED FILL — BUPROPION SR 150 MG TABLET: 150 | 30 days supply | Qty: 57 | Fill #0

## 2018-08-24 MED FILL — CYCLOBENZAPRINE HCL 5 MG TA: 5 | 10 days supply | Qty: 30 | Fill #0

## 2018-08-24 NOTE — Patient Instructions (Signed)
START taking Wellbutrin 150 mg Once Daily for 3 days, then increase to twice daily.  Echocardiogram and follow up in 3 months.

## 2018-08-24 NOTE — Progress Notes (Signed)
  Echocardiogram 2D Echocardiogram has been performed.  [name] L Androw 08/24/2018, 11:55 AM

## 2018-08-25 ENCOUNTER — Other Ambulatory Visit: Payer: Self-pay

## 2018-08-25 ENCOUNTER — Ambulatory Visit
Admission: RE | Admit: 2018-08-25 | Discharge: 2018-08-25 | Disposition: A | Discharge status: Home / Self Care | Payer: Medicare HMO | Source: Ambulatory Visit | Attending: Radiation Oncology | Admitting: Radiation Oncology

## 2018-08-25 ENCOUNTER — Encounter: Payer: Self-pay | Admitting: Radiation Oncology

## 2018-08-25 VITALS — BP 145/70 | HR 71 | Temp 98.2°F | Resp 18 | Ht 63.0 in | Wt 146.5 lb

## 2018-08-25 DIAGNOSIS — Z170 Estrogen receptor positive status [ER+]: Secondary | ICD-10-CM | POA: Diagnosis not present

## 2018-08-25 DIAGNOSIS — Z720 Tobacco use: Secondary | ICD-10-CM | POA: Insufficient documentation

## 2018-08-25 DIAGNOSIS — C50212 Malignant neoplasm of upper-inner quadrant of left female breast: Secondary | ICD-10-CM | POA: Insufficient documentation

## 2018-08-25 DIAGNOSIS — Z885 Allergy status to narcotic agent status: Secondary | ICD-10-CM | POA: Insufficient documentation

## 2018-08-25 DIAGNOSIS — Z79899 Other long term (current) drug therapy: Secondary | ICD-10-CM | POA: Insufficient documentation

## 2018-08-25 NOTE — Progress Notes (Signed)
Radiation Oncology         [phone] ________________________________  Name: MASIE BERMINGHAM MRN: [id number]  Date: 08/25/2018  DOB: 23-Jun-1946  Follow-Up Visit Note  Outpatient  CC: [name], MD  Nicholas Lose, MD  Diagnosis:      ICD-10-CM   1. Malignant neoplasm of upper-inner quadrant of left breast in female, estrogen receptor positive ([place]) C50.212 Ambulatory referral to Social Work   Z17.0    Cancer Staging Malignant neoplasm of upper-inner quadrant of left breast in female, estrogen receptor positive ([place]) Staging form: Breast, AJCC 8th Edition - Clinical stage from 02/10/2018: Stage IIA (cT2, cN0, cM0, G3, ER: Positive, PR: Negative, HER2: Positive) - Unsigned Staging comments: Staged at breast conference on 3.13.19  ypT0 ypN0  CHIEF COMPLAINT: Here to discuss management of left breast cancer  Narrative:  The patient returns today for follow-up.     She completed an abbreviated course of neoadjuvant chemotherapy but this was stopped early due to toxicity.  Fortunately she had a complete response pathologically per lumpectomy and SLN biopsy done 08-12-18.  All 4 nodes were negative, no residual cancer in breast. She continues Herceptin, Perjeta, currently.  Symptomatically, the patient reports: left arm pain since surgery        ALLERGIES:  is allergic to codeine and demerol [meperidine].  Meds: Current Outpatient Medications  Medication Sig Dispense Refill  . amLODipine (NORVASC) 10 MG tablet Take 10 mg by mouth daily.     . clotrimazole-betamethasone (LOTRISONE) cream Apply 1 application topically 2 (two) times daily. 30 g 0  . cyclobenzaprine (FLEXERIL) 5 MG tablet TAKE 1 TABLET (5 MG TOTAL) BY MOUTH 3 TIMES DAILY AS NEEDED FOR MUSCLE SPASMS. 30 tablet 0  . dexamethasone (DECADRON) 4 MG tablet TAKE 1 TABLET (4 MG TOTAL) BY MOUTH DAILY THE DAY BEFORE CHEMO AND TAKE 1 TABLET THE DAY AFTER CHEMO 12 tablet 0  . hydrochlorothiazide (HYDRODIURIL) 12.5 MG tablet  Take 12.5 mg by mouth daily.     . hydrOXYzine (ATARAX/VISTARIL) 50 MG tablet Take 50 mg by mouth 3 (three) times daily as needed for anxiety.     Marland Kitchen latanoprost (XALATAN) 0.005 % ophthalmic solution Place 1 drop into both eyes at bedtime.     . lidocaine-prilocaine (EMLA) cream Apply to affected area once 30 g 3  . omeprazole (PRILOSEC) 20 MG capsule Take 20 mg by mouth 2 (two) times daily before a meal.     . buPROPion (WELLBUTRIN SR) 150 MG 12 hr tablet Take 1 tablet (150 mg total) by mouth daily for 3 days, THEN 1 tablet (150 mg total) 2 (two) times daily. (Patient not taking: Reported on 08/25/2018) 63 tablet 3  . diphenoxylate-atropine (LOMOTIL) 2.5-0.025 MG tablet Take 1 tablet by mouth 4 (four) times daily as needed for diarrhea or loose stools. (Patient not taking: Reported on 08/25/2018) 60 tablet 3  . ibuprofen (ADVIL,MOTRIN) 800 MG tablet Take 1 tablet (800 mg total) by mouth every 8 (eight) hours as needed. (Patient not taking: Reported on 08/25/2018) 30 tablet 0  . LORazepam (ATIVAN) 0.5 MG tablet DISSOLVE 1 TABLET UNDER THE TONGUE EVERY 6 HOURS AS NEEDED FOR NAUSEA OR VOMITING (Patient not taking: Reported on 08/25/2018) 30 tablet 0  . nicotine (NICODERM CQ - DOSED IN MG/24 HOURS) 21 mg/24hr patch Place 1 patch (21 mg total) onto the skin daily. Apply 21 mg patch daily x 6 wk, then 70m patch daily x 2 wk, then 7 mg patch daily x 2 wk (  Patient not taking: Reported on 08/25/2018) 14 patch 2  . ondansetron (ZOFRAN) 8 MG tablet Take 1 tablet (8 mg total) by mouth 2 (two) times daily as needed for refractory nausea / vomiting. (Patient not taking: Reported on 08/25/2018) 30 tablet 1  . oxyCODONE (OXY IR/ROXICODONE) 5 MG immediate release tablet Take 1 tablet (5 mg total) by mouth every 6 (six) hours as needed for severe pain. (Patient not taking: Reported on 08/25/2018) 10 tablet 0  . pramipexole (MIRAPEX) 0.25 MG tablet Take 0.25-1 mg by mouth at bedtime.     . prochlorperazine (COMPAZINE) 10 MG  tablet Take 1 tablet (10 mg total) by mouth every 6 (six) hours as needed (Nausea or vomiting). (Patient not taking: Reported on 08/25/2018) 30 tablet 1   No current facility-administered medications for this encounter.     Physical Findings:  height is 5' 3"  (1.6 m) and weight is 146 lb 8 oz (66.5 kg). Her oral temperature is 98.2 F (36.8 C). Her blood pressure is 145/70 (abnormal) and her pulse is 71. Her respiration is 18 and oxygen saturation is 100%. .     General: Alert and oriented, in no acute distress Psychiatric: Judgment and insight are intact. Affect is appropriate. Breast exam reveals healing scars in lumpectomy and axillary sites, left. Some erythema/moisture at axillary scar. Left breast swelling but no excessive warmth or redness.  Lab Findings: Lab Results  Component Value Date   WBC 13.6 (H) 08/20/2018   HGB 11.7 08/20/2018   HCT 36.1 08/20/2018   MCV 87.3 08/20/2018   PLT 384 08/20/2018       Radiographic Findings: Nm Sentinel Node Inj-no Rpt (breast)  Result Date: 08/12/2018 Sulfur colloid was injected by the nuclear medicine technologist for melanoma sentinel node.   Mm Breast Surgical Specimen  Result Date: 08/12/2018 CLINICAL DATA:  Radioactive seed localization was performed on August 10, 2018 of the left breast prior to lumpectomy. EXAM: SPECIMEN RADIOGRAPH OF THE LEFT BREAST COMPARISON:  Previous exam(s). FINDINGS: Status post excision of the left breast. The radioactive seed and biopsy marker clip are present, completely intact, and were marked for pathology. The ribbon shaped biopsy clip which was targeted at the time localization is immediately adjacent to the radioactive seed. Also within the specimen are a coil shaped biopsy clip and wing shaped biopsy clip. IMPRESSION: Specimen radiograph of the left breast. Electronically Signed   By: Curlene Dolphin M.D.   On: 08/12/2018 08:15   Mm Lt Radioactive Seed Loc Mammo Guide  Result Date:  08/10/2018 CLINICAL DATA:  Pre lumpectomy localization of recently diagnosed grade 3 invasive ductal carcinoma in the 11:30 o'clock position of the left breast. EXAM: MAMMOGRAPHIC GUIDED RADIOACTIVE SEED LOCALIZATION OF THE LEFT BREAST COMPARISON:  Previous exam(s). FINDINGS: Patient presents for radioactive seed localization prior to left lumpectomy. I met with the patient and we discussed the procedure of seed localization including benefits and alternatives. We discussed the high likelihood of a successful procedure. We discussed the risks of the procedure including infection, bleeding, tissue injury and further surgery. We discussed the low dose of radioactivity involved in the procedure. Informed, written consent was given. The usual time-out protocol was performed immediately prior to the procedure. Using mammographic guidance, sterile technique, 1% lidocaine and an I-125 radioactive seed, the recently placed ribbon shaped biopsy marker clip in the 11:30 o'clock position of the left breast was localized using a cephalad approach. The follow-up mammogram images confirm the seed in the expected location and were  marked for Dr. Brantley Stage. Follow-up survey of the patient confirms presence of the radioactive seed. Order number of I-125 seed:  [id number]. Total activity:  0.252 mCi reference Date: 07/16/2018 The patient tolerated the procedure well and was released from the [facility]. She was given instructions regarding seed removal. IMPRESSION: Radioactive seed localization left breast. No apparent complications. Electronically Signed   By: Claudie Revering M.D.   On: 08/10/2018 14:39    Impression/Plan: We discussed adjuvant radiotherapy today.  I recommend radiotherapy to the left breast in order to reduce risk of locoregional recurrence by 2/3.  The risks, benefits and side effects of this treatment were discussed in detail.  She understands that radiotherapy is associated with skin irritation and fatigue in  the acute setting. Late effects can include cosmetic changes and rare injury to internal organs.   She is enthusiastic about proceeding with treatment. A consent form has been signed and placed in her chart.  A total of 3 medically necessary complex treatment devices will be fabricated and supervised by me: 2 fields with MLCs for custom blocks to protect heart, and lungs;  and, a Vac-lok. MORE COMPLEX DEVICES MAY BE MADE IN DOSIMETRY FOR FIELD IN FIELD BEAMS FOR DOSE HOMOGENEITY.  I have requested : 3D Simulation which is medically necessary to give adequate dose to at risk tissues while sparing lungs and heart.  I have requested a DVH of the following structures: lungs, heart, left lumpectomy cavity.    The patient will receive 42.56 Gy in 16 fractions to the left breast with 2 fields.  This will not be followed by a boost.  Simulation ordered for early October  I asked the patient today about tobacco use. The patient uses tobacco.  I advised the patient to quit. Services were offered by me today including outpatient counseling and pharmacotherapy. I assessed for the willingness to attempt to quit and provided encouragement and demonstrated willingness to make referrals and/or prescriptions to help the patient attempt to quit. The patient has follow-up with the oncologic team to touch base on their tobacco use and /or cessation efforts.  Over 3 minutes were spent on this issue. She plans to quit Oct 1st using a prescription from her cardiologist (Wellbutrin).  Advised patient to put neosporin on axillary scar, provided generic samples today.  I spent 15 minutes minutes face to face with the patient and more than 50% of that time was spent in counseling and/or coordination of care. _____________________________________   Eppie Gibson, MD

## 2018-08-25 NOTE — Progress Notes (Signed)
Oncology: Dr. Lindi Adie  72 yo with history of HTN and smoking was referred by Dr. Lindi Adie for cardio-oncology evaluation.  Left breast cancer diagnosed in 3/19.  ER+/PR-/HER2+.  She had 3 cycles of TCH-Perjeta but could not tolerate it due to side effects.  She is now on Herceptin-Perjeta to continue for a year.  She had lumpectomy in 9/19 and will need radiation.   She is doing well at this point, no chest pain.  No exertional dyspnea.  She is still smoking about 1/2 ppd.   PMH: 1. HTN 2. Active smoker 3. Breast cancer: Left breast cancer diagnosed in 3/19.  ER+/PR-/HER2+.  She had 3 cycles of TCH-Perjeta but could not tolerate it due to side effects.  She is now on Herceptin-Perjeta to continue for a year.  Lumpectomy 9/19.  - Echo (3/19): EF 60-65%, mild LVH. - Echo (6/19): EF 55-60%, GLS -19%, normal RV size and systolic function.  - Echo (9/19): EF 55-60%, GLS -21.4%, normal RV size and systolic function.   Social History   Socioeconomic History  . Marital status: Married    Spouse name: Not on file  . Number of children: Not on file  . Years of education: Not on file  . Highest education level: Not on file  Occupational History  . Not on file  Social Needs  . Financial resource strain: Not on file  . Food insecurity:    Worry: Not on file    Inability: Not on file  . Transportation needs:    Medical: No    Non-medical: No  Tobacco Use  . Smoking status: Current Every Day Smoker    Packs/day: 1.00    Years: 47.00    Pack years: 47.00    Types: Cigarettes  . Smokeless tobacco: Never Used  . Tobacco comment: she is trying to quit. She is smoking about a 1/2 pack daily- 08/25/18  Substance and Sexual Activity  . Alcohol use: No  . Drug use: No  . Sexual activity: Not on file  Lifestyle  . Physical activity:    Days per week: Not on file    Minutes per session: Not on file  . Stress: Not on file  Relationships  . Social connections:    Talks on phone: Not on file   Gets together: Not on file    Attends religious service: Not on file    Active member of club or organization: Not on file    Attends meetings of clubs or organizations: Not on file    Relationship status: Not on file  . Intimate partner violence:    Fear of current or ex partner: No    Emotionally abused: No    Physically abused: No    Forced sexual activity: No  Other Topics Concern  . Not on file  Social History Narrative  . Not on file   Family History  Problem Relation Age of Onset  . Liver cancer Mother        deceased 66  . Lung cancer Father 52       deceased 69; smoker  . Stomach cancer Maternal Grandmother        deceased 49  . Cancer Paternal Uncle        deceased 28; unk. type  . Breast cancer Maternal Aunt        dx 82s; deceased 10s  . Breast cancer Maternal Aunt        dx 63s; deceased 26s  . Breast  cancer Cousin        daughter of mat aunt with breast ca   ROS: All systems reviewed and negative except as per HPI.   Current Outpatient Medications  Medication Sig Dispense Refill  . amLODipine (NORVASC) 10 MG tablet Take 10 mg by mouth daily.     . clotrimazole-betamethasone (LOTRISONE) cream Apply 1 application topically 2 (two) times daily. 30 g 0  . diphenoxylate-atropine (LOMOTIL) 2.5-0.025 MG tablet Take 1 tablet by mouth 4 (four) times daily as needed for diarrhea or loose stools. (Patient not taking: Reported on 08/25/2018) 60 tablet 3  . hydrochlorothiazide (HYDRODIURIL) 12.5 MG tablet Take 12.5 mg by mouth daily.     . hydrOXYzine (ATARAX/VISTARIL) 50 MG tablet Take 50 mg by mouth 3 (three) times daily as needed for anxiety.     Marland Kitchen ibuprofen (ADVIL,MOTRIN) 800 MG tablet Take 1 tablet (800 mg total) by mouth every 8 (eight) hours as needed. (Patient not taking: Reported on 08/25/2018) 30 tablet 0  . latanoprost (XALATAN) 0.005 % ophthalmic solution Place 1 drop into both eyes at bedtime.     . lidocaine-prilocaine (EMLA) cream Apply to affected area once  30 g 3  . LORazepam (ATIVAN) 0.5 MG tablet DISSOLVE 1 TABLET UNDER THE TONGUE EVERY 6 HOURS AS NEEDED FOR NAUSEA OR VOMITING (Patient not taking: Reported on 08/25/2018) 30 tablet 0  . nicotine (NICODERM CQ - DOSED IN MG/24 HOURS) 21 mg/24hr patch Place 1 patch (21 mg total) onto the skin daily. Apply 21 mg patch daily x 6 wk, then 81m patch daily x 2 wk, then 7 mg patch daily x 2 wk (Patient not taking: Reported on 08/25/2018) 14 patch 2  . omeprazole (PRILOSEC) 20 MG capsule Take 20 mg by mouth 2 (two) times daily before a meal.     . ondansetron (ZOFRAN) 8 MG tablet Take 1 tablet (8 mg total) by mouth 2 (two) times daily as needed for refractory nausea / vomiting. (Patient not taking: Reported on 08/25/2018) 30 tablet 1  . oxyCODONE (OXY IR/ROXICODONE) 5 MG immediate release tablet Take 1 tablet (5 mg total) by mouth every 6 (six) hours as needed for severe pain. (Patient not taking: Reported on 08/25/2018) 10 tablet 0  . pramipexole (MIRAPEX) 0.25 MG tablet Take 0.25-1 mg by mouth at bedtime.     . prochlorperazine (COMPAZINE) 10 MG tablet Take 1 tablet (10 mg total) by mouth every 6 (six) hours as needed (Nausea or vomiting). (Patient not taking: Reported on 08/25/2018) 30 tablet 1  . buPROPion (WELLBUTRIN SR) 150 MG 12 hr tablet Take 1 tablet (150 mg total) by mouth daily for 3 days, THEN 1 tablet (150 mg total) 2 (two) times daily. (Patient not taking: Reported on 08/25/2018) 63 tablet 3  . cyclobenzaprine (FLEXERIL) 5 MG tablet TAKE 1 TABLET (5 MG TOTAL) BY MOUTH 3 TIMES DAILY AS NEEDED FOR MUSCLE SPASMS. 30 tablet 0  . dexamethasone (DECADRON) 4 MG tablet TAKE 1 TABLET (4 MG TOTAL) BY MOUTH DAILY THE DAY BEFORE CHEMO AND TAKE 1 TABLET THE DAY AFTER CHEMO 12 tablet 0   No current facility-administered medications for this encounter.    General: NAD Neck: No JVD, no thyromegaly or thyroid nodule.  Lungs: Clear to auscultation bilaterally with normal respiratory effort. CV: Nondisplaced PMI.  Heart  regular S1/S2, no S3/S4, no murmur.  No peripheral edema.  No carotid bruit.  Normal pedal pulses.  Abdomen: Soft, nontender, no hepatosplenomegaly, no distention.  Skin: Intact without lesions  or rashes.  Neurologic: Alert and oriented x 3.  Psych: Normal affect. Extremities: No clubbing or cyanosis.  HEENT: Normal.   Assessment/Plan:  1. Smoking: I strongly encouraged her to quit. I will give her a prescription for wellbutrin.  2. HTN: BP borderline elevated today.  Will monitor, continue current meds.  3. Breast cancer: She will be getting Herceptin for a year, to 3/19.  I reviewed today's echo: Normal LV systolic function and strain pattern.  - Repeat echo in 3 months with office followup.   Loralie Champagne 08/25/2018

## 2018-08-27 ENCOUNTER — Encounter: Payer: Self-pay | Admitting: General Practice

## 2018-08-27 ENCOUNTER — Telehealth: Payer: Self-pay | Admitting: General Practice

## 2018-08-27 NOTE — Telephone Encounter (Signed)
[facility] CSW Progress Notes  Patient returned call, doing well.  Recovering well from surgery, will begin radiation in October.  No needs at this time.  Support Center services introduced, patient encouraged to access as needed.  Edwyna Shell, LCSW Clinical Social Worker Phone:  [phone]

## 2018-08-27 NOTE — Progress Notes (Signed)
[facility] Psychosocial Distress Screening Clinical Social Work  Clinical Social Work was referred by distress screening protocol.  The patient scored a 5 on the Psychosocial Distress Thermometer which indicates moderate distress. Clinical Social Worker contacted patient by phone to assess for distress and other psychosocial needs. Patient not home, CSW spoke w husband "she is doing very well!"  Brief description of [facility] and services provided.  Left my contact information so patient can return call if desired.    [facility] DISTRESS SCREENING 08/25/2018  Screening Type Initial Screening  Distress experienced in past week (1-10) 5  Family Problem type   Emotional problem type Nervousness/Anxiety  Information Concerns Type   Physical Problem type Pain;Sleep/insomnia    Clinical Social Worker follow up needed: No.  If yes, follow up plan:  Beverely Pace, [place], LCSW Clinical Social Worker Phone:  [phone]

## 2018-08-30 NOTE — Addendum Note (Signed)
Encounter addended by: Maurianna Benard, Stephani Police, RN on: 08/30/2018 4:00 PM  Actions taken: Charge Capture section accepted

## 2018-08-31 ENCOUNTER — Telehealth: Payer: Self-pay

## 2018-08-31 NOTE — Telephone Encounter (Signed)
Returned patient's call.  No answer, no option to leave voicemail.

## 2018-09-08 ENCOUNTER — Ambulatory Visit
Admission: RE | Admit: 2018-09-08 | Discharge: 2018-09-08 | Disposition: A | Discharge status: Home / Self Care | Payer: Medicare HMO | Source: Ambulatory Visit | Attending: Radiation Oncology | Admitting: Radiation Oncology

## 2018-09-08 DIAGNOSIS — C50212 Malignant neoplasm of upper-inner quadrant of left female breast: Secondary | ICD-10-CM | POA: Insufficient documentation

## 2018-09-08 DIAGNOSIS — Z510 Encounter for antineoplastic radiation therapy: Secondary | ICD-10-CM | POA: Insufficient documentation

## 2018-09-08 DIAGNOSIS — Z170 Estrogen receptor positive status [ER+]: Secondary | ICD-10-CM | POA: Diagnosis not present

## 2018-09-09 ENCOUNTER — Inpatient Hospital Stay: Payer: Medicare HMO | Attending: Hematology and Oncology

## 2018-09-09 VITALS — BP 135/67 | HR 68 | Temp 98.1°F | Resp 18

## 2018-09-09 DIAGNOSIS — Z7982 Long term (current) use of aspirin: Secondary | ICD-10-CM | POA: Diagnosis not present

## 2018-09-09 DIAGNOSIS — Z5112 Encounter for antineoplastic immunotherapy: Secondary | ICD-10-CM | POA: Insufficient documentation

## 2018-09-09 DIAGNOSIS — Z79899 Other long term (current) drug therapy: Secondary | ICD-10-CM | POA: Diagnosis not present

## 2018-09-09 DIAGNOSIS — Z923 Personal history of irradiation: Secondary | ICD-10-CM | POA: Diagnosis not present

## 2018-09-09 DIAGNOSIS — C50212 Malignant neoplasm of upper-inner quadrant of left female breast: Secondary | ICD-10-CM

## 2018-09-09 DIAGNOSIS — Z9221 Personal history of antineoplastic chemotherapy: Secondary | ICD-10-CM | POA: Insufficient documentation

## 2018-09-09 DIAGNOSIS — Z170 Estrogen receptor positive status [ER+]: Secondary | ICD-10-CM | POA: Insufficient documentation

## 2018-09-09 MED ORDER — DIPHENHYDRAMINE HCL 25 MG PO CAPS
50.0000 mg | ORAL_CAPSULE | Freq: Once | ORAL | Status: AC
  Administered 2018-09-09: 50 mg via ORAL

## 2018-09-09 MED ORDER — SODIUM CHLORIDE 0.9 % IV SOLN
420.0000 mg | Freq: Once | INTRAVENOUS | Status: AC
  Administered 2018-09-09: 420 mg via INTRAVENOUS
  Filled 2018-09-09: qty 14

## 2018-09-09 MED ORDER — ACETAMINOPHEN 325 MG PO TABS
650.0000 mg | ORAL_TABLET | Freq: Once | ORAL | Status: AC
  Administered 2018-09-09: 650 mg via ORAL

## 2018-09-09 MED ORDER — DIPHENHYDRAMINE HCL 25 MG PO CAPS
ORAL_CAPSULE | ORAL | Status: AC
  Filled 2018-09-09: qty 1

## 2018-09-09 MED ORDER — TRASTUZUMAB CHEMO 150 MG IV SOLR
6.0000 mg/kg | Freq: Once | INTRAVENOUS | Status: AC
  Administered 2018-09-09: 399 mg via INTRAVENOUS
  Filled 2018-09-09: qty 19

## 2018-09-09 MED ORDER — HEPARIN SOD (PORK) LOCK FLUSH 100 UNIT/ML IV SOLN
500.0000 [IU] | Freq: Once | INTRAVENOUS | Status: AC | PRN
  Administered 2018-09-09: 500 [IU]
  Filled 2018-09-09: qty 5

## 2018-09-09 MED ORDER — SODIUM CHLORIDE 0.9 % IV SOLN
Freq: Once | INTRAVENOUS | Status: AC
  Administered 2018-09-09: 13:00:00 via INTRAVENOUS
  Filled 2018-09-09: qty 250

## 2018-09-09 MED ORDER — DIPHENHYDRAMINE HCL 25 MG PO CAPS
ORAL_CAPSULE | ORAL | Status: AC
  Filled 2018-09-09: qty 2

## 2018-09-09 MED ORDER — ACETAMINOPHEN 325 MG PO TABS
ORAL_TABLET | ORAL | Status: AC
  Filled 2018-09-09: qty 2

## 2018-09-09 MED ORDER — SODIUM CHLORIDE 0.9% FLUSH
10.0000 mL | INTRAVENOUS | Status: DC | PRN
  Administered 2018-09-09: 10 mL
  Filled 2018-09-09: qty 10

## 2018-09-09 NOTE — Patient Instructions (Signed)
[facility] Discharge Instructions for Patients Receiving Chemotherapy  Today you received the following chemotherapy agents Trastuzumab (Herceptin) & Pertuzumab (Perjeta).  To help prevent nausea and vomiting after your treatment, we encourage you to take your nausea medication as prescribed.   If you develop nausea and vomiting that is not controlled by your nausea medication, call the clinic.   BELOW ARE SYMPTOMS THAT SHOULD BE REPORTED IMMEDIATELY:  *FEVER GREATER THAN 100.5 F  *CHILLS WITH OR WITHOUT FEVER  NAUSEA AND VOMITING THAT IS NOT CONTROLLED WITH YOUR NAUSEA MEDICATION  *UNUSUAL SHORTNESS OF BREATH  *UNUSUAL BRUISING OR BLEEDING  TENDERNESS IN MOUTH AND THROAT WITH OR WITHOUT PRESENCE OF ULCERS  *URINARY PROBLEMS  *BOWEL PROBLEMS  UNUSUAL RASH Items with * indicate a potential emergency and should be followed up as soon as possible.  Feel free to call the clinic should you have any questions or concerns. The clinic phone number is [phone].  Please show the CHEMO ALERT CARD at check-in to the Emergency Department and triage nurse.   

## 2018-09-09 NOTE — Progress Notes (Signed)
Per Dr. Lindi Adie, ok to treat with no labs on 09/09/18.   Pt VSS upon leaving treatment area. Pt declined to stay for 30 minute observation period. Pt educated on importance of observation period. AVS printed with information on steps to take if she exhibits S/S of reaction.

## 2018-09-10 DIAGNOSIS — Z510 Encounter for antineoplastic radiation therapy: Secondary | ICD-10-CM | POA: Diagnosis not present

## 2018-09-10 NOTE — Progress Notes (Signed)
Radiation Oncology         [phone] ________________________________  Name: [name] MRN: [id number]  Date: 09/08/2018  DOB: 19-Nov-1946  SIMULATION AND TREATMENT PLANNING NOTE  //  Special treatment procedure  Outpatient  DIAGNOSIS:     ICD-10-CM   1. Malignant neoplasm of upper-inner quadrant of left breast in female, estrogen receptor positive ([place]) C50.212    Z17.0     NARRATIVE:  The patient was brought to the [facility].  Identity was confirmed.  All relevant records and images related to the planned course of therapy were reviewed.  The patient freely provided informed written consent to proceed with treatment after reviewing the details related to the planned course of therapy. The consent form was witnessed and verified by the simulation staff.    Then, the patient was set-up in a stable reproducible supine position for radiation therapy with her ipsilateral arm over her head, and her upper body secured in a custom-made Vac-lok device.  CT images were obtained.  Surface markings were placed.  The CT images were loaded into the planning software.    Special treatment procedure:  Special treatment procedure was performed today due to the extra time and effort required by myself to plan and prepare this patient for deep inspiration breath hold technique.  I have determined cardiac sparing to be of benefit to this patient to prevent long term cardiac damage due to radiation of the heart.  Bellows were placed on the patient's abdomen. To facilitate cardiac sparing, the patient was coached by the radiation therapists on breath hold techniques and breathing practice was performed. Practice waveforms were obtained. The patient was then scanned while maintaining breath hold in the treatment position.  This image was then transferred over to the imaging specialist. The imaging specialist then created a fusion of the free breathing and breath hold scans using the chest  wall as the stable structure. I personally reviewed the fusion in axial, coronal and sagittal image planes.  Excellent cardiac sparing was obtained.  I felt the patient is an appropriate candidate for breath hold and the patient will be treated as such.  The image fusion was then reviewed with the patient to reinforce the necessity of reproducible breath hold.  TREATMENT PLANNING NOTE: Treatment planning then occurred.  The radiation prescription was entered and confirmed.     A total of 3 medically necessary complex treatment devices were fabricated and supervised by me: 2 fields with MLCs for custom blocks to protect heart, and lungs;  and, a Vac-lok. MORE COMPLEX DEVICES MAY BE MADE IN DOSIMETRY FOR FIELD IN FIELD BEAMS FOR DOSE HOMOGENEITY.  I have requested : 3D Simulation which is medically necessary to give adequate dose to at risk tissues while sparing lungs and heart.  I have requested a DVH of the following structures: lungs, heart, lumpectomy cavity.    The patient will receive 42.56 Gy in 16 fractions to the left breast with 2 tangential fields.  This will not be followed by a boost.  Optical Surface Tracking Plan:  Since intensity modulated radiotherapy (IMRT) and 3D conformal radiation treatment methods are predicated on accurate and precise positioning for treatment, intrafraction motion monitoring is medically necessary to ensure accurate and safe treatment delivery. The ability to quantify intrafraction motion without excessive ionizing radiation dose can only be performed with optical surface tracking. Accordingly, surface imaging offers the opportunity to obtain 3D measurements of patient position throughout IMRT and 3D treatments without  excessive radiation exposure. I am ordering optical surface tracking for this patient's upcoming course of radiotherapy.  ________________________________   Reference:  Ursula Alert, J, et al. Surface imaging-based analysis of  intrafraction motion for breast radiotherapy patients.Journal of [employer], n. 6, nov. 2014. ISSN [id number].  Available at: <http://www.jacmp.org/index.php/jacmp/article/view/4957>.    -----------------------------------  Eppie Gibson, MD

## 2018-09-13 ENCOUNTER — Ambulatory Visit
Admission: RE | Admit: 2018-09-13 | Discharge: 2018-09-13 | Disposition: A | Discharge status: Home / Self Care | Payer: Medicare HMO | Source: Ambulatory Visit | Attending: Radiation Oncology | Admitting: Radiation Oncology

## 2018-09-13 DIAGNOSIS — C50212 Malignant neoplasm of upper-inner quadrant of left female breast: Secondary | ICD-10-CM

## 2018-09-13 DIAGNOSIS — Z170 Estrogen receptor positive status [ER+]: Principal | ICD-10-CM

## 2018-09-13 DIAGNOSIS — Z510 Encounter for antineoplastic radiation therapy: Secondary | ICD-10-CM | POA: Diagnosis not present

## 2018-09-13 MED ORDER — ALRA NON-METALLIC DEODORANT (RAD-ONC)
1.0000 "application " | Freq: Once | TOPICAL | Status: AC
  Administered 2018-09-13: 1 via TOPICAL

## 2018-09-13 MED ORDER — RADIAPLEXRX EX GEL
Freq: Once | CUTANEOUS | Status: AC
  Administered 2018-09-13: 11:00:00 via TOPICAL

## 2018-09-13 NOTE — Progress Notes (Signed)

## 2018-09-14 ENCOUNTER — Ambulatory Visit
Admission: RE | Admit: 2018-09-14 | Discharge: 2018-09-14 | Disposition: A | Discharge status: Home / Self Care | Payer: Medicare HMO | Source: Ambulatory Visit | Attending: Radiation Oncology | Admitting: Radiation Oncology

## 2018-09-14 DIAGNOSIS — Z510 Encounter for antineoplastic radiation therapy: Secondary | ICD-10-CM | POA: Diagnosis not present

## 2018-09-15 ENCOUNTER — Ambulatory Visit
Admission: RE | Admit: 2018-09-15 | Discharge: 2018-09-15 | Disposition: A | Discharge status: Home / Self Care | Payer: Medicare HMO | Source: Ambulatory Visit | Attending: Radiation Oncology | Admitting: Radiation Oncology

## 2018-09-15 DIAGNOSIS — Z510 Encounter for antineoplastic radiation therapy: Secondary | ICD-10-CM | POA: Diagnosis not present

## 2018-09-16 ENCOUNTER — Ambulatory Visit
Admission: RE | Admit: 2018-09-16 | Discharge: 2018-09-16 | Disposition: A | Discharge status: Home / Self Care | Payer: Medicare HMO | Source: Ambulatory Visit | Attending: Radiation Oncology | Admitting: Radiation Oncology

## 2018-09-16 DIAGNOSIS — Z510 Encounter for antineoplastic radiation therapy: Secondary | ICD-10-CM | POA: Diagnosis not present

## 2018-09-17 ENCOUNTER — Ambulatory Visit
Admission: RE | Admit: 2018-09-17 | Discharge: 2018-09-17 | Disposition: A | Discharge status: Home / Self Care | Payer: Medicare HMO | Source: Ambulatory Visit | Attending: Radiation Oncology | Admitting: Radiation Oncology

## 2018-09-17 DIAGNOSIS — Z510 Encounter for antineoplastic radiation therapy: Secondary | ICD-10-CM | POA: Diagnosis not present

## 2018-09-20 ENCOUNTER — Ambulatory Visit
Admission: RE | Admit: 2018-09-20 | Discharge: 2018-09-20 | Disposition: A | Discharge status: Home / Self Care | Payer: Medicare HMO | Source: Ambulatory Visit | Attending: Radiation Oncology | Admitting: Radiation Oncology

## 2018-09-20 DIAGNOSIS — Z510 Encounter for antineoplastic radiation therapy: Secondary | ICD-10-CM | POA: Diagnosis not present

## 2018-09-21 ENCOUNTER — Ambulatory Visit
Admission: RE | Admit: 2018-09-21 | Discharge: 2018-09-21 | Disposition: A | Discharge status: Home / Self Care | Payer: Medicare HMO | Source: Ambulatory Visit | Attending: Radiation Oncology | Admitting: Radiation Oncology

## 2018-09-21 DIAGNOSIS — Z510 Encounter for antineoplastic radiation therapy: Secondary | ICD-10-CM | POA: Diagnosis not present

## 2018-09-22 ENCOUNTER — Ambulatory Visit
Admission: RE | Admit: 2018-09-22 | Discharge: 2018-09-22 | Disposition: A | Discharge status: Home / Self Care | Payer: Medicare HMO | Source: Ambulatory Visit | Attending: Radiation Oncology | Admitting: Radiation Oncology

## 2018-09-22 DIAGNOSIS — Z510 Encounter for antineoplastic radiation therapy: Secondary | ICD-10-CM | POA: Diagnosis not present

## 2018-09-23 ENCOUNTER — Ambulatory Visit
Admission: RE | Admit: 2018-09-23 | Discharge: 2018-09-23 | Disposition: A | Discharge status: Home / Self Care | Payer: Medicare HMO | Source: Ambulatory Visit | Attending: Radiation Oncology | Admitting: Radiation Oncology

## 2018-09-23 DIAGNOSIS — Z510 Encounter for antineoplastic radiation therapy: Secondary | ICD-10-CM | POA: Diagnosis not present

## 2018-09-24 ENCOUNTER — Ambulatory Visit
Admission: RE | Admit: 2018-09-24 | Discharge: 2018-09-24 | Disposition: A | Discharge status: Home / Self Care | Payer: Medicare HMO | Source: Ambulatory Visit | Attending: Radiation Oncology | Admitting: Radiation Oncology

## 2018-09-24 DIAGNOSIS — Z510 Encounter for antineoplastic radiation therapy: Secondary | ICD-10-CM | POA: Diagnosis not present

## 2018-09-27 ENCOUNTER — Ambulatory Visit
Admission: RE | Admit: 2018-09-27 | Discharge: 2018-09-27 | Disposition: A | Discharge status: Home / Self Care | Payer: Medicare HMO | Source: Ambulatory Visit | Attending: Radiation Oncology | Admitting: Radiation Oncology

## 2018-09-27 DIAGNOSIS — Z510 Encounter for antineoplastic radiation therapy: Secondary | ICD-10-CM | POA: Diagnosis not present

## 2018-09-27 IMAGING — DX DG CHEST 1V PORT
1 series · 1 of 1 positions shown · non-contrast
Comparison: December 26, 2012

CLINICAL DATA: Port-A-Cath placement.  Breast carcinoma.

EXAM:
PORTABLE CHEST 1 VIEW

[chest]
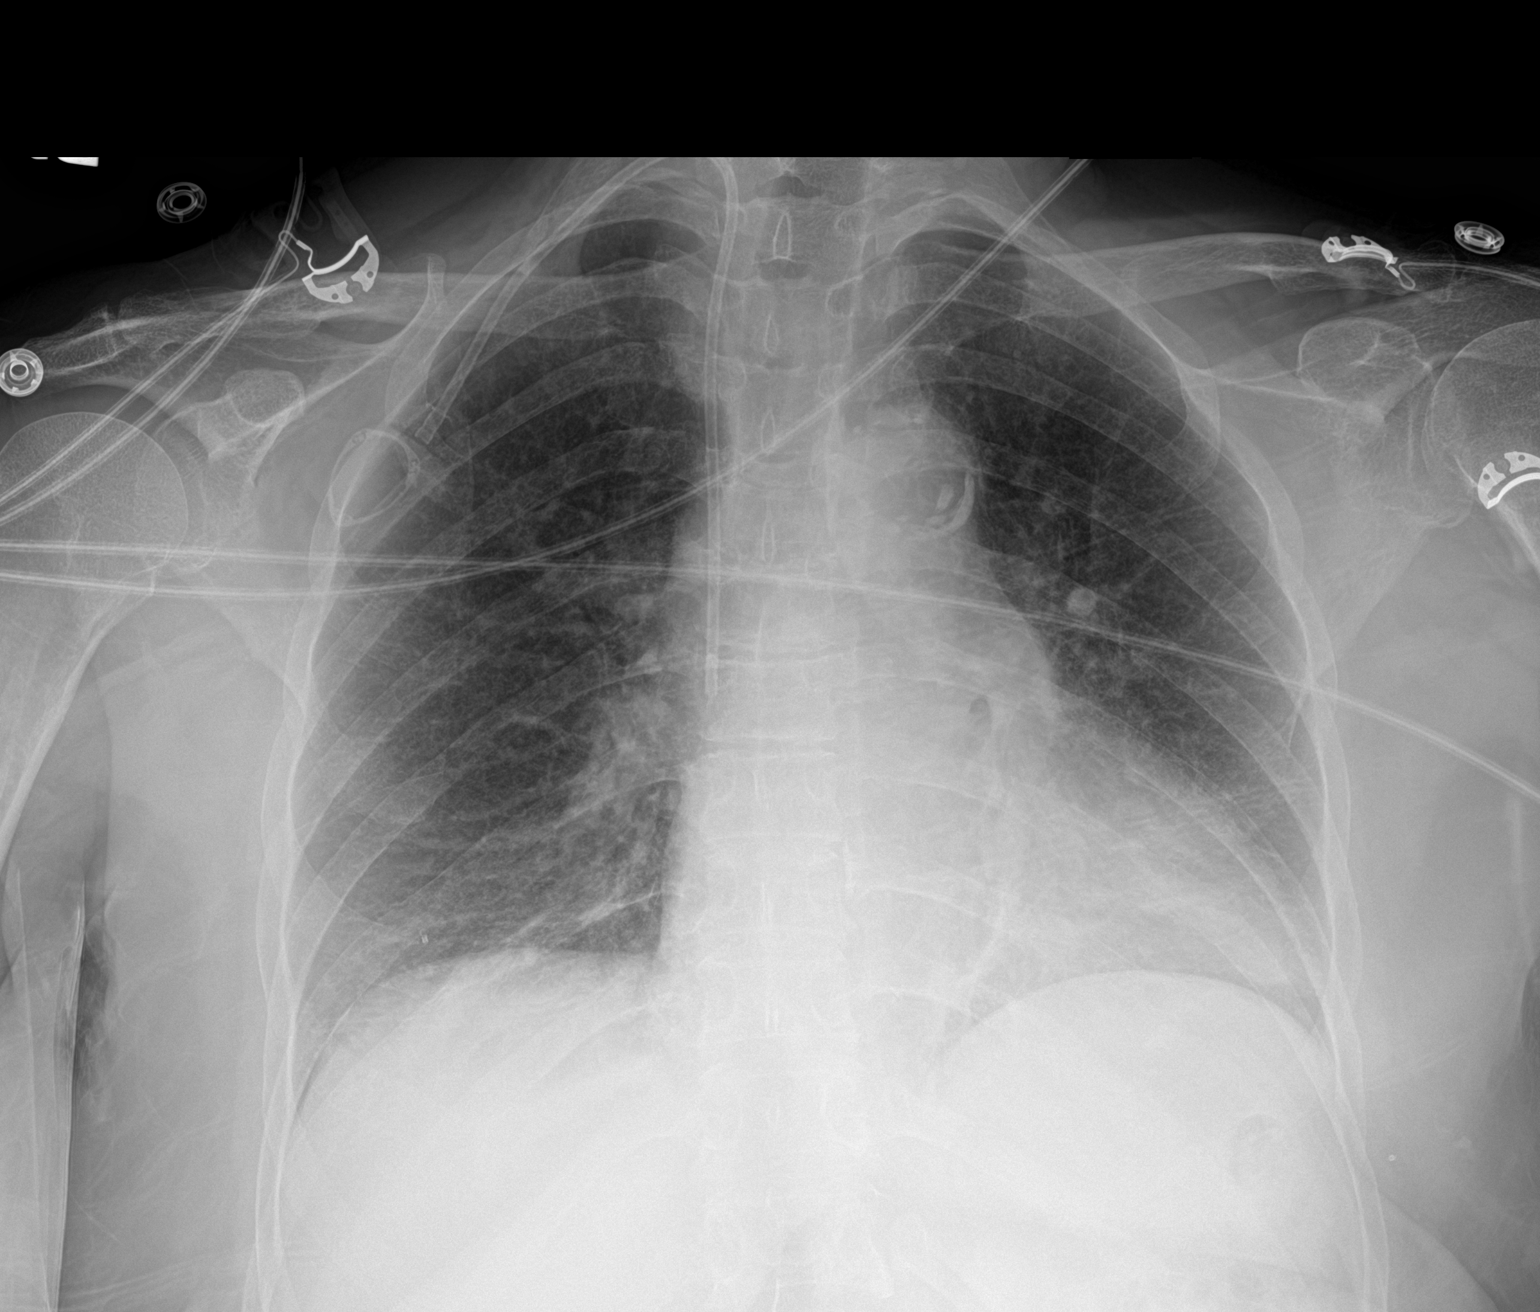

[1 of 1 positions shown; findings below may reference images not displayed]

FINDINGS: Port-A-Cath tip is in the superior vena cava. No pneumothorax. There
is mild scarring in the bases. The lungs elsewhere are clear. Heart
is upper normal in size with pulmonary vascularity normal. There is
aortic atherosclerosis. No adenopathy. No bone lesions.
IMPRESSION: Port-A-Cath tip in superior vena cava. No pneumothorax. Mild
scarring in the bases. Lungs elsewhere clear. Heart upper normal in
size. No adenopathy. There is aortic atherosclerosis.

Aortic Atherosclerosis ([id number]).

## 2018-09-28 ENCOUNTER — Ambulatory Visit
Admission: RE | Admit: 2018-09-28 | Discharge: 2018-09-28 | Disposition: A | Discharge status: Home / Self Care | Payer: Medicare HMO | Source: Ambulatory Visit | Attending: Radiation Oncology | Admitting: Radiation Oncology

## 2018-09-28 DIAGNOSIS — Z510 Encounter for antineoplastic radiation therapy: Secondary | ICD-10-CM | POA: Diagnosis not present

## 2018-09-29 ENCOUNTER — Ambulatory Visit
Admission: RE | Admit: 2018-09-29 | Discharge: 2018-09-29 | Disposition: A | Discharge status: Home / Self Care | Payer: Medicare HMO | Source: Ambulatory Visit | Attending: Radiation Oncology | Admitting: Radiation Oncology

## 2018-09-29 DIAGNOSIS — Z510 Encounter for antineoplastic radiation therapy: Secondary | ICD-10-CM | POA: Diagnosis not present

## 2018-09-30 ENCOUNTER — Telehealth: Payer: Self-pay | Admitting: Hematology and Oncology

## 2018-09-30 ENCOUNTER — Inpatient Hospital Stay: Payer: Medicare HMO

## 2018-09-30 ENCOUNTER — Encounter: Payer: Self-pay | Admitting: *Deleted

## 2018-09-30 ENCOUNTER — Ambulatory Visit
Admission: RE | Admit: 2018-09-30 | Discharge: 2018-09-30 | Disposition: A | Discharge status: Home / Self Care | Payer: Medicare HMO | Source: Ambulatory Visit | Attending: Radiation Oncology | Admitting: Radiation Oncology

## 2018-09-30 ENCOUNTER — Inpatient Hospital Stay (HOSPITAL_BASED_OUTPATIENT_CLINIC_OR_DEPARTMENT_OTHER): Payer: Medicare HMO | Admitting: Hematology and Oncology

## 2018-09-30 ENCOUNTER — Inpatient Hospital Stay: Payer: Medicare HMO | Admitting: *Deleted

## 2018-09-30 DIAGNOSIS — Z170 Estrogen receptor positive status [ER+]: Principal | ICD-10-CM

## 2018-09-30 DIAGNOSIS — C50212 Malignant neoplasm of upper-inner quadrant of left female breast: Secondary | ICD-10-CM | POA: Diagnosis not present

## 2018-09-30 DIAGNOSIS — Z79899 Other long term (current) drug therapy: Secondary | ICD-10-CM

## 2018-09-30 DIAGNOSIS — Z5112 Encounter for antineoplastic immunotherapy: Secondary | ICD-10-CM

## 2018-09-30 DIAGNOSIS — Z923 Personal history of irradiation: Secondary | ICD-10-CM

## 2018-09-30 DIAGNOSIS — Z9221 Personal history of antineoplastic chemotherapy: Secondary | ICD-10-CM

## 2018-09-30 DIAGNOSIS — R112 Nausea with vomiting, unspecified: Secondary | ICD-10-CM

## 2018-09-30 DIAGNOSIS — Z95828 Presence of other vascular implants and grafts: Secondary | ICD-10-CM

## 2018-09-30 DIAGNOSIS — T451X5A Adverse effect of antineoplastic and immunosuppressive drugs, initial encounter: Principal | ICD-10-CM

## 2018-09-30 DIAGNOSIS — Z7982 Long term (current) use of aspirin: Secondary | ICD-10-CM

## 2018-09-30 DIAGNOSIS — Z510 Encounter for antineoplastic radiation therapy: Secondary | ICD-10-CM | POA: Diagnosis not present

## 2018-09-30 LAB — CMP (CANCER CENTER ONLY)
ALBUMIN: 4 g/dL (ref 3.5–5.0)
ALT: 10 U/L (ref 0–44)
ANION GAP: 10 (ref 5–15)
AST: 18 U/L (ref 15–41)
Alkaline Phosphatase: 88 U/L (ref 38–126)
BUN: 15 mg/dL (ref 8–23)
CHLORIDE: 107 mmol/L (ref 98–111)
CO2: 24 mmol/L (ref 22–32)
Calcium: 9.8 mg/dL (ref 8.9–10.3)
Creatinine: 0.94 mg/dL (ref 0.44–1.00)
GFR, Est AFR Am: >60 mL/min (ref >60)
GFR, Estimated: >60 mL/min (ref >60)
GLUCOSE: 92 mg/dL (ref 70–99)
POTASSIUM: 4.4 mmol/L (ref 3.5–5.1)
SODIUM: 141 mmol/L (ref 135–145)
Total Bilirubin: 0.5 mg/dL (ref 0.3–1.2)
Total Protein: 7.5 g/dL (ref 6.5–8.1)

## 2018-09-30 LAB — CBC WITH DIFFERENTIAL (CANCER CENTER ONLY)
Abs Immature Granulocytes: 0.04 10*3/uL (ref 0.00–0.07)
BASOS PCT: 0 %
Basophils Absolute: 0 10*3/uL (ref 0.0–0.1)
Eosinophils Absolute: 0 10*3/uL (ref 0.0–0.5)
Eosinophils Relative: 0 %
HEMATOCRIT: 33.2 % — AB (ref 36.0–46.0)
HEMOGLOBIN: 11.1 g/dL — AB (ref 12.0–15.0)
Immature Granulocytes: 0 %
LYMPHS ABS: 1.1 10*3/uL (ref 0.7–4.0)
LYMPHS PCT: 9 %
MCH: 28.7 pg (ref 26.0–34.0)
MCHC: 33.4 g/dL (ref 30.0–36.0)
MCV: 85.8 fL (ref 80.0–100.0)
MONOS PCT: 7 %
Monocytes Absolute: 0.8 10*3/uL (ref 0.1–1.0)
NEUTROS PCT: 84 %
Neutro Abs: 9.6 10*3/uL — ABNORMAL HIGH (ref 1.7–7.7)
Platelet Count: 380 10*3/uL (ref 150–400)
RBC: 3.87 MIL/uL (ref 3.87–5.11)
RDW: 13.8 % (ref 11.5–15.5)
WBC Count: 11.6 10*3/uL — ABNORMAL HIGH (ref 4.0–10.5)
nRBC: 0 % (ref 0.0–0.2)

## 2018-09-30 MED ORDER — LIDOCAINE-PRILOCAINE 2.5-2.5 % EX CREA: TOPICAL_CREAM | CUTANEOUS | 3 refills | Status: DC

## 2018-09-30 MED ORDER — TRASTUZUMAB CHEMO 150 MG IV SOLR
6.0000 mg/kg | Freq: Once | INTRAVENOUS | Status: AC
  Administered 2018-09-30: 399 mg via INTRAVENOUS
  Filled 2018-09-30: qty 19

## 2018-09-30 MED ORDER — ONDANSETRON HCL 8 MG PO TABS: 8.0000 mg | ORAL_TABLET | Freq: Two times a day (BID) | ORAL | 1 refills | Status: DC | PRN

## 2018-09-30 MED ORDER — ACETAMINOPHEN 325 MG PO TABS
ORAL_TABLET | ORAL | Status: AC
  Filled 2018-09-30: qty 2

## 2018-09-30 MED ORDER — ACETAMINOPHEN 325 MG PO TABS
650.0000 mg | ORAL_TABLET | Freq: Once | ORAL | Status: AC
  Administered 2018-09-30: 650 mg via ORAL

## 2018-09-30 MED ORDER — PROCHLORPERAZINE MALEATE 10 MG PO TABS: 10.0000 mg | ORAL_TABLET | Freq: Four times a day (QID) | ORAL | 1 refills | Status: DC | PRN

## 2018-09-30 MED ORDER — SODIUM CHLORIDE 0.9 % IV SOLN
Freq: Once | INTRAVENOUS | Status: AC
  Administered 2018-09-30: 13:00:00 via INTRAVENOUS
  Filled 2018-09-30: qty 250

## 2018-09-30 MED ORDER — DIPHENHYDRAMINE HCL 25 MG PO CAPS
ORAL_CAPSULE | ORAL | Status: AC
  Filled 2018-09-30: qty 2

## 2018-09-30 MED ORDER — LORAZEPAM 0.5 MG PO TABS: 0.5000 mg | ORAL_TABLET | Freq: Every evening | ORAL | 1 refills | Status: DC | PRN

## 2018-09-30 MED ORDER — LORAZEPAM 0.5 MG PO TABS: 0.5000 mg | ORAL_TABLET | Freq: Every evening | ORAL | 0 refills | Status: DC | PRN

## 2018-09-30 MED ORDER — SODIUM CHLORIDE 0.9% FLUSH
10.0000 mL | INTRAVENOUS | Status: DC | PRN
  Administered 2018-09-30: 10 mL
  Filled 2018-09-30: qty 10

## 2018-09-30 MED ORDER — SODIUM CHLORIDE 0.9 % IV SOLN
420.0000 mg | Freq: Once | INTRAVENOUS | Status: AC
  Administered 2018-09-30: 420 mg via INTRAVENOUS
  Filled 2018-09-30: qty 14

## 2018-09-30 MED ORDER — HEPARIN SOD (PORK) LOCK FLUSH 100 UNIT/ML IV SOLN
500.0000 [IU] | Freq: Once | INTRAVENOUS | Status: AC | PRN
  Administered 2018-09-30: 500 [IU]
  Filled 2018-09-30: qty 5

## 2018-09-30 MED ORDER — DIPHENHYDRAMINE HCL 25 MG PO CAPS
50.0000 mg | ORAL_CAPSULE | Freq: Once | ORAL | Status: AC
  Administered 2018-09-30: 50 mg via ORAL

## 2018-09-30 MED FILL — LORazepam 0.5 MG TABS: 0.5 | 30 days supply | Qty: 30 | Fill #0

## 2018-09-30 MED FILL — PROCHLORPERAZINE 10 MG TAB: 10 | 7 days supply | Qty: 30 | Fill #0

## 2018-09-30 MED FILL — LIDOCAINE-PRILOCAINE CREAM: 2.5-2.5 | 15 days supply | Qty: 30 | Fill #0

## 2018-09-30 MED FILL — ONDANSETRON HCL 8 MG TABLET: 8 | 15 days supply | Qty: 30 | Fill #0

## 2018-09-30 NOTE — Telephone Encounter (Signed)
Gave patient avs and calendar.   °

## 2018-09-30 NOTE — Patient Instructions (Signed)
[facility] Discharge Instructions for Patients Receiving Chemotherapy  Today you received the following chemotherapy agents Trastuzumab (Herceptin) & Pertuzumab (Perjeta).  To help prevent nausea and vomiting after your treatment, we encourage you to take your nausea medication as prescribed.   If you develop nausea and vomiting that is not controlled by your nausea medication, call the clinic.   BELOW ARE SYMPTOMS THAT SHOULD BE REPORTED IMMEDIATELY:  *FEVER GREATER THAN 100.5 F  *CHILLS WITH OR WITHOUT FEVER  NAUSEA AND VOMITING THAT IS NOT CONTROLLED WITH YOUR NAUSEA MEDICATION  *UNUSUAL SHORTNESS OF BREATH  *UNUSUAL BRUISING OR BLEEDING  TENDERNESS IN MOUTH AND THROAT WITH OR WITHOUT PRESENCE OF ULCERS  *URINARY PROBLEMS  *BOWEL PROBLEMS  UNUSUAL RASH Items with * indicate a potential emergency and should be followed up as soon as possible.  Feel free to call the clinic should you have any questions or concerns. The clinic phone number is [phone].  Please show the CHEMO ALERT CARD at check-in to the Emergency Department and triage nurse.   

## 2018-09-30 NOTE — Assessment & Plan Note (Signed)
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, copy #16.3, axillary lymph node biopsy benign concordant, T2 N0 stage II a clinical stage AJCC 8  Treatment plan: 1.Neoadjuvant chemotherapy with TCH Perjeta x 3 cycles (discontinued early due to toxicities of severe diarrhea nausea and vomiting) 02/26/2018 to 04/15/2018 followed by Herceptin Perjeta maintenance for 1 year 2.followed by breast conserving surgery 08/12/2018: Pathologic complete response 25fllowed by adjuvant radiation 4.Followed by adjuvant antiestrogen therapy with letrozole for 5-7 years ------------------------------------------------------------------------------------------------------------------------ 08/12/2018: Left lumpectomy: Pathologic complete response, 0/4 lymph nodes negative Adjuvant radiation started 09/14/2018  Current treatment: Herceptin Perjeta maintenance every 3 weeks. Tolerating it extremely well. Monitoring closely for toxicities  Return to clinic every 3 weeks for Herceptin and Perjeta every 6 weeks for follow-up with me

## 2018-09-30 NOTE — Progress Notes (Signed)
Patient Care Team: [name], MD as PCP - General (Internal Medicine)  DIAGNOSIS:  Encounter Diagnoses  Name Primary?  . Chemotherapy induced nausea and vomiting   . Malignant neoplasm of upper-inner quadrant of left breast in female, estrogen receptor positive ([place])     SUMMARY OF ONCOLOGIC HISTORY:   Malignant neoplasm of upper-inner quadrant of left breast in female, estrogen receptor positive ([place])   02/03/2018 Initial Diagnosis    Left breast palpable lump at 1130 position 2.3 cm, at 11 o'clock position 6 mm which was benign; biopsy revealed IDC grade 3 ER 30%, PR 0%, Ki-67 30%, HER-2 positive ratio 5.62, copy #16.3, axillary lymph node biopsy benign concordant, T2 N0 stage II a clinical stage AJCC 8    02/18/2018 Breast MRI    Solitary mass in left breast upper outer quadrant 2.5 cm.  No additional enhancement in the right breast.  No adenopathy    02/26/2018 - 04/15/2018 Neo-Adjuvant Chemotherapy    TCH Perjeta X 3 cycles (stopped early due to toxicities) followed by Herceptin Perjeta maintenance    06/18/2018 Breast MRI    Marked reduction in the abnormal enhancement in the upper inner left breast at the site of known cancer.  There is approximately 2.5 cm of residual linear non mass enhancement at the site of known cancer.    08/12/2018 Surgery    Left lumpectomy: No residual cancer identified 0/4 lymph nodes negative, complete pathologic response    09/14/2018 -  Radiation Therapy    Adjuvant radiation therapy     CHIEF COMPLIANT: Follow-up on Herceptin and Perjeta, ongoing radiation  INTERVAL HISTORY: [name] is a 72 year old with above-mentioned history of HER-2 positive left breast cancer currently on adjuvant Herceptin and Perjeta and appears to be tolerating it very well.  She is also undergoing radiation.  Her biggest complaints are related to fatigue as well as difficulty with sleeping.  REVIEW OF SYSTEMS:   Constitutional: Denies fevers, chills or  abnormal weight loss Eyes: Denies blurriness of vision Ears, nose, mouth, throat, and face: Denies mucositis or sore throat Respiratory: Denies cough, dyspnea or wheezes Cardiovascular: Denies palpitation, chest discomfort Gastrointestinal:  Denies nausea, heartburn or change in bowel habits Skin: Denies abnormal skin rashes Lymphatics: Denies new lymphadenopathy or easy bruising Neurological:Denies numbness, tingling or new weaknesses Behavioral/Psych: Mood is stable, no new changes  Extremities: No lower extremity edema   All other systems were reviewed with the patient and are negative.  I have reviewed the past medical history, past surgical history, social history and family history with the patient and they are unchanged from previous note.  ALLERGIES:  is allergic to codeine and demerol [meperidine].  MEDICATIONS:  Current Outpatient Medications  Medication Sig Dispense Refill  . amLODipine (NORVASC) 10 MG tablet Take 10 mg by mouth daily.     Marland Kitchen buPROPion (WELLBUTRIN SR) 150 MG 12 hr tablet Take 1 tablet (150 mg total) by mouth daily for 3 days, THEN 1 tablet (150 mg total) 2 (two) times daily. (Patient not taking: Reported on 08/25/2018) 63 tablet 3  . clotrimazole-betamethasone (LOTRISONE) cream Apply 1 application topically 2 (two) times daily. 30 g 0  . diphenoxylate-atropine (LOMOTIL) 2.5-0.025 MG tablet Take 1 tablet by mouth 4 (four) times daily as needed for diarrhea or loose stools. (Patient not taking: Reported on 08/25/2018) 60 tablet 3  . hydrochlorothiazide (HYDRODIURIL) 12.5 MG tablet Take 12.5 mg by mouth daily.     . hydrOXYzine (ATARAX/VISTARIL) 50 MG tablet Take  50 mg by mouth 3 (three) times daily as needed for anxiety.     Marland Kitchen latanoprost (XALATAN) 0.005 % ophthalmic solution Place 1 drop into both eyes at bedtime.     . lidocaine-prilocaine (EMLA) cream Apply to affected area once 30 g 3  . LORazepam (ATIVAN) 0.5 MG tablet Take 1 tablet (0.5 mg total) by mouth at  bedtime as needed for anxiety. 30 tablet 1  . nicotine (NICODERM CQ - DOSED IN MG/24 HOURS) 21 mg/24hr patch Place 1 patch (21 mg total) onto the skin daily. Apply 21 mg patch daily x 6 wk, then 90m patch daily x 2 wk, then 7 mg patch daily x 2 wk (Patient not taking: Reported on 08/25/2018) 14 patch 2  . ondansetron (ZOFRAN) 8 MG tablet Take 1 tablet (8 mg total) by mouth 2 (two) times daily as needed for refractory nausea / vomiting. 30 tablet 1  . pramipexole (MIRAPEX) 0.25 MG tablet Take 0.25-1 mg by mouth at bedtime.     . prochlorperazine (COMPAZINE) 10 MG tablet Take 1 tablet (10 mg total) by mouth every 6 (six) hours as needed (Nausea or vomiting). 30 tablet 1   No current facility-administered medications for this visit.    Facility-Administered Medications Ordered in Other Visits  Medication Dose Route Frequency Provider Last Rate Last Dose  . heparin lock flush 100 unit/mL  500 Units Intracatheter Once PRN GNicholas Lose MD      . pertuzumab (PERJETA) 420 mg in sodium chloride 0.9 % 250 mL chemo infusion  420 mg Intravenous Once GNicholas Lose MD      . sodium chloride flush (NS) 0.9 % injection 10 mL  10 mL Intracatheter PRN GNicholas Lose MD      . trastuzumab (HERCEPTIN) 399 mg in sodium chloride 0.9 % 250 mL chemo infusion  6 mg/kg (Treatment Plan Recorded) Intravenous Once GNicholas Lose MD 538 mL/hr at 09/30/18 1334 399 mg at 09/30/18 1334    PHYSICAL EXAMINATION: ECOG PERFORMANCE STATUS: 1 - Symptomatic but completely ambulatory  Vitals:   09/30/18 1041  BP: (!) 171/79  Pulse: 60  Resp: 16  Temp: 98.3 F (36.8 C)  SpO2: 100%   Filed Weights   09/30/18 1041  Weight: 146 lb 11.2 oz (66.5 kg)    GENERAL:alert, no distress and comfortable SKIN: skin color, texture, turgor are normal, no rashes or significant lesions EYES: normal, Conjunctiva are pink and non-injected, sclera clear OROPHARYNX:no exudate, no erythema and lips, buccal mucosa, and tongue normal  NECK:  supple, thyroid normal size, non-tender, without nodularity LYMPH:  no palpable lymphadenopathy in the cervical, axillary or inguinal LUNGS: clear to auscultation and percussion with normal breathing effort HEART: regular rate & rhythm and no murmurs and no lower extremity edema ABDOMEN:abdomen soft, non-tender and normal bowel sounds MUSCULOSKELETAL:no cyanosis of digits and no clubbing  NEURO: alert & oriented x 3 with fluent speech, no focal motor/sensory deficits EXTREMITIES: No lower extremity edema   LABORATORY DATA:  I have reviewed the data as listed CMP Latest Ref Rng & Units 09/30/2018 08/20/2018 07/29/2018  Glucose 70 - 99 mg/dL 93 102(H) 83  BUN 8 - 23 mg/dL _0 Creatinine 0.44 - 1.00 mg/dL 0.92 0.96 0.89  Sodium 135 - 145 mmol/L 143 140 141  Potassium 3.5 - 5.1 mmol/L 5.2(H) 4.4 4.4  Chloride 98 - 111 mmol/L 108 108 108  CO2 22 - 32 mmol/L _1 Calcium 8.9 - 10.3 mg/dL 9.8 9.3 9.5  Total Protein 6.5 - 8.1 g/dL 7.9 7.1 7.3  Total Bilirubin 0.3 - 1.2 mg/dL 0.4 0.3 0.3  Alkaline Phos 38 - 126 U/L 91 90 86  AST 15 - 41 U/L 30 13(L) 18  ALT 0 - 44 U/L _0 Lab Results  Component Value Date   WBC 11.6 (H) 09/30/2018   HGB 11.1 (L) 09/30/2018   HCT 33.2 (L) 09/30/2018   MCV 85.8 09/30/2018   PLT 380 09/30/2018   NEUTROABS 9.6 (H) 09/30/2018    ASSESSMENT & PLAN:  Malignant neoplasm of upper-inner quadrant of left breast in female, estrogen receptor positive ([facility]) 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, copy #16.3, axillary lymph node biopsy benign concordant, T2 N0 stage II a clinical stage AJCC 8  Treatment plan: 1.Neoadjuvant chemotherapy with TCH Perjeta x 3 cycles (discontinued early due to toxicities of severe diarrhea nausea and vomiting) 02/26/2018 to 04/15/2018 followed by Herceptin Perjeta maintenance  for 1 year 2.followed by breast conserving surgery 08/12/2018: Pathologic complete response 13fllowed by adjuvant radiation 4.Followed by adjuvant antiestrogen therapy with letrozole for 5-7 years ------------------------------------------------------------------------------------------------------------------------ 08/12/2018: Left lumpectomy: Pathologic complete response, 0/4 lymph nodes negative Adjuvant radiation started 09/14/2018  Current treatment: Herceptin Perjeta maintenance every 3 weeks. Tolerating it extremely well. Monitoring closely for toxicities  Return to clinic every 3 weeks for Herceptin and Perjeta every 6 weeks for follow-up with me    No orders of the defined types were placed in this encounter.  The patient has a good understanding of the overall plan. she agrees with it. she will call with any problems that may develop before the next visit here.   VHarriette Ohara MD 09/30/18

## 2018-10-01 ENCOUNTER — Ambulatory Visit
Admission: RE | Admit: 2018-10-01 | Discharge: 2018-10-01 | Disposition: A | Discharge status: Home / Self Care | Payer: Medicare HMO | Source: Ambulatory Visit | Attending: Radiation Oncology | Admitting: Radiation Oncology

## 2018-10-01 DIAGNOSIS — Z510 Encounter for antineoplastic radiation therapy: Secondary | ICD-10-CM | POA: Diagnosis not present

## 2018-10-01 DIAGNOSIS — Z170 Estrogen receptor positive status [ER+]: Secondary | ICD-10-CM | POA: Insufficient documentation

## 2018-10-01 DIAGNOSIS — C50212 Malignant neoplasm of upper-inner quadrant of left female breast: Secondary | ICD-10-CM | POA: Insufficient documentation

## 2018-10-04 ENCOUNTER — Ambulatory Visit
Admission: RE | Admit: 2018-10-04 | Discharge: 2018-10-04 | Disposition: A | Discharge status: Home / Self Care | Payer: Medicare HMO | Source: Ambulatory Visit | Attending: Radiation Oncology | Admitting: Radiation Oncology

## 2018-10-04 ENCOUNTER — Encounter: Payer: Self-pay | Admitting: Radiation Oncology

## 2018-10-04 DIAGNOSIS — Z510 Encounter for antineoplastic radiation therapy: Secondary | ICD-10-CM | POA: Diagnosis not present

## 2018-10-12 NOTE — Progress Notes (Signed)
  Radiation Oncology         [phone] ________________________________  Name: [name] MRN: [id number]  Date: 10/04/2018  DOB: September 25, 1946  End of Treatment Note  Diagnosis:   72 y.o. female with Malignant neoplasm of upper-inner quadrant of left breast in female, estrogen receptor positive ([place]) Staging form: Breast, AJCC 8th Edition - Clinical stage from 02/10/2018: Stage IIA (cT2, cN0, cM0, G3, ER: Positive, PR: Negative, HER2: Positive) - Unsigned Staging comments: Staged at breast conference on 3.13.19 ypT0 ypN0    Indication for treatment:  Curative       Radiation treatment dates:   09/13/2018 - 10/04/2018  Site/dose:   Left Breast / 42.56 Gy in 16 fractions  Beams/energy:   3D / 6X, 10X Photon  Narrative: The patient tolerated radiation treatment relatively well.  She experienced increased fatigue and some expected skin irritation. Her left breast was moderately erythematous and slightly hyperpigmented with swelling, but her skin remained intact. She is using Radiaplex twice daily as directed. She also noted occasional sharp left breast pain that has improved.  Plan: The patient has completed radiation treatment. The patient will return to radiation oncology clinic for routine followup in one month. I advised them to call or return sooner if they have any questions or concerns related to their recovery or treatment.  -----------------------------------  Eppie Gibson, MD  This document serves as a record of services personally performed by Eppie Gibson, MD. It was created on her behalf by Rae Lips, a trained medical scribe. The creation of this record is based on the scribe's personal observations and the provider's statements to them. This document has been checked and approved by the attending provider.

## 2018-10-13 ENCOUNTER — Telehealth: Payer: Self-pay

## 2018-10-13 NOTE — Telephone Encounter (Signed)
[name] called today. She completed radiation to her left breast on 10/04/18. She is concerned about peeling and irritation underneath her left breast and left axilla. She tells me that she is using radiaplex and tissue to help control the irritation. I advised her to begin using neosporin to the areas that are peeling. I also advised her to do use a non-adherent dressing as a barrier between her skin and clothes to help with irritation. She denies any areas of moisture to her radiation site. I gave her my direct number to call if she had any further questions or concerns.

## 2018-10-21 ENCOUNTER — Inpatient Hospital Stay: Payer: Medicare HMO | Attending: Hematology and Oncology

## 2018-10-21 VITALS — BP 159/88 | HR 55 | Temp 98.0°F | Resp 14

## 2018-10-21 DIAGNOSIS — Z170 Estrogen receptor positive status [ER+]: Secondary | ICD-10-CM | POA: Insufficient documentation

## 2018-10-21 DIAGNOSIS — C50212 Malignant neoplasm of upper-inner quadrant of left female breast: Secondary | ICD-10-CM | POA: Diagnosis not present

## 2018-10-21 DIAGNOSIS — Z5112 Encounter for antineoplastic immunotherapy: Secondary | ICD-10-CM | POA: Diagnosis present

## 2018-10-21 DIAGNOSIS — Z79899 Other long term (current) drug therapy: Secondary | ICD-10-CM | POA: Insufficient documentation

## 2018-10-21 MED ORDER — SODIUM CHLORIDE 0.9% FLUSH
10.0000 mL | INTRAVENOUS | Status: DC | PRN
  Filled 2018-10-21: qty 10

## 2018-10-21 MED ORDER — DIPHENHYDRAMINE HCL 25 MG PO CAPS
50.0000 mg | ORAL_CAPSULE | Freq: Once | ORAL | Status: AC
  Administered 2018-10-21: 50 mg via ORAL

## 2018-10-21 MED ORDER — TRASTUZUMAB CHEMO 150 MG IV SOLR
6.0000 mg/kg | Freq: Once | INTRAVENOUS | Status: AC
  Administered 2018-10-21: 399 mg via INTRAVENOUS
  Filled 2018-10-21: qty 19

## 2018-10-21 MED ORDER — ACETAMINOPHEN 325 MG PO TABS
ORAL_TABLET | ORAL | Status: AC
  Filled 2018-10-21: qty 2

## 2018-10-21 MED ORDER — DIPHENHYDRAMINE HCL 25 MG PO CAPS
ORAL_CAPSULE | ORAL | Status: AC
  Filled 2018-10-21: qty 2

## 2018-10-21 MED ORDER — SODIUM CHLORIDE 0.9 % IV SOLN
420.0000 mg | Freq: Once | INTRAVENOUS | Status: AC
  Administered 2018-10-21: 420 mg via INTRAVENOUS
  Filled 2018-10-21: qty 14

## 2018-10-21 MED ORDER — ACETAMINOPHEN 325 MG PO TABS
650.0000 mg | ORAL_TABLET | Freq: Once | ORAL | Status: AC
  Administered 2018-10-21: 650 mg via ORAL

## 2018-10-21 MED ORDER — SODIUM CHLORIDE 0.9 % IV SOLN
Freq: Once | INTRAVENOUS | Status: AC
  Administered 2018-10-21: 13:00:00 via INTRAVENOUS
  Filled 2018-10-21: qty 250

## 2018-10-21 MED ORDER — HEPARIN SOD (PORK) LOCK FLUSH 100 UNIT/ML IV SOLN
500.0000 [IU] | Freq: Once | INTRAVENOUS | Status: DC | PRN
  Filled 2018-10-21: qty 5

## 2018-10-21 NOTE — Patient Instructions (Signed)
[facility] Discharge Instructions for Patients Receiving Chemotherapy  Today you received the following chemotherapy agents Trastuzumab (Herceptin) & Pertuzumab (Perjeta).  To help prevent nausea and vomiting after your treatment, we encourage you to take your nausea medication as prescribed.   If you develop nausea and vomiting that is not controlled by your nausea medication, call the clinic.   BELOW ARE SYMPTOMS THAT SHOULD BE REPORTED IMMEDIATELY:  *FEVER GREATER THAN 100.5 F  *CHILLS WITH OR WITHOUT FEVER  NAUSEA AND VOMITING THAT IS NOT CONTROLLED WITH YOUR NAUSEA MEDICATION  *UNUSUAL SHORTNESS OF BREATH  *UNUSUAL BRUISING OR BLEEDING  TENDERNESS IN MOUTH AND THROAT WITH OR WITHOUT PRESENCE OF ULCERS  *URINARY PROBLEMS  *BOWEL PROBLEMS  UNUSUAL RASH Items with * indicate a potential emergency and should be followed up as soon as possible.  Feel free to call the clinic should you have any questions or concerns. The clinic phone number is [phone].  Please show the [facility] at check-in to the Emergency Department and triage nurse.

## 2018-10-21 NOTE — Progress Notes (Signed)
Okay to proceed with Herceptin and Perjeta with labs from 09/30/2018 per Dr. Lindi Adie.

## 2018-11-01 ENCOUNTER — Encounter: Payer: Self-pay | Admitting: Radiation Oncology

## 2018-11-01 NOTE — Progress Notes (Signed)
Ms. Friscia presents for follow up of radiation completed 10/04/18 to her left breast. She saw Dr. Lindi Adie last on 09/30/18. She continues Herceptin and Perjeta every 3 weeks. She will see Mendel Ryder NP on 11/11/18. The skin underneath her Left Breast healed. She was using neosporin but felt like it kept her skin too moist. She bought desitin ointment which she reports helped her heal well. She is now using radiaplex daily to [place] her radiation site. She was advised to begin using a vitamin E containing lotion or oil. She voiced her understanding.   BP (!) 156/74 (BP Location: Right Arm, Patient Position: Sitting)   Pulse 69   Temp 98.2 F (36.8 C) (Oral)   Resp 18   Ht 5\' 3"  (1.6 m)   Wt 147 lb (66.7 kg)   SpO2 100%   BMI 26.04 kg/m    Wt Readings from Last 3 Encounters:  11/03/18 147 lb (66.7 kg)  09/30/18 146 lb 11.2 oz (66.5 kg)  08/25/18 146 lb 8 oz (66.5 kg)

## 2018-11-03 ENCOUNTER — Encounter: Payer: Self-pay | Admitting: Radiation Oncology

## 2018-11-03 ENCOUNTER — Other Ambulatory Visit: Payer: Self-pay

## 2018-11-03 ENCOUNTER — Ambulatory Visit
Admission: RE | Admit: 2018-11-03 | Discharge: 2018-11-03 | Disposition: A | Discharge status: Home / Self Care | Payer: Medicare HMO | Source: Ambulatory Visit | Attending: Radiation Oncology | Admitting: Radiation Oncology

## 2018-11-03 DIAGNOSIS — Z170 Estrogen receptor positive status [ER+]: Secondary | ICD-10-CM | POA: Insufficient documentation

## 2018-11-03 DIAGNOSIS — C50212 Malignant neoplasm of upper-inner quadrant of left female breast: Secondary | ICD-10-CM | POA: Diagnosis not present

## 2018-11-03 DIAGNOSIS — Z79899 Other long term (current) drug therapy: Secondary | ICD-10-CM | POA: Insufficient documentation

## 2018-11-03 HISTORY — DX: Personal history of irradiation: Z92.3

## 2018-11-03 NOTE — Progress Notes (Signed)
Radiation Oncology         [phone] ________________________________  Name: [name] MRN: [id number]  Date: 11/03/2018  DOB: 30-Jul-1946  Follow-Up Visit Note  Outpatient  CC: [name], MD  Erroll Luna, MD  Diagnosis and Prior Radiotherapy:    ICD-10-CM   1. Malignant neoplasm of upper-inner quadrant of left breast in female, estrogen receptor positive ([place]) C50.212    Z17.0   Malignant neoplasm of upper-inner quadrant of left breast in female, estrogen receptor positive ([place]) Staging form: Breast, AJCC 8th Edition - Clinical stage from 02/10/2018: Stage IIA (cT2, cN0, cM0, G3, ER: Positive, PR: Negative, HER2: Positive) - Unsigned Staging comments: Staged at breast conference on 3.13.19 ypT0 ypN0  Radiation treatment dates:   09/13/2018 - 10/04/2018 Site/dose:   Left Breast / 42.56 Gy in 16 fractions  CHIEF COMPLAINT: Here for follow-up and surveillance of left breast cancer  Narrative:  The patient returns today for routine follow-up.  She last saw Dr. Lindi Adie on 09/30/18. She continues on Herceptin and Perjeta every 3 weeks. She will see Wilber Bihari, NP on 11/11/18. She reports the skin underneath her left breast has healed. She was using Neosporin but felt like it kept her skin too moist. She bought desitin ointment which she reports helped her heal well. She is now using Radiaplex daily to moisturize the radiation site.          ALLERGIES:  is allergic to codeine and demerol [meperidine].  Meds: Current Outpatient Medications  Medication Sig Dispense Refill  . amLODipine (NORVASC) 10 MG tablet Take 10 mg by mouth daily.     . clotrimazole-betamethasone (LOTRISONE) cream Apply 1 application topically 2 (two) times daily. 30 g 0  . hydrochlorothiazide (HYDRODIURIL) 12.5 MG tablet Take 12.5 mg by mouth daily.     . hydrOXYzine (ATARAX/VISTARIL) 50 MG tablet Take 50 mg by mouth 3 (three) times daily as needed for anxiety.     Marland Kitchen latanoprost (XALATAN) 0.005  % ophthalmic solution Place 1 drop into both eyes at bedtime.     . lidocaine-prilocaine (EMLA) cream Apply to affected area once 30 g 3  . LORazepam (ATIVAN) 0.5 MG tablet Take 1 tablet (0.5 mg total) by mouth at bedtime as needed for anxiety. 30 tablet 1  . buPROPion (WELLBUTRIN SR) 150 MG 12 hr tablet Take 1 tablet (150 mg total) by mouth daily for 3 days, THEN 1 tablet (150 mg total) 2 (two) times daily. (Patient not taking: Reported on 08/25/2018) 63 tablet 3  . nicotine (NICODERM CQ - DOSED IN MG/24 HOURS) 21 mg/24hr patch Place 1 patch (21 mg total) onto the skin daily. Apply 21 mg patch daily x 6 wk, then 29m patch daily x 2 wk, then 7 mg patch daily x 2 wk (Patient not taking: Reported on 08/25/2018) 14 patch 2  . ondansetron (ZOFRAN) 8 MG tablet Take 1 tablet (8 mg total) by mouth 2 (two) times daily as needed for refractory nausea / vomiting. (Patient not taking: Reported on 11/03/2018) 30 tablet 1  . pramipexole (MIRAPEX) 0.25 MG tablet Take 0.25-1 mg by mouth at bedtime.     . prochlorperazine (COMPAZINE) 10 MG tablet Take 1 tablet (10 mg total) by mouth every 6 (six) hours as needed (Nausea or vomiting). (Patient not taking: Reported on 11/03/2018) 30 tablet 1   No current facility-administered medications for this encounter.     Physical Findings: The patient is in no acute distress. Patient is alert and oriented.  height is _0  (1.6 m) and weight is 147 lb (66.7 kg). Her oral temperature is 98.2 F (36.8 C). Her blood pressure is 156/74 (abnormal) and her pulse is 69. Her respiration is 18 and oxygen saturation is 100%.    Residual hyperpigmentation over the left breast with resolving superficial peeling at the IM fold. No moist peeling.   Lab Findings: Lab Results  Component Value Date   WBC 11.6 (H) 09/30/2018   HGB 11.1 (L) 09/30/2018   HCT 33.2 (L) 09/30/2018   MCV 85.8 09/30/2018   PLT 380 09/30/2018    Radiographic Findings: No results found.  Impression/Plan:  Healing well from radiotherapy to the breast tissue.   Keep the IM fold dry and otherwise apply Vitamin E lotion or oil to the remainder of the breast twice a day for at least 2 more months for further healing.  I encouraged her to continue with yearly mammography and followup with medical oncology. I will see her back on an as-needed basis. I have encouraged her to call if she has any issues or concerns in the future. I wished her the very best.      Eppie Gibson, MD  This document serves as a record of services personally performed by Eppie Gibson, MD. It was created on her behalf by Rae Lips, a trained medical scribe. The creation of this record is based on the scribe's personal observations and the provider's statements to them. This document has been checked and approved by the attending provider.

## 2018-11-11 ENCOUNTER — Inpatient Hospital Stay: Payer: Medicare HMO

## 2018-11-11 ENCOUNTER — Encounter: Payer: Self-pay | Admitting: Adult Health

## 2018-11-11 ENCOUNTER — Inpatient Hospital Stay: Payer: Medicare HMO | Attending: Hematology and Oncology

## 2018-11-11 ENCOUNTER — Inpatient Hospital Stay (HOSPITAL_BASED_OUTPATIENT_CLINIC_OR_DEPARTMENT_OTHER): Payer: Medicare HMO | Admitting: Adult Health

## 2018-11-11 VITALS — BP 157/73 | HR 57 | Temp 97.5°F | Resp 17 | Ht 63.0 in | Wt 146.2 lb

## 2018-11-11 DIAGNOSIS — Z923 Personal history of irradiation: Secondary | ICD-10-CM | POA: Diagnosis not present

## 2018-11-11 DIAGNOSIS — Z170 Estrogen receptor positive status [ER+]: Secondary | ICD-10-CM | POA: Insufficient documentation

## 2018-11-11 DIAGNOSIS — F419 Anxiety disorder, unspecified: Secondary | ICD-10-CM

## 2018-11-11 DIAGNOSIS — I10 Essential (primary) hypertension: Secondary | ICD-10-CM | POA: Diagnosis not present

## 2018-11-11 DIAGNOSIS — Z803 Family history of malignant neoplasm of breast: Secondary | ICD-10-CM | POA: Diagnosis not present

## 2018-11-11 DIAGNOSIS — M129 Arthropathy, unspecified: Secondary | ICD-10-CM | POA: Insufficient documentation

## 2018-11-11 DIAGNOSIS — C50212 Malignant neoplasm of upper-inner quadrant of left female breast: Secondary | ICD-10-CM | POA: Diagnosis not present

## 2018-11-11 DIAGNOSIS — Z5112 Encounter for antineoplastic immunotherapy: Secondary | ICD-10-CM | POA: Insufficient documentation

## 2018-11-11 DIAGNOSIS — R5383 Other fatigue: Secondary | ICD-10-CM | POA: Insufficient documentation

## 2018-11-11 DIAGNOSIS — Z9221 Personal history of antineoplastic chemotherapy: Secondary | ICD-10-CM | POA: Diagnosis not present

## 2018-11-11 DIAGNOSIS — Z79811 Long term (current) use of aromatase inhibitors: Secondary | ICD-10-CM | POA: Insufficient documentation

## 2018-11-11 DIAGNOSIS — F17210 Nicotine dependence, cigarettes, uncomplicated: Secondary | ICD-10-CM

## 2018-11-11 DIAGNOSIS — R197 Diarrhea, unspecified: Secondary | ICD-10-CM | POA: Diagnosis not present

## 2018-11-11 DIAGNOSIS — Z800 Family history of malignant neoplasm of digestive organs: Secondary | ICD-10-CM | POA: Diagnosis not present

## 2018-11-11 DIAGNOSIS — E2839 Other primary ovarian failure: Secondary | ICD-10-CM

## 2018-11-11 DIAGNOSIS — Z95828 Presence of other vascular implants and grafts: Secondary | ICD-10-CM

## 2018-11-11 LAB — CMP (CANCER CENTER ONLY)
ALK PHOS: 75 U/L (ref 38–126)
ALT: 9 U/L (ref 0–44)
AST: 16 U/L (ref 15–41)
Albumin: 3.8 g/dL (ref 3.5–5.0)
Anion gap: 10 (ref 5–15)
BUN: 11 mg/dL (ref 8–23)
CO2: 23 mmol/L (ref 22–32)
CREATININE: 0.83 mg/dL (ref 0.44–1.00)
Calcium: 8.9 mg/dL (ref 8.9–10.3)
Chloride: 111 mmol/L (ref 98–111)
GFR, Est AFR Am: >60 mL/min (ref >60)
GFR, Estimated: >60 mL/min (ref >60)
Glucose, Bld: 108 mg/dL — ABNORMAL HIGH (ref 70–99)
Potassium: 3.7 mmol/L (ref 3.5–5.1)
SODIUM: 144 mmol/L (ref 135–145)
TOTAL PROTEIN: 6.8 g/dL (ref 6.5–8.1)
Total Bilirubin: 0.3 mg/dL (ref 0.3–1.2)

## 2018-11-11 LAB — CBC WITH DIFFERENTIAL (CANCER CENTER ONLY)
ABS IMMATURE GRANULOCYTES: 0.03 10*3/uL (ref 0.00–0.07)
Basophils Absolute: 0.1 10*3/uL (ref 0.0–0.1)
Basophils Relative: 1 %
EOS ABS: 0.1 10*3/uL (ref 0.0–0.5)
EOS PCT: 1 %
HEMATOCRIT: 32.9 % — AB (ref 36.0–46.0)
Hemoglobin: 10.8 g/dL — ABNORMAL LOW (ref 12.0–15.0)
IMMATURE GRANULOCYTES: 0 %
Lymphocytes Relative: 21 %
Lymphs Abs: 2.1 10*3/uL (ref 0.7–4.0)
MCH: 28.8 pg (ref 26.0–34.0)
MCHC: 32.8 g/dL (ref 30.0–36.0)
MCV: 87.7 fL (ref 80.0–100.0)
MONOS PCT: 9 %
Monocytes Absolute: 0.9 10*3/uL (ref 0.1–1.0)
Neutro Abs: 7 10*3/uL (ref 1.7–7.7)
Neutrophils Relative %: 68 %
Platelet Count: 351 10*3/uL (ref 150–400)
RBC: 3.75 MIL/uL — AB (ref 3.87–5.11)
RDW: 14.1 % (ref 11.5–15.5)
WBC: 10.2 10*3/uL (ref 4.0–10.5)
nRBC: 0 % (ref 0.0–0.2)

## 2018-11-11 MED ORDER — ACETAMINOPHEN 325 MG PO TABS
ORAL_TABLET | ORAL | Status: AC
  Filled 2018-11-11: qty 2

## 2018-11-11 MED ORDER — DIPHENHYDRAMINE HCL 25 MG PO CAPS
50.0000 mg | ORAL_CAPSULE | Freq: Once | ORAL | Status: AC
  Administered 2018-11-11: 50 mg via ORAL

## 2018-11-11 MED ORDER — SODIUM CHLORIDE 0.9 % IV SOLN
Freq: Once | INTRAVENOUS | Status: AC
  Administered 2018-11-11: 14:00:00 via INTRAVENOUS
  Filled 2018-11-11: qty 250

## 2018-11-11 MED ORDER — TRASTUZUMAB CHEMO 150 MG IV SOLR
6.0000 mg/kg | Freq: Once | INTRAVENOUS | Status: AC
  Administered 2018-11-11: 399 mg via INTRAVENOUS
  Filled 2018-11-11: qty 19

## 2018-11-11 MED ORDER — LETROZOLE 2.5 MG PO TABS: 2.5000 mg | ORAL_TABLET | Freq: Every day | ORAL | 1 refills | Status: DC

## 2018-11-11 MED ORDER — SODIUM CHLORIDE 0.9% FLUSH
10.0000 mL | Freq: Once | INTRAVENOUS | Status: AC
  Administered 2018-11-11: 10 mL
  Filled 2018-11-11: qty 10

## 2018-11-11 MED ORDER — SODIUM CHLORIDE 0.9% FLUSH
10.0000 mL | INTRAVENOUS | Status: DC | PRN
  Administered 2018-11-11: 10 mL
  Filled 2018-11-11: qty 10

## 2018-11-11 MED ORDER — ACETAMINOPHEN 325 MG PO TABS
650.0000 mg | ORAL_TABLET | Freq: Once | ORAL | Status: AC
  Administered 2018-11-11: 650 mg via ORAL

## 2018-11-11 MED ORDER — DIPHENHYDRAMINE HCL 25 MG PO CAPS
ORAL_CAPSULE | ORAL | Status: AC
  Filled 2018-11-11: qty 2

## 2018-11-11 MED ORDER — HEPARIN SOD (PORK) LOCK FLUSH 100 UNIT/ML IV SOLN
500.0000 [IU] | Freq: Once | INTRAVENOUS | Status: AC | PRN
  Administered 2018-11-11: 500 [IU]
  Filled 2018-11-11: qty 5

## 2018-11-11 MED ORDER — SODIUM CHLORIDE 0.9 % IV SOLN
420.0000 mg | Freq: Once | INTRAVENOUS | Status: AC
  Administered 2018-11-11: 420 mg via INTRAVENOUS
  Filled 2018-11-11: qty 14

## 2018-11-11 MED FILL — LORazepam 0.5 MG TABS: 0.5 | 30 days supply | Qty: 30 | Fill #1

## 2018-11-11 MED FILL — ONDANSETRON HCL 8 MG TABLET: 8 | 15 days supply | Qty: 30 | Fill #1

## 2018-11-11 MED FILL — LETROZOLE 2.5 MG TABLET: 2.5 | 30 days supply | Qty: 30 | Fill #0

## 2018-11-11 NOTE — Patient Instructions (Signed)
[facility] Discharge Instructions for Patients Receiving Chemotherapy  Today you received the following chemotherapy agents Trastuzumab (Herceptin) & Pertuzumab (Perjeta).  To help prevent nausea and vomiting after your treatment, we encourage you to take your nausea medication as prescribed.   If you develop nausea and vomiting that is not controlled by your nausea medication, call the clinic.   BELOW ARE SYMPTOMS THAT SHOULD BE REPORTED IMMEDIATELY:  *FEVER GREATER THAN 100.5 F  *CHILLS WITH OR WITHOUT FEVER  NAUSEA AND VOMITING THAT IS NOT CONTROLLED WITH YOUR NAUSEA MEDICATION  *UNUSUAL SHORTNESS OF BREATH  *UNUSUAL BRUISING OR BLEEDING  TENDERNESS IN MOUTH AND THROAT WITH OR WITHOUT PRESENCE OF ULCERS  *URINARY PROBLEMS  *BOWEL PROBLEMS  UNUSUAL RASH Items with * indicate a potential emergency and should be followed up as soon as possible.  Feel free to call the clinic should you have any questions or concerns. The clinic phone number is [phone].  Please show the [facility] at check-in to the Emergency Department and triage nurse.

## 2018-11-11 NOTE — Progress Notes (Signed)
[facility] Follow up:    Michelle Ebbs, MD [place] [address]   DIAGNOSIS: Cancer Staging Malignant neoplasm of upper-inner quadrant of left breast in female, estrogen receptor positive ([place]) Staging form: Breast, AJCC 8th Edition - Clinical stage from 02/10/2018: Stage IIA (cT2, cN0, cM0, G3, ER: Positive, PR: Negative, HER2: Positive) - Unsigned Staging comments: Staged at breast conference on 3.13.19   SUMMARY OF ONCOLOGIC HISTORY:   Malignant neoplasm of upper-inner quadrant of left breast in female, estrogen receptor positive ([place])   02/03/2018 Initial Diagnosis    Left breast palpable lump at 1130 position 2.3 cm, at 11 o'clock position 6 mm which was benign; biopsy revealed IDC grade 3 ER 30%, PR 0%, Ki-67 30%, HER-2 positive ratio 5.62, copy #16.3, axillary lymph node biopsy benign concordant, T2 N0 stage II a clinical stage AJCC 8    02/18/2018 Breast MRI    Solitary mass in left breast upper outer quadrant 2.5 cm.  No additional enhancement in the right breast.  No adenopathy    02/26/2018 - 04/15/2018 Neo-Adjuvant Chemotherapy    TCH Perjeta X 3 cycles (stopped early due to toxicities) followed by Herceptin Perjeta maintenance    06/18/2018 Breast MRI    Marked reduction in the abnormal enhancement in the upper inner left breast at the site of known cancer.  There is approximately 2.5 cm of residual linear non mass enhancement at the site of known cancer.    08/12/2018 Surgery    Left lumpectomy: No residual cancer identified 0/4 lymph nodes negative, complete pathologic response    09/14/2018 - 10/04/2018 Radiation Therapy    Adjuvant radiation therapy   Left Breast / 42.56 Gy in 16 fractions    11/11/2018 -  Anti-estrogen oral therapy    Letrozole daily     CURRENT THERAPY: Herceptin/Perjeta  INTERVAL HISTORY: [name] 72 y.o. female returns for evaluation prior to receiving Herceptin and Perjeta.  She is feeling  moderately well today.  She noted some nausea/vomiting over thanksgiving.  It resolved.  She has had some diarrhea this morning.  She completed radiation on 10/04/2018.  She is doing well.  Her skin has healed with the use of neosporin.  Her next echo is on 12/31.  She is fatigued, but otherwise well.     Patient Active Problem List   Diagnosis Date Noted  . Chemotherapy induced nausea and vomiting 04/22/2018  . Chemotherapy induced diarrhea 04/22/2018  . Port-A-Cath in place 02/26/2018  . Genetic testing 02/19/2018  . Family history of breast cancer   . Malignant neoplasm of upper-inner quadrant of left breast in female, estrogen receptor positive ([place]) 02/09/2018    is allergic to codeine and demerol [meperidine].  MEDICAL HISTORY: Past Medical History:  Diagnosis Date  . Anxiety   . Arthritis   . Breast cancer ([facility])   . Cough   . Family history of breast cancer   . Genetic testing 02/19/2018   Multi-Cancer panel (83 genes) @ [facility] - No pathogenic mutations detected  . GERD (gastroesophageal reflux disease)   . History of radiation therapy 09/13/18- 10/04/18   Left Breast 42.56 Gy in 16 fractions.   . Hypertension   . RLS (restless legs syndrome)     SURGICAL HISTORY: Past Surgical History:  Procedure Laterality Date  . ABDOMINAL HYSTERECTOMY     1970's due to ectopic pregnancy  . BREAST BIOPSY Left 2016  . BREAST LUMPECTOMY WITH RADIOACTIVE SEED AND SENTINEL LYMPH  NODE BIOPSY Left 08/12/2018   Procedure: LEFT BREAST LUMPECTOMY WITH RADIOACTIVE SEED AND SENTINEL LYMPH NODE BIOPSY;  Surgeon: Cornett, Thomas, MD;  Location: [facility];  Service: General;  Laterality: Left;  . BREAST SURGERY     cyst removal  . CARPOMETACARPEL SUSPENSION PLASTY Right 10/30/2016   Procedure: SUSPENSION PLASTY abductor pollicis longus transfer excision trapezium right;  Surgeon: Gary Kuzma, MD;  Location: [facility];  Service: Orthopedics;  Laterality: Right;   axillary block  SUSPENSION PLASTY abductor pollicis longus transfer excision trapezium right  . KIDNEY DONATION Left   . PORTACATH PLACEMENT Right 02/24/2018   Procedure: INSERTION OF RIGHT INTERNAL JUGULAR PORT-A-CATH WITH ULTRA SOUND GUIDANCE ERAS PATHWAY;  Surgeon: Cornett, Thomas, MD;  Location: [facility];  Service: General;  Laterality: Right;  . WRIST SURGERY     cyst removal    SOCIAL HISTORY: Social History   Socioeconomic History  . Marital status: Married    Spouse name: Not on file  . Number of children: Not on file  . Years of education: Not on file  . Highest education level: Not on file  Occupational History  . Not on file  Social Needs  . Financial resource strain: Not on file  . Food insecurity:    Worry: Not on file    Inability: Not on file  . Transportation needs:    Medical: No    Non-medical: No  Tobacco Use  . Smoking status: Current Every Day Smoker    Packs/day: 0.50    Years: 47.00    Pack years: 23.50    Types: Cigarettes  . Smokeless tobacco: Never Used  Substance and Sexual Activity  . Alcohol use: No  . Drug use: No  . Sexual activity: Not on file  Lifestyle  . Physical activity:    Days per week: Not on file    Minutes per session: Not on file  . Stress: Not on file  Relationships  . Social connections:    Talks on phone: Not on file    Gets together: Not on file    Attends religious service: Not on file    Active member of club or organization: Not on file    Attends meetings of clubs or organizations: Not on file    Relationship status: Not on file  . Intimate partner violence:    Fear of current or ex partner: No    Emotionally abused: No    Physically abused: No    Forced sexual activity: No  Other Topics Concern  . Not on file  Social History Narrative  . Not on file    FAMILY HISTORY: Family History  Problem Relation Age of Onset  . Liver cancer Mother        deceased 88  . Lung cancer Father 75       deceased 77;  smoker  . Stomach cancer Maternal Grandmother        deceased 45  . Cancer Paternal Uncle        deceased 75; unk. type  . Breast cancer Maternal Aunt        dx 70s; deceased 80s  . Breast cancer Maternal Aunt        dx 70s; deceased 80s  . Breast cancer Cousin        daughter of mat aunt with breast ca    Review of Systems  Constitutional: Positive for fatigue. Negative for appetite change, chills, diaphoresis, fever and unexpected weight   change.  HENT:   Negative for hearing loss, lump/mass and trouble swallowing.   Eyes: Negative for eye problems and icterus.  Respiratory: Negative for chest tightness, cough and shortness of breath.   Cardiovascular: Negative for chest pain, leg swelling and palpitations.  Gastrointestinal: Negative for abdominal distention, abdominal pain, constipation, nausea and vomiting.  Endocrine: Negative for hot flashes.  Genitourinary: Negative for difficulty urinating.   Musculoskeletal: Negative for arthralgias.  Skin: Negative for itching and rash.  Neurological: Negative for dizziness, extremity weakness, headaches and numbness.  Psychiatric/Behavioral: The patient is not nervous/anxious.       PHYSICAL EXAMINATION  ECOG PERFORMANCE STATUS: 1 - Symptomatic but completely ambulatory  Vitals:   11/11/18 1303  BP: (!) 157/73  Pulse: (!) 57  Resp: 17  Temp: (!) 97.5 F (36.4 C)  SpO2: 100%    Physical Exam Constitutional:      General: She is not in acute distress. HENT:     Head: Normocephalic.     Mouth/Throat:     Mouth: Mucous membranes are moist.     Pharynx: No oropharyngeal exudate.  Eyes:     General: No scleral icterus.    Pupils: Pupils are equal, round, and reactive to light.  Neck:     Musculoskeletal: Normal range of motion and neck supple.  Cardiovascular:     Rate and Rhythm: Normal rate and regular rhythm.     Heart sounds: Normal heart sounds.  Pulmonary:     Effort: Pulmonary effort is normal.     Breath  sounds: Normal breath sounds.  Abdominal:     General: Abdomen is flat. Bowel sounds are normal. There is no distension.     Palpations: Abdomen is soft.     Tenderness: There is no abdominal tenderness.  Musculoskeletal:        General: No swelling.  Lymphadenopathy:     Cervical: No cervical adenopathy.  Skin:    General: Skin is warm.     Capillary Refill: Capillary refill takes less than 2 seconds.  Neurological:     Mental Status: She is alert.  Psychiatric:        Mood and Affect: Mood normal.     LABORATORY DATA:  CBC    Component Value Date/Time   WBC 10.2 11/11/2018 1214   WBC 11.8 (H) 02/22/2018 1208   RBC 3.75 (L) 11/11/2018 1214   HGB 10.8 (L) 11/11/2018 1214   HCT 32.9 (L) 11/11/2018 1214   PLT 351 11/11/2018 1214   MCV 87.7 11/11/2018 1214   MCH 28.8 11/11/2018 1214   MCHC 32.8 11/11/2018 1214   RDW 14.1 11/11/2018 1214   LYMPHSABS 2.1 11/11/2018 1214   MONOABS 0.9 11/11/2018 1214   EOSABS 0.1 11/11/2018 1214   BASOSABS 0.1 11/11/2018 1214    CMP     Component Value Date/Time   NA 141 09/30/2018 0947   K 4.4 09/30/2018 0947   CL 107 09/30/2018 0947   CO2 24 09/30/2018 0947   GLUCOSE 92 09/30/2018 0947   BUN 15 09/30/2018 0947   CREATININE 0.94 09/30/2018 0947   CALCIUM 9.8 09/30/2018 0947   PROT 7.5 09/30/2018 0947   ALBUMIN 4.0 09/30/2018 0947   AST 18 09/30/2018 0947   ALT 10 09/30/2018 0947   ALKPHOS 88 09/30/2018 0947   BILITOT 0.5 09/30/2018 0947   GFRNONAA >60 09/30/2018 0947   GFRAA >60 09/30/2018 0947          ASSESSMENT and THERAPY PLAN:   Malignant   neoplasm of upper-inner quadrant of left breast in female, estrogen receptor positive ([facility]) 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, copy #16.3, axillary lymph node biopsy benign concordant, T2 N0 stage II a clinical stage AJCC  8  Treatment plan: 1.Neoadjuvant chemotherapy with TCH Perjeta x 3 cycles (discontinued early due to toxicities of severe diarrhea nausea and vomiting) 02/26/2018 to 04/15/2018 followed by Herceptin Perjeta maintenance for 1 year 2.followed by breast conserving surgery 08/12/2018: Pathologic complete response 3followed by adjuvant radiation completed on 10/04/2018 4.Followed by adjuvant antiestrogen therapy with letrozole for 5-7 years ------------------------------------------------------------------------------------------------------------------------ 08/12/2018: Left lumpectomy: Pathologic complete response, 0/4 lymph nodes negative Adjuvant radiation started 09/14/2018  Next echo scheduled 11/30/2018  Current treatment: Herceptin Perjeta maintenance every 3 weeks. Tolerating it extremely well. Monitoring closely for toxicities  She will start Letrozole as she has completed radiation over one month ago.  I sent the prescription in.  Reviewed benefits of Letrozole and common side effects.  She will get this from [facility] today.  Return to clinic every 3 weeks for Herceptin and Perjeta every 6 weeks for follow-up with Dr. Gudena   Orders Placed This Encounter  Procedures  . DG Bone Density    Standing Status:   Future    Standing Expiration Date:   11/11/2019    Order Specific Question:   Reason for Exam (SYMPTOM  OR DIAGNOSIS REQUIRED)    Answer:   estrogen deficiency    Order Specific Question:   Preferred imaging location?    Answer:   [facility]    All questions were answered. The patient knows to call the clinic with any problems, questions or concerns. We can certainly see the patient much sooner if necessary.  A total of (20) minutes of face-to-face time was spent with this patient with greater than 50% of that time in counseling and care-coordination.  This note was electronically signed. Lindsey C Causey, NP 11/11/2018 

## 2018-11-11 NOTE — Assessment & Plan Note (Addendum)
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, copy #16.3, axillary lymph node biopsy benign concordant, T2 N0 stage II a clinical stage AJCC 8  Treatment plan: 1.Neoadjuvant chemotherapy with TCH Perjeta x 3 cycles (discontinued early due to toxicities of severe diarrhea nausea and vomiting) 02/26/2018 to 04/15/2018 followed by Herceptin Perjeta maintenance for 1 year 2.followed by breast conserving surgery 08/12/2018: Pathologic complete response 51fllowed by adjuvant radiation completed on 10/04/2018 4.Followed by adjuvant antiestrogen therapy with letrozole for 5-7 years ------------------------------------------------------------------------------------------------------------------------ 08/12/2018: Left lumpectomy: Pathologic complete response, 0/4 lymph nodes negative Adjuvant radiation started 09/14/2018  Next echo scheduled 11/30/2018  Current treatment: Herceptin Perjeta maintenance every 3 weeks. Tolerating it extremely well. Monitoring closely for toxicities  She will start Letrozole as she has completed radiation over one month ago.  I sent the prescription in.  Reviewed benefits of Letrozole and common side effects.  [facility]  Return to clinic every 3 weeks for Herceptin and Perjeta every 6 weeks for follow-up with Dr. GLindi Adie

## 2018-11-12 ENCOUNTER — Telehealth: Payer: Self-pay | Admitting: Adult Health

## 2018-11-12 NOTE — Telephone Encounter (Signed)
Per 12/12 no los °

## 2018-11-16 ENCOUNTER — Telehealth: Payer: Self-pay

## 2018-11-16 NOTE — Telephone Encounter (Signed)
PT calling to report side effect from letrozole. Pt states that she started taking letrozole in the morning last thurs/fri. She started having a lot of dizziness, frequent headaches, over the weekend, weakness, and and mildy shortness of breath. Pt had to come home early from work yesterday due to dizziness. Pt would like to know if she can take it a different time and if she can take every other day for a while. Told pt that she needs to increase her hydration intake, switch to taking medication at night before bed, and to take it every other night for 2 weeks. If symptoms worsen or does not go away, told pt to contact our office in the next few weeks. Pt verbalize understanding.

## 2018-11-30 ENCOUNTER — Encounter (HOSPITAL_COMMUNITY): Payer: Medicare HMO | Admitting: Cardiology

## 2018-11-30 ENCOUNTER — Ambulatory Visit (HOSPITAL_COMMUNITY): Admission: RE | Admit: 2018-11-30 | Payer: Medicare HMO | Source: Ambulatory Visit

## 2018-12-02 ENCOUNTER — Inpatient Hospital Stay: Payer: Medicare HMO | Attending: Hematology and Oncology

## 2018-12-02 VITALS — BP 148/66 | HR 68 | Temp 97.8°F | Resp 16

## 2018-12-02 DIAGNOSIS — Z170 Estrogen receptor positive status [ER+]: Secondary | ICD-10-CM | POA: Diagnosis not present

## 2018-12-02 DIAGNOSIS — Z79818 Long term (current) use of other agents affecting estrogen receptors and estrogen levels: Secondary | ICD-10-CM | POA: Insufficient documentation

## 2018-12-02 DIAGNOSIS — C50212 Malignant neoplasm of upper-inner quadrant of left female breast: Secondary | ICD-10-CM | POA: Insufficient documentation

## 2018-12-02 DIAGNOSIS — R112 Nausea with vomiting, unspecified: Secondary | ICD-10-CM | POA: Diagnosis not present

## 2018-12-02 DIAGNOSIS — Z79899 Other long term (current) drug therapy: Secondary | ICD-10-CM | POA: Insufficient documentation

## 2018-12-02 DIAGNOSIS — Z5112 Encounter for antineoplastic immunotherapy: Secondary | ICD-10-CM | POA: Insufficient documentation

## 2018-12-02 DIAGNOSIS — R439 Unspecified disturbances of smell and taste: Secondary | ICD-10-CM | POA: Insufficient documentation

## 2018-12-02 MED ORDER — ACETAMINOPHEN 325 MG PO TABS
ORAL_TABLET | ORAL | Status: AC
  Filled 2018-12-02: qty 2

## 2018-12-02 MED ORDER — HEPARIN SOD (PORK) LOCK FLUSH 100 UNIT/ML IV SOLN
500.0000 [IU] | Freq: Once | INTRAVENOUS | Status: AC | PRN
  Administered 2018-12-02: 500 [IU]
  Filled 2018-12-02: qty 5

## 2018-12-02 MED ORDER — SODIUM CHLORIDE 0.9% FLUSH
10.0000 mL | INTRAVENOUS | Status: DC | PRN
  Administered 2018-12-02: 10 mL
  Filled 2018-12-02: qty 10

## 2018-12-02 MED ORDER — TRASTUZUMAB CHEMO 150 MG IV SOLR
6.0000 mg/kg | Freq: Once | INTRAVENOUS | Status: AC
  Administered 2018-12-02: 399 mg via INTRAVENOUS
  Filled 2018-12-02: qty 19

## 2018-12-02 MED ORDER — ACETAMINOPHEN 325 MG PO TABS
650.0000 mg | ORAL_TABLET | Freq: Once | ORAL | Status: AC
  Administered 2018-12-02: 650 mg via ORAL

## 2018-12-02 MED ORDER — SODIUM CHLORIDE 0.9 % IV SOLN
Freq: Once | INTRAVENOUS | Status: AC
  Administered 2018-12-02: 09:00:00 via INTRAVENOUS
  Filled 2018-12-02: qty 250

## 2018-12-02 MED ORDER — DIPHENHYDRAMINE HCL 25 MG PO CAPS
ORAL_CAPSULE | ORAL | Status: AC
  Filled 2018-12-02: qty 2

## 2018-12-02 MED ORDER — SODIUM CHLORIDE 0.9 % IV SOLN
420.0000 mg | Freq: Once | INTRAVENOUS | Status: AC
  Administered 2018-12-02: 420 mg via INTRAVENOUS
  Filled 2018-12-02: qty 14

## 2018-12-02 MED ORDER — DIPHENHYDRAMINE HCL 25 MG PO CAPS
50.0000 mg | ORAL_CAPSULE | Freq: Once | ORAL | Status: AC
  Administered 2018-12-02: 50 mg via ORAL

## 2018-12-02 NOTE — Progress Notes (Signed)
Per Dr. Lindi Adie OK to tx with no lab results today.  Pt refused to stay 30 minute post-Perjeta observation period. Knows to call with any problems. VSS; see flow sheet.

## 2018-12-02 NOTE — Patient Instructions (Signed)
[facility] Discharge Instructions for Patients Receiving Chemotherapy  Today you received the following chemotherapy agents: Trastuzumab (Herceptin) & Pertuzumab (Perjeta).  To help prevent nausea and vomiting after your treatment, we encourage you to take your nausea medication as prescribed.   If you develop nausea and vomiting that is not controlled by your nausea medication, call the clinic.   BELOW ARE SYMPTOMS THAT SHOULD BE REPORTED IMMEDIATELY:  *FEVER GREATER THAN 100.5 F  *CHILLS WITH OR WITHOUT FEVER  NAUSEA AND VOMITING THAT IS NOT CONTROLLED WITH YOUR NAUSEA MEDICATION  *UNUSUAL SHORTNESS OF BREATH  *UNUSUAL BRUISING OR BLEEDING  TENDERNESS IN MOUTH AND THROAT WITH OR WITHOUT PRESENCE OF ULCERS  *URINARY PROBLEMS  *BOWEL PROBLEMS  UNUSUAL RASH Items with * indicate a potential emergency and should be followed up as soon as possible.  Feel free to call the clinic should you have any questions or concerns. The clinic phone number is [phone].  Please show the [facility] at check-in to the Emergency Department and triage nurse.

## 2018-12-22 NOTE — Progress Notes (Signed)
Patient Care Team: [name], MD as PCP - General (Internal Medicine)  DIAGNOSIS:    ICD-10-CM   1. Malignant neoplasm of upper-inner quadrant of left breast in female, estrogen receptor positive ([place]) C50.212 ondansetron (ZOFRAN) 8 MG tablet   Z17.0 letrozole (FEMARA) 2.5 MG tablet  2. Chemotherapy induced nausea and vomiting R11.2 LORazepam (ATIVAN) 0.5 MG tablet   T45.1X5A     SUMMARY OF ONCOLOGIC HISTORY:   Malignant neoplasm of upper-inner quadrant of left breast in female, estrogen receptor positive ([place])   02/03/2018 Initial Diagnosis    Left breast palpable lump at 1130 position 2.3 cm, at 11 o'clock position 6 mm which was benign; biopsy revealed IDC grade 3 ER 30%, PR 0%, Ki-67 30%, HER-2 positive ratio 5.62, copy #16.3, axillary lymph node biopsy benign concordant, T2 N0 stage II a clinical stage AJCC 8    02/18/2018 Breast MRI    Solitary mass in left breast upper outer quadrant 2.5 cm.  No additional enhancement in the right breast.  No adenopathy    02/26/2018 - 04/15/2018 Neo-Adjuvant Chemotherapy    TCH Perjeta X 3 cycles (stopped early due to toxicities) followed by Herceptin Perjeta maintenance    06/18/2018 Breast MRI    Marked reduction in the abnormal enhancement in the upper inner left breast at the site of known cancer.  There is approximately 2.5 cm of residual linear non mass enhancement at the site of known cancer.    08/12/2018 Surgery    Left lumpectomy: No residual cancer identified 0/4 lymph nodes negative, complete pathologic response    09/14/2018 - 10/04/2018 Radiation Therapy    Adjuvant radiation therapy   Left Breast / 42.56 Gy in 16 fractions    11/11/2018 -  Anti-estrogen oral therapy    Letrozole 2.35m daily     CHIEF COMPLIANT: Follow-up of Herceptin Perjeta maintenance and letrozole   INTERVAL HISTORY: Michelle RANEYis a 73y.o. with above-mentioned history of HER-2 positive left breast cancer currently on adjuvant Herceptin and  Perjeta. She finished radiation on 10/04/18 and is currently on letrozole therapy. She presents to the clinic today with a friend. She is tolerating treatment well and reports nausea and denies diarrhea. She reports letrozole made her sick and so she switched to taking it every day at night and all symptoms have resided. She asked for a refill of Lorazepam to help her sleep. She reports a frequent runny nose. She reports a good appetite but still has a loss of taste. She had to reschedule her ECHO because she was sick and has one scheduled for 02/11/19.   REVIEW OF SYSTEMS:   Constitutional: Denies fevers, chills or abnormal weight loss (+) loss of taste  Eyes: Denies blurriness of vision Ears, nose, mouth, throat, and face: Denies mucositis or sore throat (+) rhinnorrhea   Respiratory: Denies cough, dyspnea or wheezes Cardiovascular: Denies palpitation, chest discomfort Gastrointestinal:  Denies heartburn or change in bowel habits (+) nausea Skin: Denies abnormal skin rashes Lymphatics: Denies new lymphadenopathy or easy bruising Neurological: Denies numbness, tingling or new weaknesses Behavioral/Psych: Mood is stable, no new changes  Extremities: No lower extremity edema Breast: denies any pain or lumps or nodules in either breasts All other systems were reviewed with the patient and are negative.  I have reviewed the past medical history, past surgical history, social history and family history with the patient and they are unchanged from previous note.  ALLERGIES:  is allergic to codeine and demerol [meperidine].  MEDICATIONS:  Current Outpatient Medications  Medication Sig Dispense Refill  . amLODipine (NORVASC) 10 MG tablet Take 10 mg by mouth daily.     Marland Kitchen buPROPion (WELLBUTRIN SR) 150 MG 12 hr tablet Take 1 tablet (150 mg total) by mouth daily for 3 days, THEN 1 tablet (150 mg total) 2 (two) times daily. (Patient not taking: Reported on 08/25/2018) 63 tablet 3  . hydrochlorothiazide  (HYDRODIURIL) 12.5 MG tablet Take 12.5 mg by mouth daily.     . hydrOXYzine (ATARAX/VISTARIL) 50 MG tablet Take 50 mg by mouth 3 (three) times daily as needed for anxiety.     Marland Kitchen latanoprost (XALATAN) 0.005 % ophthalmic solution Place 1 drop into both eyes at bedtime.     Marland Kitchen letrozole (FEMARA) 2.5 MG tablet Take 1 tablet (2.5 mg total) by mouth daily. 90 tablet 3  . lidocaine-prilocaine (EMLA) cream Apply to affected area once 30 g 3  . LORazepam (ATIVAN) 0.5 MG tablet Take 1 tablet (0.5 mg total) by mouth at bedtime as needed for anxiety. 30 tablet 3  . ondansetron (ZOFRAN) 8 MG tablet Take 1 tablet (8 mg total) by mouth 2 (two) times daily as needed for refractory nausea / vomiting. 30 tablet 3  . pramipexole (MIRAPEX) 0.25 MG tablet Take 0.25-1 mg by mouth at bedtime.     . prochlorperazine (COMPAZINE) 10 MG tablet Take 1 tablet (10 mg total) by mouth every 6 (six) hours as needed (Nausea or vomiting). 30 tablet 1   No current facility-administered medications for this visit.     PHYSICAL EXAMINATION: ECOG PERFORMANCE STATUS: 1 - Symptomatic but completely ambulatory  Vitals:   12/23/18 1116  BP: (!) 177/88  Pulse: (!) 55  Resp: 18  Temp: 97.7 F (36.5 C)  SpO2: 99%   Filed Weights   12/23/18 1116  Weight: 143 lb 1.6 oz (64.9 kg)    GENERAL: alert, no distress and comfortable SKIN: skin color, texture, turgor are normal, no rashes or significant lesions EYES: normal, Conjunctiva are pink and non-injected, sclera clear OROPHARYNX: no exudate, no erythema and lips, buccal mucosa, and tongue normal  NECK: supple, thyroid normal size, non-tender, without nodularity LYMPH: no palpable lymphadenopathy in the cervical, axillary or inguinal LUNGS: clear to auscultation and percussion with normal breathing effort HEART: regular rate & rhythm and no murmurs and no lower extremity edema ABDOMEN: abdomen soft, non-tender and normal bowel sounds MUSCULOSKELETAL: no cyanosis of digits and  no clubbing  NEURO: alert & oriented x 3 with fluent speech, no focal motor/sensory deficits EXTREMITIES: No lower extremity edema  LABORATORY DATA:  I have reviewed the data as listed CMP Latest Ref Rng & Units 12/23/2018 11/11/2018 09/30/2018  Glucose 70 - 99 mg/dL 112(H) 108(H) 92  BUN 8 - 23 mg/dL 13 11 15   Creatinine 0.44 - 1.00 mg/dL 0.85 0.83 0.94  Sodium 135 - 145 mmol/L 143 144 141  Potassium 3.5 - 5.1 mmol/L 4.1 3.7 4.4  Chloride 98 - 111 mmol/L 110 111 107  CO2 22 - 32 mmol/L 24 23 24   Calcium 8.9 - 10.3 mg/dL 9.2 8.9 9.8  Total Protein 6.5 - 8.1 g/dL 6.8 6.8 7.5  Total Bilirubin 0.3 - 1.2 mg/dL 0.4 0.3 0.5  Alkaline Phos 38 - 126 U/L 76 75 88  AST 15 - 41 U/L 20 16 18   ALT 0 - 44 U/L 12 9 10     Lab Results  Component Value Date   WBC 9.1 12/23/2018   HGB 10.5 (L)  12/23/2018   HCT 32.4 (L) 12/23/2018   MCV 87.6 12/23/2018   PLT 349 12/23/2018   NEUTROABS 6.2 12/23/2018    ASSESSMENT & PLAN:  Malignant neoplasm of upper-inner quadrant of left breast in female, estrogen receptor positive ([facility]) 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, copy #16.3, axillary lymph node biopsy benign concordant, T2 N0 stage II a clinical stage AJCC 8  Treatment plan: 1.Neoadjuvant chemotherapy with [place] Perjeta x 3cycles (discontinued early due to toxicities of severe diarrhea nausea and vomiting)02/26/2018 to 5/16/2019followed by Herceptin Perjeta maintenance for 1 year 2.followed by breast conserving surgery9/10/2018: Pathologic complete response 1fllowed by adjuvant radiation 09/14/2018-10/04/2018 4.Followed by adjuvant antiestrogen therapy with letrozole for 5-7 years started 11/11/2018 ------------------------------------------------------------------------------------------------------------------------ 08/12/2018: Left lumpectomy:  Pathologic complete response, 0/4 lymph nodes negative Adjuvant radiation started 09/14/2018  Current treatment: Herceptin Perjeta maintenance every 3 weeks. With letrozole 2.5 mg daily started 11/11/2018 Slight nausea for which she takes ondansetron.   Tolerating it extremely well.  She will completed the Herceptin Perjeta maintenance by March 2020.  Letrozole toxicities: Patient was nauseated initially but once she started taking it at bedtime the nausea went away.  She is doing quite effects from letrozole. Chemotherapy-induced anemia: Hemoglobin is still trending down.  We will continue to watch and monitor this.  Insomnia: I renewed her prescription for lorazepam. Monitoring closely for toxicities  Return to clinic every 3 weeks for Herceptin and Perjeta every 6 weeks for follow-up with me    No orders of the defined types were placed in this encounter.  The patient has a good understanding of the overall plan. she agrees with it. she will call with any problems that may develop before the next visit here.  GNicholas Lose MD 12/23/2018  IJulious OkaDorshimer am acting as scribe for Dr. VNicholas Lose  I have reviewed the above documentation for accuracy and completeness, and I agree with the above.

## 2018-12-23 ENCOUNTER — Inpatient Hospital Stay: Payer: Medicare HMO

## 2018-12-23 ENCOUNTER — Telehealth: Payer: Self-pay | Admitting: Hematology and Oncology

## 2018-12-23 ENCOUNTER — Inpatient Hospital Stay (HOSPITAL_BASED_OUTPATIENT_CLINIC_OR_DEPARTMENT_OTHER): Payer: Medicare HMO | Admitting: Hematology and Oncology

## 2018-12-23 VITALS — BP 177/72 | HR 50

## 2018-12-23 DIAGNOSIS — C50212 Malignant neoplasm of upper-inner quadrant of left female breast: Secondary | ICD-10-CM

## 2018-12-23 DIAGNOSIS — Z170 Estrogen receptor positive status [ER+]: Principal | ICD-10-CM

## 2018-12-23 DIAGNOSIS — Z5112 Encounter for antineoplastic immunotherapy: Secondary | ICD-10-CM | POA: Diagnosis not present

## 2018-12-23 DIAGNOSIS — Z79899 Other long term (current) drug therapy: Secondary | ICD-10-CM

## 2018-12-23 DIAGNOSIS — T451X5A Adverse effect of antineoplastic and immunosuppressive drugs, initial encounter: Secondary | ICD-10-CM

## 2018-12-23 DIAGNOSIS — R112 Nausea with vomiting, unspecified: Secondary | ICD-10-CM | POA: Diagnosis not present

## 2018-12-23 DIAGNOSIS — Z79818 Long term (current) use of other agents affecting estrogen receptors and estrogen levels: Secondary | ICD-10-CM

## 2018-12-23 DIAGNOSIS — R439 Unspecified disturbances of smell and taste: Secondary | ICD-10-CM

## 2018-12-23 LAB — CMP (CANCER CENTER ONLY)
ALBUMIN: 3.8 g/dL (ref 3.5–5.0)
ALT: 12 U/L (ref 0–44)
AST: 20 U/L (ref 15–41)
Alkaline Phosphatase: 76 U/L (ref 38–126)
Anion gap: 9 (ref 5–15)
BUN: 13 mg/dL (ref 8–23)
CO2: 24 mmol/L (ref 22–32)
Calcium: 9.2 mg/dL (ref 8.9–10.3)
Chloride: 110 mmol/L (ref 98–111)
Creatinine: 0.85 mg/dL (ref 0.44–1.00)
GFR, Est AFR Am: >60 mL/min (ref >60)
GFR, Estimated: >60 mL/min (ref >60)
Glucose, Bld: 112 mg/dL — ABNORMAL HIGH (ref 70–99)
Potassium: 4.1 mmol/L (ref 3.5–5.1)
SODIUM: 143 mmol/L (ref 135–145)
Total Bilirubin: 0.4 mg/dL (ref 0.3–1.2)
Total Protein: 6.8 g/dL (ref 6.5–8.1)

## 2018-12-23 LAB — CBC WITH DIFFERENTIAL (CANCER CENTER ONLY)
Abs Immature Granulocytes: 0.03 10*3/uL (ref 0.00–0.07)
BASOS ABS: 0 10*3/uL (ref 0.0–0.1)
Basophils Relative: 0 %
Eosinophils Absolute: 0.1 10*3/uL (ref 0.0–0.5)
Eosinophils Relative: 1 %
HCT: 32.4 % — ABNORMAL LOW (ref 36.0–46.0)
Hemoglobin: 10.5 g/dL — ABNORMAL LOW (ref 12.0–15.0)
Immature Granulocytes: 0 %
Lymphocytes Relative: 23 %
Lymphs Abs: 2.1 10*3/uL (ref 0.7–4.0)
MCH: 28.4 pg (ref 26.0–34.0)
MCHC: 32.4 g/dL (ref 30.0–36.0)
MCV: 87.6 fL (ref 80.0–100.0)
Monocytes Absolute: 0.7 10*3/uL (ref 0.1–1.0)
Monocytes Relative: 7 %
NEUTROS ABS: 6.2 10*3/uL (ref 1.7–7.7)
Neutrophils Relative %: 69 %
Platelet Count: 349 10*3/uL (ref 150–400)
RBC: 3.7 MIL/uL — AB (ref 3.87–5.11)
RDW: 14.2 % (ref 11.5–15.5)
WBC Count: 9.1 10*3/uL (ref 4.0–10.5)
nRBC: 0 % (ref 0.0–0.2)

## 2018-12-23 MED ORDER — DIPHENHYDRAMINE HCL 25 MG PO CAPS
ORAL_CAPSULE | ORAL | Status: AC
  Filled 2018-12-23: qty 2

## 2018-12-23 MED ORDER — SODIUM CHLORIDE 0.9 % IV SOLN
Freq: Once | INTRAVENOUS | Status: AC
  Administered 2018-12-23: 12:00:00 via INTRAVENOUS
  Filled 2018-12-23: qty 250

## 2018-12-23 MED ORDER — DIPHENHYDRAMINE HCL 25 MG PO CAPS
50.0000 mg | ORAL_CAPSULE | Freq: Once | ORAL | Status: AC
  Administered 2018-12-23: 50 mg via ORAL

## 2018-12-23 MED ORDER — LORAZEPAM 0.5 MG PO TABS: 0.5000 mg | ORAL_TABLET | Freq: Every evening | ORAL | 3 refills | Status: DC | PRN

## 2018-12-23 MED ORDER — HEPARIN SOD (PORK) LOCK FLUSH 100 UNIT/ML IV SOLN
500.0000 [IU] | Freq: Once | INTRAVENOUS | Status: AC | PRN
  Administered 2018-12-23: 500 [IU]
  Filled 2018-12-23: qty 5

## 2018-12-23 MED ORDER — ACETAMINOPHEN 325 MG PO TABS
ORAL_TABLET | ORAL | Status: AC
  Filled 2018-12-23: qty 2

## 2018-12-23 MED ORDER — LETROZOLE 2.5 MG PO TABS: 2.5000 mg | ORAL_TABLET | Freq: Every day | ORAL | 3 refills | Status: DC

## 2018-12-23 MED ORDER — ONDANSETRON HCL 8 MG PO TABS: 8.0000 mg | ORAL_TABLET | Freq: Two times a day (BID) | ORAL | 3 refills | Status: DC | PRN

## 2018-12-23 MED ORDER — ACETAMINOPHEN 325 MG PO TABS
650.0000 mg | ORAL_TABLET | Freq: Once | ORAL | Status: AC
  Administered 2018-12-23: 650 mg via ORAL

## 2018-12-23 MED ORDER — SODIUM CHLORIDE 0.9 % IV SOLN
420.0000 mg | Freq: Once | INTRAVENOUS | Status: AC
  Administered 2018-12-23: 420 mg via INTRAVENOUS
  Filled 2018-12-23: qty 14

## 2018-12-23 MED ORDER — SODIUM CHLORIDE 0.9% FLUSH
10.0000 mL | INTRAVENOUS | Status: DC | PRN
  Administered 2018-12-23: 10 mL
  Filled 2018-12-23: qty 10

## 2018-12-23 MED ORDER — TRASTUZUMAB CHEMO 150 MG IV SOLR
6.0000 mg/kg | Freq: Once | INTRAVENOUS | Status: AC
  Administered 2018-12-23: 399 mg via INTRAVENOUS
  Filled 2018-12-23: qty 19

## 2018-12-23 MED FILL — ONDANSETRON HCL 8 MG TABLET: 8 | 15 days supply | Qty: 30 | Fill #0

## 2018-12-23 MED FILL — LETROZOLE 2.5 MG TABLET: 2.5 | 90 days supply | Qty: 90 | Fill #0

## 2018-12-23 MED FILL — LORazepam 0.5 MG TABS: 0.5 | 30 days supply | Qty: 30 | Fill #0

## 2018-12-23 NOTE — Progress Notes (Signed)
Pt scheduled for march echo.  No echo since 9/19.  Ok to proceed.    Pt refused to wait 30 minutes post perjeta

## 2018-12-23 NOTE — Assessment & Plan Note (Addendum)
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, copy #16.3, axillary lymph node biopsy benign concordant, T2 N0 stage II a clinical stage AJCC 8  Treatment plan: 1.Neoadjuvant chemotherapy with [place] Perjeta x 3cycles (discontinued early due to toxicities of severe diarrhea nausea and vomiting)02/26/2018 to 5/16/2019followed by Herceptin Perjeta maintenance for 1 year 2.followed by breast conserving surgery9/10/2018: Pathologic complete response 46fllowed by adjuvant radiation 09/14/2018-10/04/2018 4.Followed by adjuvant antiestrogen therapy with letrozole for 5-7 years started 11/11/2018 ------------------------------------------------------------------------------------------------------------------------ 08/12/2018: Left lumpectomy: Pathologic complete response, 0/4 lymph nodes negative Adjuvant radiation started 09/14/2018  Current treatment: Herceptin Perjeta maintenance every 3 weeks. With letrozole 2.5 mg daily started 11/11/2018 Slight nausea for which she takes ondansetron.   Tolerating it extremely well.  She will completed the Herceptin Perjeta maintenance by March 2020.  Letrozole toxicities: Patient was nauseated initially but once she started taking it at bedtime the nausea went away.  She is doing quite effects from letrozole.  Insomnia: I renewed her prescription for lorazepam. Monitoring closely for toxicities  Return to clinic every 3 weeks for Herceptin and Perjeta every 6 weeks for follow-up with me

## 2018-12-23 NOTE — Patient Instructions (Signed)
[facility] Discharge Instructions for Patients Receiving Chemotherapy  Today you received the following chemotherapy agents: Trastuzumab (Herceptin) & Pertuzumab (Perjeta).  To help prevent nausea and vomiting after your treatment, we encourage you to take your nausea medication as prescribed.   If you develop nausea and vomiting that is not controlled by your nausea medication, call the clinic.   BELOW ARE SYMPTOMS THAT SHOULD BE REPORTED IMMEDIATELY:  *FEVER GREATER THAN 100.5 F  *CHILLS WITH OR WITHOUT FEVER  NAUSEA AND VOMITING THAT IS NOT CONTROLLED WITH YOUR NAUSEA MEDICATION  *UNUSUAL SHORTNESS OF BREATH  *UNUSUAL BRUISING OR BLEEDING  TENDERNESS IN MOUTH AND THROAT WITH OR WITHOUT PRESENCE OF ULCERS  *URINARY PROBLEMS  *BOWEL PROBLEMS  UNUSUAL RASH Items with * indicate a potential emergency and should be followed up as soon as possible.  Feel free to call the clinic should you have any questions or concerns. The clinic phone number is [phone].  Please show the [place] at check-in to the Emergency Department and triage nurse.

## 2018-12-23 NOTE — Telephone Encounter (Signed)
No los °

## 2019-01-13 ENCOUNTER — Inpatient Hospital Stay: Payer: Medicare HMO

## 2019-01-13 ENCOUNTER — Inpatient Hospital Stay: Payer: Medicare HMO | Attending: Hematology and Oncology

## 2019-01-13 ENCOUNTER — Other Ambulatory Visit: Payer: Self-pay | Admitting: Oncology

## 2019-01-13 VITALS — BP 163/83 | HR 67 | Temp 98.1°F | Resp 18

## 2019-01-13 DIAGNOSIS — J069 Acute upper respiratory infection, unspecified: Secondary | ICD-10-CM | POA: Diagnosis not present

## 2019-01-13 DIAGNOSIS — Z170 Estrogen receptor positive status [ER+]: Secondary | ICD-10-CM | POA: Diagnosis not present

## 2019-01-13 DIAGNOSIS — F17210 Nicotine dependence, cigarettes, uncomplicated: Secondary | ICD-10-CM | POA: Insufficient documentation

## 2019-01-13 DIAGNOSIS — R630 Anorexia: Secondary | ICD-10-CM | POA: Diagnosis not present

## 2019-01-13 DIAGNOSIS — E860 Dehydration: Secondary | ICD-10-CM | POA: Diagnosis not present

## 2019-01-13 DIAGNOSIS — R110 Nausea: Secondary | ICD-10-CM | POA: Insufficient documentation

## 2019-01-13 DIAGNOSIS — I10 Essential (primary) hypertension: Secondary | ICD-10-CM | POA: Insufficient documentation

## 2019-01-13 DIAGNOSIS — Z5112 Encounter for antineoplastic immunotherapy: Secondary | ICD-10-CM | POA: Insufficient documentation

## 2019-01-13 DIAGNOSIS — Z803 Family history of malignant neoplasm of breast: Secondary | ICD-10-CM | POA: Insufficient documentation

## 2019-01-13 DIAGNOSIS — C50212 Malignant neoplasm of upper-inner quadrant of left female breast: Secondary | ICD-10-CM | POA: Diagnosis not present

## 2019-01-13 DIAGNOSIS — F419 Anxiety disorder, unspecified: Secondary | ICD-10-CM | POA: Insufficient documentation

## 2019-01-13 DIAGNOSIS — Z800 Family history of malignant neoplasm of digestive organs: Secondary | ICD-10-CM | POA: Insufficient documentation

## 2019-01-13 MED ORDER — ACETAMINOPHEN 325 MG PO TABS
ORAL_TABLET | ORAL | Status: AC
  Filled 2019-01-13: qty 2

## 2019-01-13 MED ORDER — DIPHENHYDRAMINE HCL 25 MG PO CAPS
50.0000 mg | ORAL_CAPSULE | Freq: Once | ORAL | Status: AC
  Administered 2019-01-13: 50 mg via ORAL

## 2019-01-13 MED ORDER — HEPARIN SOD (PORK) LOCK FLUSH 100 UNIT/ML IV SOLN
500.0000 [IU] | Freq: Once | INTRAVENOUS | Status: AC | PRN
  Administered 2019-01-13: 500 [IU]
  Filled 2019-01-13: qty 5

## 2019-01-13 MED ORDER — SODIUM CHLORIDE 0.9% FLUSH
10.0000 mL | INTRAVENOUS | Status: DC | PRN
  Administered 2019-01-13: 10 mL
  Filled 2019-01-13: qty 10

## 2019-01-13 MED ORDER — SODIUM CHLORIDE 0.9 % IV SOLN
Freq: Once | INTRAVENOUS | Status: AC
  Administered 2019-01-13: 10:00:00 via INTRAVENOUS
  Filled 2019-01-13: qty 250

## 2019-01-13 MED ORDER — ACETAMINOPHEN 325 MG PO TABS
650.0000 mg | ORAL_TABLET | Freq: Once | ORAL | Status: AC
  Administered 2019-01-13: 650 mg via ORAL

## 2019-01-13 MED ORDER — TRASTUZUMAB CHEMO 150 MG IV SOLR
6.0000 mg/kg | Freq: Once | INTRAVENOUS | Status: AC
  Administered 2019-01-13: 399 mg via INTRAVENOUS
  Filled 2019-01-13: qty 19

## 2019-01-13 MED ORDER — SODIUM CHLORIDE 0.9 % IV SOLN
420.0000 mg | Freq: Once | INTRAVENOUS | Status: AC
  Administered 2019-01-13: 420 mg via INTRAVENOUS
  Filled 2019-01-13: qty 14

## 2019-01-13 MED ORDER — DIPHENHYDRAMINE HCL 25 MG PO CAPS
ORAL_CAPSULE | ORAL | Status: AC
  Filled 2019-01-13: qty 2

## 2019-01-13 NOTE — Progress Notes (Signed)
Ok to treat today without CBC/CMP per Dr. Jana Hakim (in Dr. Geralyn Flash absence). Kennith Center, Pharm.D., CPP 01/13/2019@9 :34 AM

## 2019-01-13 NOTE — Patient Instructions (Signed)
[facility] Discharge Instructions for Patients Receiving Chemotherapy  Today you received the following chemotherapy agents Herceptin and Perjeta.   To help prevent nausea and vomiting after your treatment, we encourage you to take your nausea medication as directed.   If you develop nausea and vomiting that is not controlled by your nausea medication, call the clinic.   BELOW ARE SYMPTOMS THAT SHOULD BE REPORTED IMMEDIATELY:  *FEVER GREATER THAN 100.5 F  *CHILLS WITH OR WITHOUT FEVER  NAUSEA AND VOMITING THAT IS NOT CONTROLLED WITH YOUR NAUSEA MEDICATION  *UNUSUAL SHORTNESS OF BREATH  *UNUSUAL BRUISING OR BLEEDING  TENDERNESS IN MOUTH AND THROAT WITH OR WITHOUT PRESENCE OF ULCERS  *URINARY PROBLEMS  *BOWEL PROBLEMS  UNUSUAL RASH Items with * indicate a potential emergency and should be followed up as soon as possible.  Feel free to call the clinic should you have any questions or concerns. The clinic phone number is [phone].  Please show the CHEMO ALERT CARD at check-in to the Emergency Department and triage nurse.   

## 2019-01-13 NOTE — Progress Notes (Signed)
No labs today per MD Magrinat

## 2019-01-13 NOTE — Progress Notes (Signed)
Pt declined to stay for 30-minute observation  

## 2019-01-18 ENCOUNTER — Inpatient Hospital Stay: Payer: Medicare HMO

## 2019-01-18 ENCOUNTER — Telehealth: Payer: Self-pay

## 2019-01-18 ENCOUNTER — Inpatient Hospital Stay (HOSPITAL_BASED_OUTPATIENT_CLINIC_OR_DEPARTMENT_OTHER): Payer: Medicare HMO | Admitting: Medical

## 2019-01-18 VITALS — BP 176/79 | HR 65 | Temp 98.4°F | Resp 17 | Ht 63.0 in | Wt 142.0 lb

## 2019-01-18 DIAGNOSIS — R630 Anorexia: Secondary | ICD-10-CM | POA: Diagnosis not present

## 2019-01-18 DIAGNOSIS — I10 Essential (primary) hypertension: Secondary | ICD-10-CM

## 2019-01-18 DIAGNOSIS — C50212 Malignant neoplasm of upper-inner quadrant of left female breast: Secondary | ICD-10-CM | POA: Diagnosis not present

## 2019-01-18 DIAGNOSIS — Z5112 Encounter for antineoplastic immunotherapy: Secondary | ICD-10-CM | POA: Diagnosis not present

## 2019-01-18 DIAGNOSIS — Z170 Estrogen receptor positive status [ER+]: Secondary | ICD-10-CM

## 2019-01-18 DIAGNOSIS — Z95828 Presence of other vascular implants and grafts: Secondary | ICD-10-CM

## 2019-01-18 DIAGNOSIS — Z803 Family history of malignant neoplasm of breast: Secondary | ICD-10-CM

## 2019-01-18 DIAGNOSIS — F17210 Nicotine dependence, cigarettes, uncomplicated: Secondary | ICD-10-CM

## 2019-01-18 DIAGNOSIS — R110 Nausea: Secondary | ICD-10-CM

## 2019-01-18 DIAGNOSIS — E860 Dehydration: Secondary | ICD-10-CM

## 2019-01-18 DIAGNOSIS — Z800 Family history of malignant neoplasm of digestive organs: Secondary | ICD-10-CM

## 2019-01-18 DIAGNOSIS — F419 Anxiety disorder, unspecified: Secondary | ICD-10-CM

## 2019-01-18 DIAGNOSIS — J069 Acute upper respiratory infection, unspecified: Secondary | ICD-10-CM

## 2019-01-18 LAB — CMP (CANCER CENTER ONLY)
ALT: 7 U/L (ref 0–44)
AST: 14 U/L — AB (ref 15–41)
Albumin: 3.8 g/dL (ref 3.5–5.0)
Alkaline Phosphatase: 90 U/L (ref 38–126)
Anion gap: 8 (ref 5–15)
BUN: 11 mg/dL (ref 8–23)
CHLORIDE: 108 mmol/L (ref 98–111)
CO2: 26 mmol/L (ref 22–32)
Calcium: 9.1 mg/dL (ref 8.9–10.3)
Creatinine: 0.98 mg/dL (ref 0.44–1.00)
GFR, Est AFR Am: >60 mL/min (ref >60)
GFR, Estimated: 58 mL/min — ABNORMAL LOW (ref >60)
Glucose, Bld: 98 mg/dL (ref 70–99)
Potassium: 4.2 mmol/L (ref 3.5–5.1)
Sodium: 142 mmol/L (ref 135–145)
Total Bilirubin: 0.3 mg/dL (ref 0.3–1.2)
Total Protein: 7.1 g/dL (ref 6.5–8.1)

## 2019-01-18 LAB — CBC WITH DIFFERENTIAL (CANCER CENTER ONLY)
ABS IMMATURE GRANULOCYTES: 0.04 10*3/uL (ref 0.00–0.07)
BASOS ABS: 0.1 10*3/uL (ref 0.0–0.1)
Basophils Relative: 0 %
Eosinophils Absolute: 0.2 10*3/uL (ref 0.0–0.5)
Eosinophils Relative: 1 %
HCT: 33.8 % — ABNORMAL LOW (ref 36.0–46.0)
Hemoglobin: 11.1 g/dL — ABNORMAL LOW (ref 12.0–15.0)
Immature Granulocytes: 0 %
Lymphocytes Relative: 23 %
Lymphs Abs: 2.9 10*3/uL (ref 0.7–4.0)
MCH: 29 pg (ref 26.0–34.0)
MCHC: 32.8 g/dL (ref 30.0–36.0)
MCV: 88.3 fL (ref 80.0–100.0)
Monocytes Absolute: 1 10*3/uL (ref 0.1–1.0)
Monocytes Relative: 8 %
NEUTROS ABS: 8.4 10*3/uL — AB (ref 1.7–7.7)
NEUTROS PCT: 68 %
Platelet Count: 373 10*3/uL (ref 150–400)
RBC: 3.83 MIL/uL — ABNORMAL LOW (ref 3.87–5.11)
RDW: 13.8 % (ref 11.5–15.5)
WBC Count: 12.5 10*3/uL — ABNORMAL HIGH (ref 4.0–10.5)
nRBC: 0 % (ref 0.0–0.2)

## 2019-01-18 MED ORDER — HEPARIN SOD (PORK) LOCK FLUSH 100 UNIT/ML IV SOLN
500.0000 [IU] | Freq: Once | INTRAVENOUS | Status: AC
  Administered 2019-01-18: 500 [IU]
  Filled 2019-01-18: qty 5

## 2019-01-18 MED ORDER — SODIUM CHLORIDE 0.9 % IV SOLN
Freq: Once | INTRAVENOUS | Status: AC
  Administered 2019-01-18: 15:00:00 via INTRAVENOUS
  Filled 2019-01-18: qty 250

## 2019-01-18 MED ORDER — ONDANSETRON HCL 4 MG/2ML IJ SOLN
4.0000 mg | Freq: Once | INTRAMUSCULAR | Status: AC
  Administered 2019-01-18: 4 mg via INTRAVENOUS

## 2019-01-18 MED ORDER — SODIUM CHLORIDE 0.9% FLUSH
10.0000 mL | Freq: Once | INTRAVENOUS | Status: AC
  Administered 2019-01-18: 10 mL
  Filled 2019-01-18: qty 10

## 2019-01-18 MED ORDER — AZITHROMYCIN 250 MG PO TABS: ORAL_TABLET | ORAL | 0 refills | Status: DC

## 2019-01-18 MED ORDER — ONDANSETRON HCL 4 MG/2ML IJ SOLN
INTRAMUSCULAR | Status: AC
  Filled 2019-01-18: qty 2

## 2019-01-18 MED FILL — AZITHROMYCIN 250 MG TABLET: 250 | 5 days supply | Qty: 6 | Fill #0

## 2019-01-18 NOTE — Patient Instructions (Addendum)
Return to Work [employer] was treated at our facility. Injury or illness was: Not work-related.  Return to work  Employee may return to work on 01/20/2019  Health care provider name (printed): Sandi Mealy, MHS, PA-C   Health care provider (signature): _________________________________________  Date: 01/18/2019

## 2019-01-18 NOTE — Telephone Encounter (Signed)
Pt calling today to report that she is not feeling well for 3-4 days now. Pt has had a fever a few days ago, severe fatigue, sob, productive thick yellow cough, sinus congestion, decreased appetite, and dizziness d/t lack of hydration. Pt had herceptin/perjeta last Thursday 2/13. Pt denies n/v/d at this time. Pt states that she has only been drinking, no more than 16-20oz  of fluids per day. Currently, pt is afebrile. Have tried OTC medication to manage symptoms with for 3 days with no relief.  Told pt that she will need to come in today for lab work and to see SM. Pt may need IVF's if found to be dehydrated. Pt confirmed time/date of today's appt. No further needs at this time.   Will notify and alert SM staff of pt appt today.

## 2019-01-18 NOTE — Progress Notes (Signed)
Pt given 1L NS IV today with IV zofran.  Tolerated well.  Pt able to eat and drink during visit, reports feeling a bit better at end of infusion.  V/U of d/c instructions to follow up as needed within the next few days.

## 2019-01-21 NOTE — Progress Notes (Signed)
[facility] Progress Note   [name] [id number] September 20, 1946 73 y.o.  [name] is managed by Dr. Nicholas Lose  Actively treated with chemotherapy/immunotherapy/hormonal therapy: yes  Current therapy: Herceptin and Perjeta  Last treated: 01/13/2019 (cycle 16)  Next scheduled appointment with provider: 02/03/2019  Assessment: Plan:    Anorexia - Plan: 0.9 %  sodium chloride infusion  Dehydration - Plan: 0.9 %  sodium chloride infusion  Nausea without vomiting - Plan: ondansetron (ZOFRAN) injection 4 mg  Port-A-Cath in place - Plan: heparin lock flush 100 unit/mL, sodium chloride flush (NS) 0.9 % injection 10 mL  Malignant neoplasm of upper-inner quadrant of left breast in female, estrogen receptor positive ([place]) - Plan: heparin lock flush 100 unit/mL, sodium chloride flush (NS) 0.9 % injection 10 mL  Upper respiratory tract infection, unspecified type - Plan: azithromycin (ZITHROMAX) 250 MG tablet   Anorexia and dehydration: The patient was given 1 L of normal saline IV today.    Nausea: Patient was given Zofran 4 mg IV x1  ER positive malignant neoplasm of the left breast: The patient continues to be followed by Dr. Nicholas Lose and is status post cycle 16 of Herceptin and Perjeta which was dosed on 01/13/2019.  She is scheduled to be seen in follow-up on 02/03/2019.  URI: The patient was given a prescription for a Z-Pak today.  Please see After Visit Summary for patient specific instructions.  Future Appointments  Date Time Provider [place]  02/03/2019  9:30 AM [facility] ONLY [facility] None  02/03/2019  9:45 AM [facility] FLUSH [facility] None  02/03/2019 10:15 AM Nicholas Lose, MD [facility] None  02/03/2019 11:00 AM [facility] INFUSION [facility] None  02/11/2019 10:00 AM [facility] ECHO 1-BUZZ [facility]  02/11/2019 11:00 AM Larey Dresser, MD [facility] None  02/24/2019  9:30 AM [facility] INFUSION [facility] None    No orders  of the defined types were placed in this encounter.      Subjective:   Patient ID:  [name] is a 73 y.o. (DOB 04/15/1946) female.  Chief Complaint:  Chief Complaint  Patient presents with  . Fatigue    HPI [name]   is a 73 year old female with a diagnosis of an ER positive malignant neoplasm of the left breast who is managed by Dr. Nicholas Lose and is status post cycle 16 of Herceptin and Perjeta which was dosed on 01/13/2019.  She presents to the office today with a report of fatigue, dizziness, headaches, anorexia, increased somnolence, feelings of sadness due to her fatigue, recent fever of 101.0, positional dizziness, and a productive cough with yellowish sputum.  Medications: I have reviewed the patient's current medications.  Allergies:  Allergies  Allergen Reactions  . Codeine Nausea And Vomiting  . Demerol [Meperidine] Nausea And Vomiting    Past Medical History:  Diagnosis Date  . Anxiety   . Arthritis   . Breast cancer ([place])   . Cough   . Family history of breast cancer   . Genetic testing 02/19/2018   Multi-Cancer panel (83 genes) @ [facility] - No pathogenic mutations detected  . GERD (gastroesophageal reflux disease)   . History of radiation therapy 09/13/18- 10/04/18   Left Breast 42.56 Gy in 16 fractions.   . Hypertension   . RLS (restless legs syndrome)     Past Surgical History:  Procedure Laterality Date  . ABDOMINAL HYSTERECTOMY     1970's due to ectopic pregnancy  . BREAST BIOPSY  Left 2016  . BREAST LUMPECTOMY WITH RADIOACTIVE SEED AND SENTINEL LYMPH NODE BIOPSY Left 08/12/2018   Procedure: LEFT BREAST LUMPECTOMY WITH RADIOACTIVE SEED AND SENTINEL LYMPH NODE BIOPSY;  Surgeon: Erroll Luna, MD;  Location: [place];  Service: General;  Laterality: Left;  . BREAST SURGERY     cyst removal  . CARPOMETACARPEL SUSPENSION PLASTY Right 10/30/2016   Procedure: SUSPENSION PLASTY abductor pollicis longus transfer excision  trapezium right;  Surgeon: Daryll Brod, MD;  Location: [place];  Service: Orthopedics;  Laterality: Right;  axillary block  SUSPENSION PLASTY abductor pollicis longus transfer excision trapezium right  . KIDNEY DONATION Left   . PORTACATH PLACEMENT Right 02/24/2018   Procedure: INSERTION OF RIGHT INTERNAL JUGULAR PORT-A-CATH WITH ULTRA SOUND GUIDANCE ERAS PATHWAY;  Surgeon: Erroll Luna, MD;  Location: [place];  Service: General;  Laterality: Right;  . WRIST SURGERY     cyst removal    Family History  Problem Relation Age of Onset  . Liver cancer Mother        deceased 69  . Lung cancer Father 69       deceased 27; smoker  . Stomach cancer Maternal Grandmother        deceased 33  . Cancer Paternal Uncle        deceased 51; unk. type  . Breast cancer Maternal Aunt        dx 15s; deceased 53s  . Breast cancer Maternal Aunt        dx 23s; deceased 26s  . Breast cancer Cousin        daughter of mat aunt with breast ca    Social History   Socioeconomic History  . Marital status: Married    Spouse name: Not on file  . Number of children: Not on file  . Years of education: Not on file  . Highest education level: Not on file  Occupational History  . Not on file  Social Needs  . Financial resource strain: Not on file  . Food insecurity:    Worry: Not on file    Inability: Not on file  . Transportation needs:    Medical: No    Non-medical: No  Tobacco Use  . Smoking status: Current Every Day Smoker    Packs/day: 0.50    Years: 47.00    Pack years: 23.50    Types: Cigarettes  . Smokeless tobacco: Never Used  Substance and Sexual Activity  . Alcohol use: No  . Drug use: No  . Sexual activity: Not on file  Lifestyle  . Physical activity:    Days per week: Not on file    Minutes per session: Not on file  . Stress: Not on file  Relationships  . Social connections:    Talks on phone: Not on file    Gets together: Not on file    Attends religious  service: Not on file    Active member of club or organization: Not on file    Attends meetings of clubs or organizations: Not on file    Relationship status: Not on file  . Intimate partner violence:    Fear of current or ex partner: No    Emotionally abused: No    Physically abused: No    Forced sexual activity: No  Other Topics Concern  . Not on file  Social History Narrative  . Not on file    Past Medical History, Surgical history, Social history, and Family history  were reviewed and updated as appropriate.   Please see review of systems for further details on the patient's review from today.   Review of Systems:  Review of Systems  Constitutional: Positive for appetite change and fatigue. Negative for chills, diaphoresis and fever.  HENT: Negative for congestion, postnasal drip, rhinorrhea and sore throat.   Respiratory: Positive for cough. Negative for shortness of breath and wheezing.   Cardiovascular: Negative for palpitations.  Gastrointestinal: Negative for constipation, diarrhea, nausea and vomiting.  Neurological: Positive for dizziness and headaches.  Psychiatric/Behavioral: Positive for sleep disturbance.    Objective:   Physical Exam:  BP (!) 176/79 (BP Location: Left Arm, Patient Position: Sitting)   Pulse 65   Temp 98.4 F (36.9 C) (Oral)   Resp 17   Ht 5\' 3"  (1.6 m)   Wt 142 lb (64.4 kg)   SpO2 100%   BMI 25.15 kg/m  ECOG: 0  Physical Exam Constitutional:      General: She is not in acute distress.    Appearance: She is not diaphoretic.  HENT:     Head: Normocephalic and atraumatic.     Right Ear: Tympanic membrane, ear canal and external ear normal.     Left Ear: Tympanic membrane, ear canal and external ear normal.     Mouth/Throat:     Mouth: Mucous membranes are moist.     Pharynx: Oropharynx is clear. No oropharyngeal exudate.  Neck:     Musculoskeletal: Normal range of motion and neck supple.  Cardiovascular:     Rate and Rhythm:  Normal rate and regular rhythm.     Heart sounds: Normal heart sounds. No murmur. No friction rub. No gallop.   Pulmonary:     Effort: Pulmonary effort is normal. No respiratory distress.     Breath sounds: Normal breath sounds. No wheezing or rales.  Lymphadenopathy:     Cervical: No cervical adenopathy.  Skin:    General: Skin is warm and dry.     Findings: No erythema or rash.  Neurological:     Mental Status: She is alert.     Coordination: Coordination normal.  Psychiatric:        Behavior: Behavior normal.        Thought Content: Thought content normal.        Judgment: Judgment normal.     Lab Review:     Component Value Date/Time   NA 142 01/18/2019 1332   K 4.2 01/18/2019 1332   CL 108 01/18/2019 1332   CO2 26 01/18/2019 1332   GLUCOSE 98 01/18/2019 1332   BUN 11 01/18/2019 1332   CREATININE 0.98 01/18/2019 1332   CALCIUM 9.1 01/18/2019 1332   PROT 7.1 01/18/2019 1332   ALBUMIN 3.8 01/18/2019 1332   AST 14 (L) 01/18/2019 1332   ALT 7 01/18/2019 1332   ALKPHOS 90 01/18/2019 1332   BILITOT 0.3 01/18/2019 1332   GFRNONAA 58 (L) 01/18/2019 1332   GFRAA >60 01/18/2019 1332       Component Value Date/Time   WBC 12.5 (H) 01/18/2019 1332   WBC 11.8 (H) 02/22/2018 1208   RBC 3.83 (L) 01/18/2019 1332   HGB 11.1 (L) 01/18/2019 1332   HCT 33.8 (L) 01/18/2019 1332   PLT 373 01/18/2019 1332   MCV 88.3 01/18/2019 1332   MCH 29.0 01/18/2019 1332   MCHC 32.8 01/18/2019 1332   RDW 13.8 01/18/2019 1332   LYMPHSABS 2.9 01/18/2019 1332   MONOABS 1.0 01/18/2019 1332   EOSABS  0.2 01/18/2019 1332   BASOSABS 0.1 01/18/2019 1332   -------------------------------  Imaging from last 24 hours (if applicable):  Radiology interpretation: No results found.

## 2019-02-01 NOTE — Progress Notes (Signed)
Patient Care Team: [name], MD as PCP - General (Internal Medicine)  DIAGNOSIS:    ICD-10-CM   1. Malignant neoplasm of upper-inner quadrant of left breast in female, estrogen receptor positive ([place]) C50.212    Z17.0     SUMMARY OF ONCOLOGIC HISTORY:   Malignant neoplasm of upper-inner quadrant of left breast in female, estrogen receptor positive ([place])   02/03/2018 Initial Diagnosis    Left breast palpable lump at 1130 position 2.3 cm, at 11 o'clock position 6 mm which was benign; biopsy revealed IDC grade 3 ER 30%, PR 0%, Ki-67 30%, HER-2 positive ratio 5.62, copy #16.3, axillary lymph node biopsy benign concordant, T2 N0 stage II a clinical stage AJCC 8    02/18/2018 Breast MRI    Solitary mass in left breast upper outer quadrant 2.5 cm.  No additional enhancement in the right breast.  No adenopathy    02/26/2018 - 04/15/2018 Neo-Adjuvant Chemotherapy    [place] Perjeta X 3 cycles (stopped early due to toxicities) followed by Herceptin Perjeta maintenance    06/18/2018 Breast MRI    Marked reduction in the abnormal enhancement in the upper inner left breast at the site of known cancer.  There is approximately 2.5 cm of residual linear non mass enhancement at the site of known cancer.    08/12/2018 Surgery    Left lumpectomy: No residual cancer identified 0/4 lymph nodes negative, complete pathologic response    09/14/2018 - 10/04/2018 Radiation Therapy    Adjuvant radiation therapy   Left Breast / 42.56 Gy in 16 fractions    11/11/2018 -  Anti-estrogen oral therapy    Letrozole 2.67m daily     CHIEF COMPLIANT: Follow-up of letrozole therapy and Herceptin Perjeta maintenance  INTERVAL HISTORY: Michelle SAMPEDROis a 73y.o. with above-mentioned history of HER-2 positive left breast cancer currently on adjuvant Herceptin and Perjeta, which will finish on 02/24/19. She finished radiation on 10/04/18 and is currently on letrozole therapy. She presented to the symptom management clinic  on 01/18/19 for nausea, dehydration, fatigue and a URI that caused her to miss 1.5 weeks of work. She presents to the clinic alone today and notes she is doing better but still fatigued. Her labs from today show: Hg 10.6, WBC 9.5, platelets 339, ANC 6.3.   REVIEW OF SYSTEMS:   Constitutional: Denies fevers, chills or abnormal weight loss (+) fatigue  Eyes: Denies blurriness of vision Ears, nose, mouth, throat, and face: Denies mucositis or sore throat Respiratory: Denies cough, dyspnea or wheezes Cardiovascular: Denies palpitation, chest discomfort Gastrointestinal: Denies heartburn or change in bowel habits (+) nausea Skin: Denies abnormal skin rashes Lymphatics: Denies new lymphadenopathy or easy bruising Neurological: Denies numbness, tingling or new weaknesses Behavioral/Psych: Mood is stable, no new changes  Extremities: No lower extremity edema Breast: denies any pain or lumps or nodules in either breasts All other systems were reviewed with the patient and are negative.  I have reviewed the past medical history, past surgical history, social history and family history with the patient and they are unchanged from previous note.  ALLERGIES:  is allergic to codeine and demerol [meperidine].  MEDICATIONS:  Current Outpatient Medications  Medication Sig Dispense Refill  . amLODipine (NORVASC) 10 MG tablet Take 10 mg by mouth daily.     .Marland Kitchenazithromycin (ZITHROMAX) 250 MG tablet 2 tablets Day 1 then 1 tab days 2 to 5 6 each 0  . hydrochlorothiazide (HYDRODIURIL) 12.5 MG tablet Take 12.5 mg by mouth daily.     .Marland Kitchen  hydrOXYzine (ATARAX/VISTARIL) 50 MG tablet Take 50 mg by mouth 3 (three) times daily as needed for anxiety.     Marland Kitchen latanoprost (XALATAN) 0.005 % ophthalmic solution Place 1 drop into both eyes at bedtime.     Marland Kitchen letrozole (FEMARA) 2.5 MG tablet Take 1 tablet (2.5 mg total) by mouth daily. 90 tablet 3  . lidocaine-prilocaine (EMLA) cream Apply to affected area once 30 g 3  .  LORazepam (ATIVAN) 0.5 MG tablet Take 1 tablet (0.5 mg total) by mouth at bedtime as needed for anxiety. 30 tablet 3  . ondansetron (ZOFRAN) 8 MG tablet Take 1 tablet (8 mg total) by mouth 2 (two) times daily as needed for refractory nausea / vomiting. 30 tablet 3  . pramipexole (MIRAPEX) 0.25 MG tablet Take 0.25-1 mg by mouth at bedtime.     . prochlorperazine (COMPAZINE) 10 MG tablet Take 1 tablet (10 mg total) by mouth every 6 (six) hours as needed (Nausea or vomiting). 30 tablet 1   No current facility-administered medications for this visit.    Facility-Administered Medications Ordered in Other Visits  Medication Dose Route Frequency Provider Last Rate Last Dose  . heparin lock flush 100 unit/mL  500 Units Intracatheter Once Nicholas Lose, MD        PHYSICAL EXAMINATION: ECOG PERFORMANCE STATUS: 1 - Symptomatic but completely ambulatory  Vitals:   02/03/19 1031  BP: (!) 182/80  Pulse: (!) 53  Resp: 17  Temp: 97.6 F (36.4 C)  SpO2: 100%   Filed Weights   02/03/19 1031  Weight: 142 lb 3.2 oz (64.5 kg)    GENERAL: alert, no distress and comfortable SKIN: skin color, texture, turgor are normal, no rashes or significant lesions EYES: normal, Conjunctiva are pink and non-injected, sclera clear OROPHARYNX: no exudate, no erythema and lips, buccal mucosa, and tongue normal  NECK: supple, thyroid normal size, non-tender, without nodularity LYMPH: no palpable lymphadenopathy in the cervical, axillary or inguinal LUNGS: clear to auscultation and percussion with normal breathing effort HEART: regular rate & rhythm and no murmurs and no lower extremity edema ABDOMEN: abdomen soft, non-tender and normal bowel sounds MUSCULOSKELETAL: no cyanosis of digits and no clubbing  NEURO: alert & oriented x 3 with fluent speech, no focal motor/sensory deficits EXTREMITIES: No lower extremity edema  LABORATORY DATA:  I have reviewed the data as listed CMP Latest Ref Rng & Units 01/18/2019  12/23/2018 11/11/2018  Glucose 70 - 99 mg/dL 98 112(H) 108(H)  BUN 8 - 23 mg/dL 11 13 11   Creatinine 0.44 - 1.00 mg/dL 0.98 0.85 0.83  Sodium 135 - 145 mmol/L 142 143 144  Potassium 3.5 - 5.1 mmol/L 4.2 4.1 3.7  Chloride 98 - 111 mmol/L 108 110 111  CO2 22 - 32 mmol/L 26 24 23   Calcium 8.9 - 10.3 mg/dL 9.1 9.2 8.9  Total Protein 6.5 - 8.1 g/dL 7.1 6.8 6.8  Total Bilirubin 0.3 - 1.2 mg/dL 0.3 0.4 0.3  Alkaline Phos 38 - 126 U/L 90 76 75  AST 15 - 41 U/L 14(L) 20 16  ALT 0 - 44 U/L 7 12 9     Lab Results  Component Value Date   WBC 9.5 02/03/2019   HGB 10.6 (L) 02/03/2019   HCT 31.6 (L) 02/03/2019   MCV 86.3 02/03/2019   PLT 339 02/03/2019   NEUTROABS 6.3 02/03/2019    ASSESSMENT & PLAN:  Malignant neoplasm of upper-inner quadrant of left breast in female, estrogen receptor positive ([facility]) 02/03/2018:Left breast palpable lump at  1130 position 2.3 cm, at 11 o'clock position 6 mm which was benign; biopsy revealed IDC grade 3 ER 30%, PR 0%, Ki-67 30%, HER-2 positive ratio 5.62, copy #16.3, axillary lymph node biopsy benign concordant, T2 N0 stage II a clinical stage AJCC 8  Treatment plan: 1.Neoadjuvant chemotherapy with [place] Perjeta x 3cycles (discontinued early due to toxicities of severe diarrhea nausea and vomiting)02/26/2018 to 5/16/2019followed by Herceptin Perjeta maintenance for 1 year 2.followed by breast conserving surgery9/10/2018: Pathologic complete response 43fllowed by adjuvant radiation 09/14/2018-10/04/2018 4.Followed by adjuvant antiestrogen therapy with letrozole for 5-7 years started 11/11/2018 ------------------------------------------------------------------------------------------------------------------------ 08/12/2018: Left lumpectomy: Pathologic complete response, 0/4 lymph nodes negative Adjuvant radiation started 09/14/2018  Current treatment: Herceptin Perjeta maintenance every 3 weeks. With letrozole 2.5 mg daily started 11/11/2018 Slight nausea  for which she takes ondansetron.   Tolerating it extremely well.  She will completed the Herceptin Perjeta maintenance by March 2020.  Letrozole toxicities:  1.  Nausea: Patient was nauseated initially but once she started taking it at bedtime the nausea went away. Otherwise tolerating letrozole extremely well  Chemotherapy-induced anemia: Hemoglobin is still trending down.  We will continue to watch and monitor this.  Insomnia: I renewed her prescription for lorazepam. Monitoring closely for toxicities  Return to clinic in 3 weeks for her last cycle of Herceptin and Perjeta.  She will be set up for a 348-monthollow-up for survivorship care plan visit.    No orders of the defined types were placed in this encounter.  The patient has a good understanding of the overall plan. she agrees with it. she will call with any problems that may develop before the next visit here.  GuNicholas LoseMD 02/03/2019  I,Julious Okaorshimer am acting as scribe for Dr. ViNicholas Lose I have reviewed the above documentation for accuracy and completeness, and I agree with the above.

## 2019-02-02 MED FILL — ONDANSETRON HCL 8 MG TABLET: 8 | 15 days supply | Qty: 30 | Fill #1

## 2019-02-02 MED FILL — LORazepam 0.5 MG TABS: 0.5 | 30 days supply | Qty: 30 | Fill #1

## 2019-02-03 ENCOUNTER — Inpatient Hospital Stay: Payer: Medicare HMO

## 2019-02-03 ENCOUNTER — Inpatient Hospital Stay: Payer: Medicare HMO | Attending: Hematology and Oncology

## 2019-02-03 ENCOUNTER — Inpatient Hospital Stay (HOSPITAL_BASED_OUTPATIENT_CLINIC_OR_DEPARTMENT_OTHER): Payer: Medicare HMO | Admitting: Hematology and Oncology

## 2019-02-03 DIAGNOSIS — Z803 Family history of malignant neoplasm of breast: Secondary | ICD-10-CM | POA: Diagnosis not present

## 2019-02-03 DIAGNOSIS — Z79811 Long term (current) use of aromatase inhibitors: Secondary | ICD-10-CM | POA: Diagnosis not present

## 2019-02-03 DIAGNOSIS — Z923 Personal history of irradiation: Secondary | ICD-10-CM | POA: Diagnosis not present

## 2019-02-03 DIAGNOSIS — R5383 Other fatigue: Secondary | ICD-10-CM | POA: Insufficient documentation

## 2019-02-03 DIAGNOSIS — Z170 Estrogen receptor positive status [ER+]: Secondary | ICD-10-CM | POA: Insufficient documentation

## 2019-02-03 DIAGNOSIS — Z79899 Other long term (current) drug therapy: Secondary | ICD-10-CM | POA: Insufficient documentation

## 2019-02-03 DIAGNOSIS — R110 Nausea: Secondary | ICD-10-CM

## 2019-02-03 DIAGNOSIS — M129 Arthropathy, unspecified: Secondary | ICD-10-CM | POA: Diagnosis not present

## 2019-02-03 DIAGNOSIS — I10 Essential (primary) hypertension: Secondary | ICD-10-CM | POA: Diagnosis not present

## 2019-02-03 DIAGNOSIS — F17210 Nicotine dependence, cigarettes, uncomplicated: Secondary | ICD-10-CM | POA: Insufficient documentation

## 2019-02-03 DIAGNOSIS — C50212 Malignant neoplasm of upper-inner quadrant of left female breast: Secondary | ICD-10-CM

## 2019-02-03 DIAGNOSIS — Z800 Family history of malignant neoplasm of digestive organs: Secondary | ICD-10-CM | POA: Diagnosis not present

## 2019-02-03 DIAGNOSIS — G4700 Insomnia, unspecified: Secondary | ICD-10-CM | POA: Insufficient documentation

## 2019-02-03 DIAGNOSIS — Z5112 Encounter for antineoplastic immunotherapy: Secondary | ICD-10-CM | POA: Diagnosis not present

## 2019-02-03 DIAGNOSIS — L231 Allergic contact dermatitis due to adhesives: Secondary | ICD-10-CM | POA: Diagnosis not present

## 2019-02-03 DIAGNOSIS — Z801 Family history of malignant neoplasm of trachea, bronchus and lung: Secondary | ICD-10-CM | POA: Diagnosis not present

## 2019-02-03 DIAGNOSIS — F419 Anxiety disorder, unspecified: Secondary | ICD-10-CM | POA: Insufficient documentation

## 2019-02-03 DIAGNOSIS — Z95828 Presence of other vascular implants and grafts: Secondary | ICD-10-CM

## 2019-02-03 LAB — CBC WITH DIFFERENTIAL (CANCER CENTER ONLY)
Abs Immature Granulocytes: 0.02 10*3/uL (ref 0.00–0.07)
Basophils Absolute: 0 10*3/uL (ref 0.0–0.1)
Basophils Relative: 0 %
EOS PCT: 2 %
Eosinophils Absolute: 0.2 10*3/uL (ref 0.0–0.5)
HCT: 31.6 % — ABNORMAL LOW (ref 36.0–46.0)
Hemoglobin: 10.6 g/dL — ABNORMAL LOW (ref 12.0–15.0)
Immature Granulocytes: 0 %
Lymphocytes Relative: 24 %
Lymphs Abs: 2.2 10*3/uL (ref 0.7–4.0)
MCH: 29 pg (ref 26.0–34.0)
MCHC: 33.5 g/dL (ref 30.0–36.0)
MCV: 86.3 fL (ref 80.0–100.0)
Monocytes Absolute: 0.7 10*3/uL (ref 0.1–1.0)
Monocytes Relative: 8 %
Neutro Abs: 6.3 10*3/uL (ref 1.7–7.7)
Neutrophils Relative %: 66 %
Platelet Count: 339 10*3/uL (ref 150–400)
RBC: 3.66 MIL/uL — ABNORMAL LOW (ref 3.87–5.11)
RDW: 13.8 % (ref 11.5–15.5)
WBC Count: 9.5 10*3/uL (ref 4.0–10.5)
nRBC: 0 % (ref 0.0–0.2)

## 2019-02-03 LAB — COMPREHENSIVE METABOLIC PANEL
ALK PHOS: 70 U/L (ref 38–126)
ALT: 12 U/L (ref 0–44)
AST: 17 U/L (ref 15–41)
Albumin: 3.9 g/dL (ref 3.5–5.0)
Anion gap: 8 (ref 5–15)
BUN: 15 mg/dL (ref 8–23)
CO2: 21 mmol/L — ABNORMAL LOW (ref 22–32)
Calcium: 8.9 mg/dL (ref 8.9–10.3)
Chloride: 110 mmol/L (ref 98–111)
Creatinine, Ser: 0.8 mg/dL (ref 0.44–1.00)
GFR calc Af Amer: >60 mL/min (ref >60)
GFR calc non Af Amer: >60 mL/min (ref >60)
Glucose, Bld: 84 mg/dL (ref 70–99)
Potassium: 4 mmol/L (ref 3.5–5.1)
Sodium: 139 mmol/L (ref 135–145)
Total Bilirubin: 0.5 mg/dL (ref 0.3–1.2)
Total Protein: 6.9 g/dL (ref 6.5–8.1)

## 2019-02-03 MED ORDER — DIPHENHYDRAMINE HCL 25 MG PO CAPS
50.0000 mg | ORAL_CAPSULE | Freq: Once | ORAL | Status: AC
  Administered 2019-02-03: 50 mg via ORAL

## 2019-02-03 MED ORDER — TRASTUZUMAB CHEMO 150 MG IV SOLR
6.0000 mg/kg | Freq: Once | INTRAVENOUS | Status: AC
  Administered 2019-02-03: 399 mg via INTRAVENOUS
  Filled 2019-02-03: qty 19

## 2019-02-03 MED ORDER — HEPARIN SOD (PORK) LOCK FLUSH 100 UNIT/ML IV SOLN
500.0000 [IU] | Freq: Once | INTRAVENOUS | Status: DC
  Filled 2019-02-03: qty 5

## 2019-02-03 MED ORDER — SODIUM CHLORIDE 0.9% FLUSH
10.0000 mL | INTRAVENOUS | Status: DC | PRN
  Administered 2019-02-03: 10 mL
  Filled 2019-02-03: qty 10

## 2019-02-03 MED ORDER — HEPARIN SOD (PORK) LOCK FLUSH 100 UNIT/ML IV SOLN
500.0000 [IU] | Freq: Once | INTRAVENOUS | Status: AC | PRN
  Administered 2019-02-03: 500 [IU]
  Filled 2019-02-03: qty 5

## 2019-02-03 MED ORDER — SODIUM CHLORIDE 0.9 % IV SOLN
420.0000 mg | Freq: Once | INTRAVENOUS | Status: AC
  Administered 2019-02-03: 420 mg via INTRAVENOUS
  Filled 2019-02-03: qty 14

## 2019-02-03 MED ORDER — DIPHENHYDRAMINE HCL 25 MG PO CAPS
ORAL_CAPSULE | ORAL | Status: AC
  Filled 2019-02-03: qty 2

## 2019-02-03 MED ORDER — SODIUM CHLORIDE 0.9% FLUSH
10.0000 mL | Freq: Once | INTRAVENOUS | Status: AC
  Administered 2019-02-03: 10 mL
  Filled 2019-02-03: qty 10

## 2019-02-03 MED ORDER — ACETAMINOPHEN 325 MG PO TABS
ORAL_TABLET | ORAL | Status: AC
  Filled 2019-02-03: qty 2

## 2019-02-03 MED ORDER — DIPHENHYDRAMINE HCL 25 MG PO CAPS
ORAL_CAPSULE | ORAL | Status: AC
  Filled 2019-02-03: qty 1

## 2019-02-03 MED ORDER — SODIUM CHLORIDE 0.9 % IV SOLN
Freq: Once | INTRAVENOUS | Status: AC
  Administered 2019-02-03: 11:00:00 via INTRAVENOUS
  Filled 2019-02-03: qty 250

## 2019-02-03 MED ORDER — ACETAMINOPHEN 325 MG PO TABS
650.0000 mg | ORAL_TABLET | Freq: Once | ORAL | Status: AC
  Administered 2019-02-03: 650 mg via ORAL

## 2019-02-03 NOTE — Assessment & Plan Note (Addendum)
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, copy #16.3, axillary lymph node biopsy benign concordant, T2 N0 stage II a clinical stage AJCC 8  Treatment plan: 1.Neoadjuvant chemotherapy with [place] Perjeta x 3cycles (discontinued early due to toxicities of severe diarrhea nausea and vomiting)02/26/2018 to 5/16/2019followed by Herceptin Perjeta maintenance for 1 year 2.followed by breast conserving surgery9/10/2018: Pathologic complete response 37fllowed by adjuvant radiation 09/14/2018-10/04/2018 4.Followed by adjuvant antiestrogen therapy with letrozole for 5-7 years started 11/11/2018 ------------------------------------------------------------------------------------------------------------------------ 08/12/2018: Left lumpectomy: Pathologic complete response, 0/4 lymph nodes negative Adjuvant radiation started 09/14/2018  Current treatment: Herceptin Perjeta maintenance every 3 weeks. With letrozole 2.5 mg daily started 11/11/2018 Slight nausea for which she takes ondansetron.   Tolerating it extremely well.  She will completed the Herceptin Perjeta maintenance by March 2020.  Letrozole toxicities:  1.  Nausea: Patient was nauseated initially but once she started taking it at bedtime the nausea went away. Otherwise tolerating letrozole extremely well  Chemotherapy-induced anemia: Hemoglobin is still trending down.  We will continue to watch and monitor this.  Insomnia: I renewed her prescription for lorazepam. Monitoring closely for toxicities  Return to clinic in 3 weeks for her last cycle of Herceptin and Perjeta.  She will be set up for a 327-monthollow-up for survivorship care plan visit.

## 2019-02-03 NOTE — Patient Instructions (Signed)

## 2019-02-03 NOTE — Patient Instructions (Signed)
[facility] Discharge Instructions for Patients Receiving Chemotherapy  Today you received the following chemotherapy agents Herceptin and Perjeta.   To help prevent nausea and vomiting after your treatment, we encourage you to take your nausea medication as directed.   If you develop nausea and vomiting that is not controlled by your nausea medication, call the clinic.   BELOW ARE SYMPTOMS THAT SHOULD BE REPORTED IMMEDIATELY:  *FEVER GREATER THAN 100.5 F  *CHILLS WITH OR WITHOUT FEVER  NAUSEA AND VOMITING THAT IS NOT CONTROLLED WITH YOUR NAUSEA MEDICATION  *UNUSUAL SHORTNESS OF BREATH  *UNUSUAL BRUISING OR BLEEDING  TENDERNESS IN MOUTH AND THROAT WITH OR WITHOUT PRESENCE OF ULCERS  *URINARY PROBLEMS  *BOWEL PROBLEMS  UNUSUAL RASH Items with * indicate a potential emergency and should be followed up as soon as possible.  Feel free to call the clinic should you have any questions or concerns. The clinic phone number is [phone].  Please show the CHEMO ALERT CARD at check-in to the Emergency Department and triage nurse.   

## 2019-02-07 ENCOUNTER — Inpatient Hospital Stay (HOSPITAL_BASED_OUTPATIENT_CLINIC_OR_DEPARTMENT_OTHER): Payer: Medicare HMO | Admitting: Medical

## 2019-02-07 ENCOUNTER — Telehealth: Payer: Self-pay

## 2019-02-07 ENCOUNTER — Telehealth: Payer: Self-pay | Admitting: *Deleted

## 2019-02-07 ENCOUNTER — Inpatient Hospital Stay: Payer: Medicare HMO

## 2019-02-07 VITALS — BP 186/77 | HR 62 | Temp 98.1°F | Resp 18 | Ht 63.0 in | Wt 144.8 lb

## 2019-02-07 DIAGNOSIS — M129 Arthropathy, unspecified: Secondary | ICD-10-CM

## 2019-02-07 DIAGNOSIS — F17210 Nicotine dependence, cigarettes, uncomplicated: Secondary | ICD-10-CM

## 2019-02-07 DIAGNOSIS — Z79811 Long term (current) use of aromatase inhibitors: Secondary | ICD-10-CM

## 2019-02-07 DIAGNOSIS — Z170 Estrogen receptor positive status [ER+]: Secondary | ICD-10-CM | POA: Diagnosis not present

## 2019-02-07 DIAGNOSIS — Z800 Family history of malignant neoplasm of digestive organs: Secondary | ICD-10-CM

## 2019-02-07 DIAGNOSIS — Z801 Family history of malignant neoplasm of trachea, bronchus and lung: Secondary | ICD-10-CM

## 2019-02-07 DIAGNOSIS — C50212 Malignant neoplasm of upper-inner quadrant of left female breast: Secondary | ICD-10-CM

## 2019-02-07 DIAGNOSIS — R5383 Other fatigue: Secondary | ICD-10-CM

## 2019-02-07 DIAGNOSIS — F419 Anxiety disorder, unspecified: Secondary | ICD-10-CM

## 2019-02-07 DIAGNOSIS — L231 Allergic contact dermatitis due to adhesives: Secondary | ICD-10-CM

## 2019-02-07 DIAGNOSIS — Z923 Personal history of irradiation: Secondary | ICD-10-CM

## 2019-02-07 DIAGNOSIS — Z803 Family history of malignant neoplasm of breast: Secondary | ICD-10-CM

## 2019-02-07 DIAGNOSIS — R110 Nausea: Secondary | ICD-10-CM | POA: Diagnosis not present

## 2019-02-07 DIAGNOSIS — G4700 Insomnia, unspecified: Secondary | ICD-10-CM

## 2019-02-07 DIAGNOSIS — Z5112 Encounter for antineoplastic immunotherapy: Secondary | ICD-10-CM | POA: Diagnosis not present

## 2019-02-07 DIAGNOSIS — I10 Essential (primary) hypertension: Secondary | ICD-10-CM

## 2019-02-07 DIAGNOSIS — Z79899 Other long term (current) drug therapy: Secondary | ICD-10-CM

## 2019-02-07 LAB — CBC WITH DIFFERENTIAL (CANCER CENTER ONLY)
Abs Immature Granulocytes: 0.02 10*3/uL (ref 0.00–0.07)
Basophils Absolute: 0.1 10*3/uL (ref 0.0–0.1)
Basophils Relative: 1 %
Eosinophils Absolute: 0.1 10*3/uL (ref 0.0–0.5)
Eosinophils Relative: 1 %
HCT: 31.7 % — ABNORMAL LOW (ref 36.0–46.0)
Hemoglobin: 10.2 g/dL — ABNORMAL LOW (ref 12.0–15.0)
IMMATURE GRANULOCYTES: 0 %
Lymphocytes Relative: 27 %
Lymphs Abs: 2.4 10*3/uL (ref 0.7–4.0)
MCH: 28.3 pg (ref 26.0–34.0)
MCHC: 32.2 g/dL (ref 30.0–36.0)
MCV: 88.1 fL (ref 80.0–100.0)
Monocytes Absolute: 0.8 10*3/uL (ref 0.1–1.0)
Monocytes Relative: 9 %
NEUTROS PCT: 62 %
Neutro Abs: 5.5 10*3/uL (ref 1.7–7.7)
Platelet Count: 317 10*3/uL (ref 150–400)
RBC: 3.6 MIL/uL — ABNORMAL LOW (ref 3.87–5.11)
RDW: 14.2 % (ref 11.5–15.5)
WBC Count: 8.9 10*3/uL (ref 4.0–10.5)
nRBC: 0 % (ref 0.0–0.2)

## 2019-02-07 MED ORDER — TRIAMCINOLONE ACETONIDE 0.1 % EX CREA: 1.0000 "application " | TOPICAL_CREAM | Freq: Three times a day (TID) | CUTANEOUS | 1 refills | Status: DC

## 2019-02-07 MED FILL — TRIAMCINOLONE 0.1% CREAM: 0.1 | 20 days supply | Qty: 45 | Fill #0

## 2019-02-07 NOTE — Progress Notes (Signed)
Pt presents with red itchy splotchy area around port.  Not accessed.  Afebrile.  Denies CP or SOB or N/V/D.  A&Ox4.  Denies recent injury.  Reports that is started Saturday w/tenderness & minor heat and swelling.

## 2019-02-07 NOTE — Telephone Encounter (Signed)
Left voicemail for patient to return call regarding concerns with port.

## 2019-02-07 NOTE — Telephone Encounter (Signed)
Received VM message from patient regarding her port-redness and bruising, TCT patient and spoke with her.  Pt states she has had her port for about 1 year and has never had a problem with it. She noticed over the weekend and today, that the area around the port is red and bruised in appearance and has a ' reddish white pimple' on the port site. No drainage at this time. Denies fever/chills. She states it is very itchy and painful. It has not been this way before. Her last port access/treatment was on Thursday, 02/03/19. It did not look this way on that date.  Please advise.

## 2019-02-07 NOTE — Patient Instructions (Signed)

## 2019-02-07 NOTE — Telephone Encounter (Signed)
Spoke with patient regarding previous TC.  Patient reports redness to port site since 02/05/2019.  No fever.  "White pimple like area" where she was accessed on 02/03/2019.  Reports itching to site, and pain across port area to shoulder.  Per recommendations pt will be evaluated by [facility] clinic - pt aware, voiced agreement and understanding.

## 2019-02-09 NOTE — Progress Notes (Signed)
Symptoms Management Clinic Progress Note   [name] [id number] 03-14-1946 73 y.o.  [name] is managed by Dr. Nicholas Lose  Actively treated with chemotherapy/immunotherapy/hormonal therapy: yes  Current therapy: Herceptin and Perjeta  Last treated: 02/03/2019 (cycle 17)  Next scheduled appointment with provider: 05/27/2019  Assessment: Plan:    Contact dermatitis due to adhesives, unspecified contact dermatitis type - Plan: triamcinolone cream (KENALOG) 0.1 %  Malignant neoplasm of upper-inner quadrant of left breast in female, estrogen receptor positive ([place])   Contact dermatitis of the right chest wall: [name] was given a prescription for triamcinolone cream to use 3 times daily as needed.  ER positive malignant neoplasm of the left breast: [name] continues to be followed by Dr. Nicholas Lose and is status post cycle 17 of Herceptin and Perjeta which is dosed on 02/03/2019.  Please see After Visit Summary for patient specific instructions.  Future Appointments  Date Time Provider [place]  02/11/2019 10:00 AM [facility] ECHO 1-BUZZ [facility]  02/11/2019 11:00 AM Larey Dresser, MD [facility] None  02/24/2019  9:30 AM [facility] INFUSION [facility] None  05/27/2019 10:00 AM Causey, Charlestine Massed, NP [facility] None    No orders of the defined types were placed in this encounter.      Subjective:   Patient ID:  [name] is a 73 y.o. (DOB 1946-10-01) female.  Chief Complaint:  Chief Complaint  Patient presents with  . Rash    HPI [name] is a 73 year old female with a diagnosis of an ER positive malignant neoplasm of the left breast who is managed by Dr. Nicholas Lose and is status post cycle 17 of Herceptin and Perjeta which was dosed on 02/03/2019.  She presents to the office today with a rash of the right chest wall lateral and medial to her chest wall Port-A-Cath.  The area is erythematous, painful, and is itching.  She  denies any changes in personal care products and is never had a reaction to adhesives that have been used to apply dressings over her port.  Medications: I have reviewed the patient's current medications.  Allergies:  Allergies  Allergen Reactions  . Codeine Nausea And Vomiting  . Demerol [Meperidine] Nausea And Vomiting    Past Medical History:  Diagnosis Date  . Anxiety   . Arthritis   . Breast cancer ([place])   . Cough   . Family history of breast cancer   . Genetic testing 02/19/2018   Multi-Cancer panel (83 genes) @ [facility] - No pathogenic mutations detected  . GERD (gastroesophageal reflux disease)   . History of radiation therapy 09/13/18- 10/04/18   Left Breast 42.56 Gy in 16 fractions.   . Hypertension   . RLS (restless legs syndrome)     Past Surgical History:  Procedure Laterality Date  . ABDOMINAL HYSTERECTOMY     1970's due to ectopic pregnancy  . BREAST BIOPSY Left 2016  . BREAST LUMPECTOMY WITH RADIOACTIVE SEED AND SENTINEL LYMPH NODE BIOPSY Left 08/12/2018   Procedure: LEFT BREAST LUMPECTOMY WITH RADIOACTIVE SEED AND SENTINEL LYMPH NODE BIOPSY;  Surgeon: Erroll Luna, MD;  Location: [facility];  Service: General;  Laterality: Left;  . BREAST SURGERY     cyst removal  . CARPOMETACARPEL SUSPENSION PLASTY Right 10/30/2016   Procedure: SUSPENSION PLASTY abductor pollicis longus transfer excision trapezium right;  Surgeon: Daryll Brod, MD;  Location: [facility];  Service: Orthopedics;  Laterality: Right;  axillary block  SUSPENSION PLASTY abductor pollicis longus transfer excision trapezium right  . KIDNEY DONATION Left   . PORTACATH PLACEMENT Right 02/24/2018   Procedure: INSERTION OF RIGHT INTERNAL JUGULAR PORT-A-CATH WITH ULTRA SOUND GUIDANCE ERAS PATHWAY;  Surgeon: Erroll Luna, MD;  Location: [facility];  Service: General;  Laterality: Right;  . WRIST SURGERY     cyst removal    Family History  Problem Relation Age of Onset  .  Liver cancer Mother        deceased 37  . Lung cancer Father 54       deceased 71; smoker  . Stomach cancer Maternal Grandmother        deceased 86  . Cancer Paternal Uncle        deceased 90; unk. type  . Breast cancer Maternal Aunt        dx 70s; deceased 77s  . Breast cancer Maternal Aunt        dx 43s; deceased 75s  . Breast cancer Cousin        daughter of mat aunt with breast ca    Social History   Socioeconomic History  . Marital status: Married    Spouse name: Not on file  . Number of children: Not on file  . Years of education: Not on file  . Highest education level: Not on file  Occupational History  . Not on file  Social Needs  . Financial resource strain: Not on file  . Food insecurity:    Worry: Not on file    Inability: Not on file  . Transportation needs:    Medical: No    Non-medical: No  Tobacco Use  . Smoking status: Current Every Day Smoker    Packs/day: 0.50    Years: 47.00    Pack years: 23.50    Types: Cigarettes  . Smokeless tobacco: Never Used  Substance and Sexual Activity  . Alcohol use: No  . Drug use: No  . Sexual activity: Not on file  Lifestyle  . Physical activity:    Days per week: Not on file    Minutes per session: Not on file  . Stress: Not on file  Relationships  . Social connections:    Talks on phone: Not on file    Gets together: Not on file    Attends religious service: Not on file    Active member of club or organization: Not on file    Attends meetings of clubs or organizations: Not on file    Relationship status: Not on file  . Intimate partner violence:    Fear of current or ex partner: No    Emotionally abused: No    Physically abused: No    Forced sexual activity: No  Other Topics Concern  . Not on file  Social History Narrative  . Not on file    Past Medical History, Surgical history, Social history, and Family history were reviewed and updated as appropriate.   Please see review of systems for  further details on the patient's review from today.   Review of Systems:  Review of Systems  Constitutional: Negative for chills, diaphoresis and fever.  HENT: Negative for facial swelling and trouble swallowing.   Respiratory: Negative for cough, chest tightness and shortness of breath.   Cardiovascular: Negative for chest pain.  Skin: Positive for rash.    Objective:   Physical Exam:  BP (!) 186/77 (BP Location: Right Arm, Patient Position: Sitting) Comment: Liza RN was notified  Pulse 62   Temp 98.1 F (36.7 C) (Oral)   Resp 18   Ht 5\' 3"  (1.6 m)   Wt 144 lb 12.8 oz (65.7 kg)   SpO2 100%   BMI 25.65 kg/m  ECOG: 0  Physical Exam Constitutional:      General: She is not in acute distress.    Appearance: She is not diaphoretic.  HENT:     Head: Normocephalic and atraumatic.  Eyes:     General: No scleral icterus.       Right eye: No discharge.        Left eye: No discharge.     Conjunctiva/sclera: Conjunctivae normal.  Cardiovascular:     Rate and Rhythm: Normal rate and regular rhythm.     Heart sounds: Normal heart sounds. No murmur. No friction rub. No gallop.   Pulmonary:     Effort: Pulmonary effort is normal. No respiratory distress.     Breath sounds: Normal breath sounds. No wheezing or rales.  Skin:    General: Skin is warm and dry.     Findings: Erythema and rash present.     Comments: Irregular slightly raised erythematous lesions lateral and medial to a right chest wall Port-A-Cath.  The medial lesion is larger than the lateral lesion.  No exudate or increased warmth is noted.  Neurological:     Mental Status: She is alert.     Gait: Gait normal.  Psychiatric:        Mood and Affect: Mood normal.        Behavior: Behavior normal.        Thought Content: Thought content normal.        Judgment: Judgment normal.     Lab Review:     Component Value Date/Time   NA 139 02/03/2019 1006   K 4.0 02/03/2019 1006   CL 110 02/03/2019 1006   CO2 21 (L)  02/03/2019 1006   GLUCOSE 84 02/03/2019 1006   BUN 15 02/03/2019 1006   CREATININE 0.80 02/03/2019 1006   CREATININE 0.98 01/18/2019 1332   CALCIUM 8.9 02/03/2019 1006   PROT 6.9 02/03/2019 1006   ALBUMIN 3.9 02/03/2019 1006   AST 17 02/03/2019 1006   AST 14 (L) 01/18/2019 1332   ALT 12 02/03/2019 1006   ALT 7 01/18/2019 1332   ALKPHOS 70 02/03/2019 1006   BILITOT 0.5 02/03/2019 1006   BILITOT 0.3 01/18/2019 1332   GFRNONAA >60 02/03/2019 1006   GFRNONAA 58 (L) 01/18/2019 1332   GFRAA >60 02/03/2019 1006   GFRAA >60 01/18/2019 1332       Component Value Date/Time   WBC 8.9 02/07/2019 1503   WBC 11.8 (H) 02/22/2018 1208   RBC 3.60 (L) 02/07/2019 1503   HGB 10.2 (L) 02/07/2019 1503   HCT 31.7 (L) 02/07/2019 1503   PLT 317 02/07/2019 1503   MCV 88.1 02/07/2019 1503   MCH 28.3 02/07/2019 1503   MCHC 32.2 02/07/2019 1503   RDW 14.2 02/07/2019 1503   LYMPHSABS 2.4 02/07/2019 1503   MONOABS 0.8 02/07/2019 1503   EOSABS 0.1 02/07/2019 1503   BASOSABS 0.1 02/07/2019 1503   -------------------------------  Imaging from last 24 hours (if applicable):  Radiology interpretation: No results found.

## 2019-02-11 ENCOUNTER — Ambulatory Visit (HOSPITAL_COMMUNITY)
Admission: RE | Admit: 2019-02-11 | Discharge: 2019-02-11 | Disposition: A | Discharge status: Home / Self Care | Payer: Medicare HMO | Source: Ambulatory Visit | Attending: Cardiology | Admitting: Cardiology

## 2019-02-11 ENCOUNTER — Ambulatory Visit (HOSPITAL_BASED_OUTPATIENT_CLINIC_OR_DEPARTMENT_OTHER)
Admission: RE | Admit: 2019-02-11 | Discharge: 2019-02-11 | Disposition: A | Discharge status: Home / Self Care | Payer: Medicare HMO | Source: Ambulatory Visit | Attending: Cardiology | Admitting: Cardiology

## 2019-02-11 ENCOUNTER — Other Ambulatory Visit: Payer: Self-pay

## 2019-02-11 VITALS — BP 142/90 | HR 60 | Wt 145.4 lb

## 2019-02-11 DIAGNOSIS — Z170 Estrogen receptor positive status [ER+]: Secondary | ICD-10-CM | POA: Diagnosis not present

## 2019-02-11 DIAGNOSIS — Z803 Family history of malignant neoplasm of breast: Secondary | ICD-10-CM | POA: Diagnosis not present

## 2019-02-11 DIAGNOSIS — C50212 Malignant neoplasm of upper-inner quadrant of left female breast: Secondary | ICD-10-CM | POA: Insufficient documentation

## 2019-02-11 DIAGNOSIS — F17210 Nicotine dependence, cigarettes, uncomplicated: Secondary | ICD-10-CM | POA: Diagnosis not present

## 2019-02-11 DIAGNOSIS — I10 Essential (primary) hypertension: Secondary | ICD-10-CM | POA: Insufficient documentation

## 2019-02-11 DIAGNOSIS — Z801 Family history of malignant neoplasm of trachea, bronchus and lung: Secondary | ICD-10-CM | POA: Diagnosis not present

## 2019-02-11 DIAGNOSIS — Z79899 Other long term (current) drug therapy: Secondary | ICD-10-CM | POA: Diagnosis not present

## 2019-02-11 DIAGNOSIS — Z79811 Long term (current) use of aromatase inhibitors: Secondary | ICD-10-CM | POA: Insufficient documentation

## 2019-02-11 DIAGNOSIS — Z800 Family history of malignant neoplasm of digestive organs: Secondary | ICD-10-CM | POA: Diagnosis not present

## 2019-02-11 DIAGNOSIS — I358 Other nonrheumatic aortic valve disorders: Secondary | ICD-10-CM | POA: Diagnosis not present

## 2019-02-11 MED ORDER — HYDROCHLOROTHIAZIDE 25 MG PO TABS: 25.0000 mg | ORAL_TABLET | Freq: Every day | ORAL | 3 refills | Status: DC

## 2019-02-11 MED FILL — HYDROCHLOROTHIAZIDE 25 MG T: 25 | 90 days supply | Qty: 90 | Fill #0

## 2019-02-11 NOTE — Progress Notes (Signed)
  Echocardiogram 2D Echocardiogram has been performed.  [name] 02/11/2019, 10:58 AM

## 2019-02-11 NOTE — Patient Instructions (Signed)
INCREASE Hydrochlorothiazide to 25mg  (1 tab) daily, this has been sent to your pharmacy.   Follow up with this clinic as needed :)

## 2019-02-13 NOTE — Progress Notes (Signed)
Oncology: Dr. Lindi Adie  73 yo with history of HTN and smoking was referred by Dr. Lindi Adie for cardio-oncology evaluation.  Left breast cancer diagnosed in 3/19.  ER+/PR-/HER2+.  She had 3 cycles of TCH-Perjeta but could not tolerate it due to side effects.  She is now on Herceptin-Perjeta to continue for a year.  She had lumpectomy in 9/19 and has finished radiation.   She is doing well at this point, no chest pain.  No exertional dyspnea.  She is still smoking about 1/2 ppd.   PMH: 1. HTN 2. Active smoker 3. Breast cancer: Left breast cancer diagnosed in 3/19.  ER+/PR-/HER2+.  She had 3 cycles of TCH-Perjeta but could not tolerate it due to side effects.  She is now on Herceptin-Perjeta to continue for a year.  Lumpectomy 9/19. Completed radiation.  - Echo (3/19): EF 60-65%, mild LVH. - Echo (6/19): EF 55-60%, GLS -19%, normal RV size and systolic function.  - Echo (9/19): EF 55-60%, GLS -21.4%, normal RV size and systolic function.  - Echo (3/20): EF 55-60%, GLS -15.1%, normal RV, biatrial enlargement.   Social History   Socioeconomic History  . Marital status: Married    Spouse name: Not on file  . Number of children: Not on file  . Years of education: Not on file  . Highest education level: Not on file  Occupational History  . Not on file  Social Needs  . Financial resource strain: Not on file  . Food insecurity:    Worry: Not on file    Inability: Not on file  . Transportation needs:    Medical: No    Non-medical: No  Tobacco Use  . Smoking status: Current Every Day Smoker    Packs/day: 0.50    Years: 47.00    Pack years: 23.50    Types: Cigarettes  . Smokeless tobacco: Never Used  Substance and Sexual Activity  . Alcohol use: No  . Drug use: No  . Sexual activity: Not on file  Lifestyle  . Physical activity:    Days per week: Not on file    Minutes per session: Not on file  . Stress: Not on file  Relationships  . Social connections:    Talks on phone: Not on file     Gets together: Not on file    Attends religious service: Not on file    Active member of club or organization: Not on file    Attends meetings of clubs or organizations: Not on file    Relationship status: Not on file  . Intimate partner violence:    Fear of current or ex partner: No    Emotionally abused: No    Physically abused: No    Forced sexual activity: No  Other Topics Concern  . Not on file  Social History Narrative  . Not on file   Family History  Problem Relation Age of Onset  . Liver cancer Mother        deceased 69  . Lung cancer Father 61       deceased 11; smoker  . Stomach cancer Maternal Grandmother        deceased 80  . Cancer Paternal Uncle        deceased 1; unk. type  . Breast cancer Maternal Aunt        dx 55s; deceased 51s  . Breast cancer Maternal Aunt        dx 41s; deceased 37s  . Breast cancer Cousin  daughter of mat aunt with breast ca   ROS: All systems reviewed and negative except as per HPI.   Current Outpatient Medications  Medication Sig Dispense Refill  . amLODipine (NORVASC) 10 MG tablet Take 10 mg by mouth daily.     . hydrochlorothiazide (HYDRODIURIL) 25 MG tablet Take 1 tablet (25 mg total) by mouth daily. 90 tablet 3  . hydrOXYzine (ATARAX/VISTARIL) 50 MG tablet Take 50 mg by mouth 3 (three) times daily as needed for anxiety.     Marland Kitchen latanoprost (XALATAN) 0.005 % ophthalmic solution Place 1 drop into both eyes at bedtime.     Marland Kitchen letrozole (FEMARA) 2.5 MG tablet Take 1 tablet (2.5 mg total) by mouth daily. 90 tablet 3  . lidocaine-prilocaine (EMLA) cream Apply to affected area once 30 g 3  . LORazepam (ATIVAN) 0.5 MG tablet Take 1 tablet (0.5 mg total) by mouth at bedtime as needed for anxiety. 30 tablet 3  . ondansetron (ZOFRAN) 8 MG tablet Take 1 tablet (8 mg total) by mouth 2 (two) times daily as needed for refractory nausea / vomiting. 30 tablet 3  . pramipexole (MIRAPEX) 0.25 MG tablet Take 0.25-1 mg by mouth at bedtime.      . prochlorperazine (COMPAZINE) 10 MG tablet Take 1 tablet (10 mg total) by mouth every 6 (six) hours as needed (Nausea or vomiting). 30 tablet 1  . triamcinolone cream (KENALOG) 0.1 % Apply 1 application topically 3 (three) times daily. 45 g 1   No current facility-administered medications for this encounter.    General: NAD Neck: No JVD, no thyromegaly or thyroid nodule.  Lungs: Clear to auscultation bilaterally with normal respiratory effort. CV: Nondisplaced PMI.  Heart regular S1/S2, no S3/S4, no murmur.  No peripheral edema.  No carotid bruit.  Normal pedal pulses.  Abdomen: Soft, nontender, no hepatosplenomegaly, no distention.  Skin: Intact without lesions or rashes.  Neurologic: Alert and oriented x 3.  Psych: Normal affect. Extremities: No clubbing or cyanosis.  HEENT: Normal.   Assessment/Plan:  1. Smoking: I strongly encouraged her to quit. She is trying.   2. HTN: BP elevated, increase HCTZ to 25 mg daily.  She will have BMET in 2 wks at the cancer center.   3. Breast cancer: She has 1 more dose of Herceptin left.  Echo today was reviewed. It showed normal EF but strain was less negative.  As she only has 1 more dose of Herceptin left, I will not make any changes.   She can followup prn.   Loralie Champagne 02/13/2019

## 2019-02-17 ENCOUNTER — Telehealth: Payer: Self-pay

## 2019-02-17 NOTE — Telephone Encounter (Signed)
Pt called with concern of possible exposure to covid-19. 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 Pt is very concern with her age, cancer, and hypertension hx. She also comes in next week for herceptin infusion. Told pt to monitor for any s/s of illness, especially cough, sob, fever that are persistent. If for any reason pt develops symptoms and thinks she has covid, pt able to call covid-19 hotline [phone] and notify them to see if she will need to be tested at one of the drive thru site in [place]. Pt verbalized understanding. Suggested that pt call a day ahead prior to coming in for her appt to make sure that she is not having any symptoms.

## 2019-02-23 ENCOUNTER — Other Ambulatory Visit: Payer: Self-pay | Admitting: *Deleted

## 2019-02-23 DIAGNOSIS — Z170 Estrogen receptor positive status [ER+]: Principal | ICD-10-CM

## 2019-02-23 DIAGNOSIS — C50212 Malignant neoplasm of upper-inner quadrant of left female breast: Secondary | ICD-10-CM

## 2019-02-24 ENCOUNTER — Inpatient Hospital Stay: Payer: Medicare HMO

## 2019-02-24 ENCOUNTER — Other Ambulatory Visit: Payer: Self-pay

## 2019-02-24 ENCOUNTER — Encounter: Payer: Self-pay | Admitting: *Deleted

## 2019-02-24 VITALS — BP 128/84 | HR 77 | Temp 98.0°F | Resp 17

## 2019-02-24 DIAGNOSIS — C50212 Malignant neoplasm of upper-inner quadrant of left female breast: Secondary | ICD-10-CM

## 2019-02-24 DIAGNOSIS — Z170 Estrogen receptor positive status [ER+]: Principal | ICD-10-CM

## 2019-02-24 DIAGNOSIS — Z5112 Encounter for antineoplastic immunotherapy: Secondary | ICD-10-CM | POA: Diagnosis not present

## 2019-02-24 DIAGNOSIS — Z95828 Presence of other vascular implants and grafts: Secondary | ICD-10-CM

## 2019-02-24 LAB — CBC WITH DIFFERENTIAL (CANCER CENTER ONLY)
Abs Immature Granulocytes: 0.03 10*3/uL (ref 0.00–0.07)
Basophils Absolute: 0.1 10*3/uL (ref 0.0–0.1)
Basophils Relative: 1 %
Eosinophils Absolute: 0.1 10*3/uL (ref 0.0–0.5)
Eosinophils Relative: 1 %
HCT: 38.7 % (ref 36.0–46.0)
Hemoglobin: 12.7 g/dL (ref 12.0–15.0)
Immature Granulocytes: 0 %
Lymphocytes Relative: 22 %
Lymphs Abs: 2.3 10*3/uL (ref 0.7–4.0)
MCH: 28.4 pg (ref 26.0–34.0)
MCHC: 32.8 g/dL (ref 30.0–36.0)
MCV: 86.6 fL (ref 80.0–100.0)
MONOS PCT: 7 %
Monocytes Absolute: 0.7 10*3/uL (ref 0.1–1.0)
NEUTROS PCT: 69 %
Neutro Abs: 7.5 10*3/uL (ref 1.7–7.7)
Platelet Count: 419 10*3/uL — ABNORMAL HIGH (ref 150–400)
RBC: 4.47 MIL/uL (ref 3.87–5.11)
RDW: 13.7 % (ref 11.5–15.5)
WBC Count: 10.6 10*3/uL — ABNORMAL HIGH (ref 4.0–10.5)
nRBC: 0 % (ref 0.0–0.2)

## 2019-02-24 LAB — BASIC METABOLIC PANEL - CANCER CENTER ONLY
Anion gap: 10 (ref 5–15)
BUN: 23 mg/dL (ref 8–23)
CO2: 22 mmol/L (ref 22–32)
Calcium: 9.3 mg/dL (ref 8.9–10.3)
Chloride: 105 mmol/L (ref 98–111)
Creatinine: 1.13 mg/dL — ABNORMAL HIGH (ref 0.44–1.00)
GFR, Est AFR Am: 57 mL/min — ABNORMAL LOW (ref >60)
GFR, Estimated: 49 mL/min — ABNORMAL LOW (ref >60)
GLUCOSE: 106 mg/dL — AB (ref 70–99)
Potassium: 4.1 mmol/L (ref 3.5–5.1)
Sodium: 137 mmol/L (ref 135–145)

## 2019-02-24 MED ORDER — ACETAMINOPHEN 325 MG PO TABS
ORAL_TABLET | ORAL | Status: AC
  Filled 2019-02-24: qty 2

## 2019-02-24 MED ORDER — SODIUM CHLORIDE 0.9 % IV SOLN
420.0000 mg | Freq: Once | INTRAVENOUS | Status: AC
  Administered 2019-02-24: 420 mg via INTRAVENOUS
  Filled 2019-02-24: qty 14

## 2019-02-24 MED ORDER — SODIUM CHLORIDE 0.9 % IV SOLN
Freq: Once | INTRAVENOUS | Status: AC
  Administered 2019-02-24: 09:00:00 via INTRAVENOUS
  Filled 2019-02-24: qty 250

## 2019-02-24 MED ORDER — DIPHENHYDRAMINE HCL 25 MG PO CAPS
ORAL_CAPSULE | ORAL | Status: AC
  Filled 2019-02-24: qty 1

## 2019-02-24 MED ORDER — SODIUM CHLORIDE 0.9% FLUSH
10.0000 mL | INTRAVENOUS | Status: DC | PRN
  Administered 2019-02-24: 10 mL
  Filled 2019-02-24: qty 10

## 2019-02-24 MED ORDER — DIPHENHYDRAMINE HCL 25 MG PO CAPS
50.0000 mg | ORAL_CAPSULE | Freq: Once | ORAL | Status: AC
  Administered 2019-02-24: 50 mg via ORAL

## 2019-02-24 MED ORDER — SODIUM CHLORIDE 0.9% FLUSH
10.0000 mL | Freq: Once | INTRAVENOUS | Status: AC
  Administered 2019-02-24: 10 mL
  Filled 2019-02-24: qty 10

## 2019-02-24 MED ORDER — TRASTUZUMAB CHEMO 150 MG IV SOLR
6.0000 mg/kg | Freq: Once | INTRAVENOUS | Status: AC
  Administered 2019-02-24: 399 mg via INTRAVENOUS
  Filled 2019-02-24: qty 19

## 2019-02-24 MED ORDER — HEPARIN SOD (PORK) LOCK FLUSH 100 UNIT/ML IV SOLN
500.0000 [IU] | Freq: Once | INTRAVENOUS | Status: AC | PRN
  Administered 2019-02-24: 500 [IU]
  Filled 2019-02-24: qty 5

## 2019-02-24 MED ORDER — ACETAMINOPHEN 325 MG PO TABS
650.0000 mg | ORAL_TABLET | Freq: Once | ORAL | Status: AC
  Administered 2019-02-24: 650 mg via ORAL

## 2019-02-24 NOTE — Patient Instructions (Signed)
[facility] Discharge Instructions for Patients Receiving Chemotherapy  Today you received the following chemotherapy agents Herceptin and Perjeta.   To help prevent nausea and vomiting after your treatment, we encourage you to take your nausea medication as directed.   If you develop nausea and vomiting that is not controlled by your nausea medication, call the clinic.   BELOW ARE SYMPTOMS THAT SHOULD BE REPORTED IMMEDIATELY:  *FEVER GREATER THAN 100.5 F  *CHILLS WITH OR WITHOUT FEVER  NAUSEA AND VOMITING THAT IS NOT CONTROLLED WITH YOUR NAUSEA MEDICATION  *UNUSUAL SHORTNESS OF BREATH  *UNUSUAL BRUISING OR BLEEDING  TENDERNESS IN MOUTH AND THROAT WITH OR WITHOUT PRESENCE OF ULCERS  *URINARY PROBLEMS  *BOWEL PROBLEMS  UNUSUAL RASH Items with * indicate a potential emergency and should be followed up as soon as possible.  Feel free to call the clinic should you have any questions or concerns. The clinic phone number is [phone].  Please show the CHEMO ALERT CARD at check-in to the Emergency Department and triage nurse.   

## 2019-03-04 ENCOUNTER — Encounter: Payer: Self-pay | Admitting: General Practice

## 2019-03-04 NOTE — Progress Notes (Signed)
[facility] Team contacted patient to assess for food insecurity and other psychosocial needs during current COVID19 pandemic.  Lives w husband who does all the errands for her.  Tries to stay out of crowds.  Feeling better since diarrhea post last treatment.    Patient/family expressed no needs at this time.  Support Team member encouraged patient to call if changes occur or they have any other questions/concerns.   Beverely Pace, [place]

## 2019-03-09 MED FILL — LORazepam 0.5 MG TABS: 0.5 | 30 days supply | Qty: 30 | Fill #2

## 2019-03-09 MED FILL — ONDANSETRON HCL 8 MG TABLET: 8 | 15 days supply | Qty: 30 | Fill #2

## 2019-04-06 MED FILL — LORazepam 0.5 MG TABS: 0.5 | 30 days supply | Qty: 30 | Fill #3

## 2019-04-06 MED FILL — LETROZOLE 2.5 MG TABLET: 2.5 | 90 days supply | Qty: 90 | Fill #1

## 2019-05-03 ENCOUNTER — Telehealth: Payer: Self-pay | Admitting: Adult Health

## 2019-05-03 NOTE — Telephone Encounter (Signed)
Moved SCP appt per sch msg. Called and left msg

## 2019-05-13 ENCOUNTER — Ambulatory Visit: Payer: Self-pay | Admitting: Surgery

## 2019-05-13 NOTE — H&P (Signed)
FELICITAS SINE Documented: 05/13/2019 9:36 AM Location: [facility] Patient #: [id number] DOB: Jan 04, 1946 Married / Language: English / Race: Black or African American Female  History of Present Illness [name] A. Win Guajardo MD; 05/13/2019 10:00 AM) Patient words: Patient returns for follow-up after treatment of her left breast cancer in March 2019 with neoadjuvant chemotherapy and subsequent breast conserving surgery and radiation therapy. She has completed therapy for HER-2/neu positivity. She has now antiestrogen and once her Port-A-Cath out. Otherwise, she is doing well. She does have some neuropathic left breast pain.                 Malignant neoplasm of upper-inner quadrant of left breast in female, estrogen receptor positive (HCC) 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, copy #16.3, axillary lymph node biopsy benign concordant, T2 N0 stage II a clinical stage AJCC 8  Treatment plan: 1. Neoadjuvant chemotherapy with TCH Perjeta x 3 cycles (discontinued early due to toxicities of severe diarrhea nausea and vomiting) 02/26/2018 to 04/15/2018 followed by Herceptin Perjeta maintenance for 1 year 2. followed by breast conserving surgery 08/12/2018: Pathologic complete response 3 followed by adjuvant radiation 09/14/2018-10/04/2018 4. Followed by adjuvant antiestrogen therapy with letrozole for 5-7 years started 11/11/2018.  The patient is a 73 year old female.   Problem List/Past Medical [name], CMA; 05/13/2019 9:37 AM) BREAST CANCER, LEFT (C50.912) POST-OPERATIVE STATE [phone])  Past Surgical History [name], CMA; 05/13/2019 9:37 AM) Appendectomy Breast Biopsy Bilateral. multiple Cataract Surgery Bilateral. Colon Polyp Removal - Colonoscopy Foot Surgery Bilateral. Hysterectomy (due to  cancer) - Complete Nephrectomy Left. Oral Surgery Shoulder Surgery Right. Tonsillectomy  Diagnostic Studies History Sharyn Lull R. [name], CMA; 05/13/2019 9:37 AM) Colonoscopy 1-5 years ago Mammogram within last year Pap Smear >5 years ago  Allergies Sharyn Lull R. [name], CMA; 05/13/2019 9:37 AM) Codeine/Codeine Derivatives  Medication History [name], [place]; 05/13/2019 9:37 AM) AmLODIPine Besylate (10MG Tablet, Oral) Active. Amoxicillin-Pot Clavulanate (875-125MG Tablet, Oral) Active. BuPROPion HCl ER (SR) (150MG Tablet ER 12HR, Oral) Active. Clotrimazole-Betamethasone (1-0.05% Cream, External) Active. Dexamethasone (4MG Tablet, Oral) Active. Diphenoxylate-Atropine (2.5-0.025MG Tablet, Oral) Active. HydrOXYzine HCl (50MG Tablet, Oral) Active. Latanoprost (0.005% Solution, Ophthalmic) Active. Lidocaine-Prilocaine (2.5-2.5% Cream, External) Active. LORazepam (0.5MG Tablet, Oral) Active. Omeprazole (20MG Capsule DR, Oral) Active. Ondansetron HCl (8MG Tablet, Oral) Active. Prochlorperazine Maleate (10MG Tablet, Oral) Active. Pramipexole Dihydrochloride (0.25MG Tablet, Oral) Active. HydroCHLOROthiazide (12.5MG Capsule, Oral) Active. TraZODone HCl (100MG Tablet, Oral) Active. Norvasc (5MG Tablet, Oral) Active. Zantac (150MG Tablet, Oral) Active. Medications Reconciled  Social History [name], CMA; 05/13/2019 9:37 AM) Caffeine use Carbonated beverages, Coffee, Tea. No alcohol use Tobacco use Current some day smoker.  Family History Sharyn Lull R. [name], CMA; 05/13/2019 9:37 AM) Alcohol Abuse Brother, Mother. Arthritis Brother, Sister. Breast Cancer Family Members In General. Cerebrovascular Accident Brother, Father. Cervical Cancer Family Members In General. Hypertension Brother, Sister. Kidney Disease Brother. Migraine Headache Son. Respiratory Condition Father.  Pregnancy / Birth History Sharyn Lull R. [name], CMA;  05/13/2019 9:37 AM) Age at menarche 55 years. Age of menopause <47 Contraceptive History Oral contraceptives. Gravida 2 Irregular periods Maternal age 46-20 Para 1  Other Problems [name], CMA; 05/13/2019 9:37 AM) Anxiety Disorder Arthritis Breast Cancer Chest pain Diverticulosis Gastroesophageal Reflux Disease General anesthesia - complications Hepatitis High blood pressure Lump In Breast Oophorectomy Bilateral. Transfusion history  Vitals [employer] R. Brooks CMA; 05/13/2019 9:37 AM) 05/13/2019 9:36 AM Weight: 133.38 lb Height: 63.5in Body Surface Area: 1.64 m Body Mass Index: 23.26 kg/m  Pulse: 88 (Regular)  BP: 132/80 (Sitting, Left Arm, Standard)      Physical Exam (Etan Vasudevan A. Durell Lofaso MD; 05/13/2019 10:00 AM)  General Mental Status-Alert. General Appearance-Consistent with stated age. Hydration-Well hydrated. Voice-Normal.  Head and Neck Head-normocephalic, atraumatic with no lesions or palpable masses. Trachea-midline. Thyroid Gland Characteristics - normal size and consistency.  Chest and Lung Exam Chest and lung exam reveals -quiet, even and easy respiratory effort with no use of accessory muscles and on auscultation, normal breath sounds, no adventitious sounds and normal vocal resonance. Inspection Chest Wall - Normal. Back - normal.  Breast Note: Left breast shows postsurgical radiation changes. No masses. Left axilla is normal. Right breast normal right-sided Port-A-Cath in place  Neurologic Neurologic evaluation reveals -alert and oriented x 3 with no impairment of recent or remote memory. Mental Status-Normal.  Lymphatic Head & Neck  General Head & Neck Lymphatics: Bilateral - Description - Normal. Axillary  General Axillary Region: Bilateral - Description - Normal. Tenderness - Non Tender.    Assessment & Plan (Olia Hinderliter A. Jordane Hisle MD; 05/13/2019 10:01 AM)  HISTORY OF BREAST  CANCER (Z85.3) Impression: Scheduled for port removal Gabapentin 300 mg by mouth 3 times a day for neuropathic pain. Risks and benefits of removal discussed.  Current Plans Pt Education - CCS Free Text Education/Instructions: discussed with patient and provided information. I recommended surgery to remove the catheter. I explained the technique of removal with use of local anesthesia & possible need for more aggressive sedation/anesthesia for patient comfort.  Risks such as bleeding, infection, and other risks were discussed. Post-operative dressing/incision care was discussed. I noted a good likelihood this will help address the problem. We will work to minimize complications. Questions were answered. The patient expresses understanding & wishes to proceed with surgery.  Started Gabapentin 300 MG Oral Capsule, 1 (one) Capsule three times daily, #20, 05/13/2019, No Refill.

## 2019-05-13 NOTE — H&P (View-Only) (Signed)
[name] Documented: 05/13/2019 9:36 AM Location: [facility] Patient #: [id number] DOB: [birth year] Married / Language: English / Race: Black or African American Female  History of Present Illness [name] A. Michelle Zilberman MD; 05/13/2019 10:00 AM) Patient words: Patient returns for follow-up after treatment of her left breast cancer in March 2019 with neoadjuvant chemotherapy and subsequent breast conserving surgery and radiation therapy. She has completed therapy for HER-2/neu positivity. She has now antiestrogen and once her Port-A-Cath out. Otherwise, she is doing well. She does have some neuropathic left breast pain.                 Malignant neoplasm of upper-inner quadrant of left breast in female, estrogen receptor positive (HCC) 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, copy #16.3, axillary lymph node biopsy benign concordant, T2 N0 stage II a clinical stage AJCC 8  Treatment plan: 1. Neoadjuvant chemotherapy with TCH Perjeta x 3 cycles (discontinued early due to toxicities of severe diarrhea nausea and vomiting) 02/26/2018 to 04/15/2018 followed by Herceptin Perjeta maintenance for 1 year 2. followed by breast conserving surgery 08/12/2018: Pathologic complete response 3 followed by adjuvant radiation 09/14/2018-10/04/2018 4. Followed by adjuvant antiestrogen therapy with letrozole for 5-7 years started 11/11/2018.  The patient is a 73 year old female.   Problem List/Past Medical [name], CMA; 05/13/2019 9:37 AM) BREAST CANCER, LEFT (C50.912) POST-OPERATIVE STATE [phone])  Past Surgical History [name], CMA; 05/13/2019 9:37 AM) Appendectomy Breast Biopsy Bilateral. multiple Cataract Surgery Bilateral. Colon Polyp Removal - Colonoscopy Foot Surgery Bilateral. Hysterectomy (due to  cancer) - Complete Nephrectomy Left. Oral Surgery Shoulder Surgery Right. Tonsillectomy  Diagnostic Studies History Michelle Lull R. [name], CMA; 05/13/2019 9:37 AM) Colonoscopy 1-5 years ago Mammogram within last year Pap Smear >5 years ago  Allergies Michelle Lull R. [name], CMA; 05/13/2019 9:37 AM) Codeine/Codeine Derivatives  Medication History [name], [place]; 05/13/2019 9:37 AM) AmLODIPine Besylate (10MG Tablet, Oral) Active. Amoxicillin-Pot Clavulanate (875-125MG Tablet, Oral) Active. BuPROPion HCl ER (SR) (150MG Tablet ER 12HR, Oral) Active. Clotrimazole-Betamethasone (1-0.05% Cream, External) Active. Dexamethasone (4MG Tablet, Oral) Active. Diphenoxylate-Atropine (2.5-0.025MG Tablet, Oral) Active. HydrOXYzine HCl (50MG Tablet, Oral) Active. Latanoprost (0.005% Solution, Ophthalmic) Active. Lidocaine-Prilocaine (2.5-2.5% Cream, External) Active. LORazepam (0.5MG Tablet, Oral) Active. Omeprazole (20MG Capsule DR, Oral) Active. Ondansetron HCl (8MG Tablet, Oral) Active. Prochlorperazine Maleate (10MG Tablet, Oral) Active. Pramipexole Dihydrochloride (0.25MG Tablet, Oral) Active. HydroCHLOROthiazide (12.5MG Capsule, Oral) Active. TraZODone HCl (100MG Tablet, Oral) Active. Norvasc (5MG Tablet, Oral) Active. Zantac (150MG Tablet, Oral) Active. Medications Reconciled  Social History [name], CMA; 05/13/2019 9:37 AM) Caffeine use Carbonated beverages, Coffee, Tea. No alcohol use Tobacco use Current some day smoker.  Family History Michelle Lull R. [name], CMA; 05/13/2019 9:37 AM) Alcohol Abuse Brother, Mother. Arthritis Brother, Sister. Breast Cancer Family Members In General. Cerebrovascular Accident Brother, Father. Cervical Cancer Family Members In General. Hypertension Brother, Sister. Kidney Disease Brother. Migraine Headache Son. Respiratory Condition Father.  Pregnancy / Birth History Michelle Lull R. [name], CMA;  05/13/2019 9:37 AM) Age at menarche 61 years. Age of menopause <47 Contraceptive History Oral contraceptives. Gravida 2 Irregular periods Maternal age 64-20 Para 1  Other Problems [name], CMA; 05/13/2019 9:37 AM) Anxiety Disorder Arthritis Breast Cancer Chest pain Diverticulosis Gastroesophageal Reflux Disease General anesthesia - complications Hepatitis High blood pressure Lump In Breast Oophorectomy Bilateral. Transfusion history  Vitals [employer] R. Bradshaw CMA; 05/13/2019 9:37 AM) 05/13/2019 9:36 AM Weight: 133.38 lb Height: 63.5in Body Surface Area: 1.64 m Body Mass Index: 23.26 kg/m  Pulse: 88 (Regular)  BP: 132/80 (Sitting, Left Arm, Standard)      Physical Exam (Ellajane Stong A. Fergie Sherbert MD; 05/13/2019 10:00 AM)  General Mental Status-Alert. General Appearance-Consistent with stated age. Hydration-Well hydrated. Voice-Normal.  Head and Neck Head-normocephalic, atraumatic with no lesions or palpable masses. Trachea-midline. Thyroid Gland Characteristics - normal size and consistency.  Chest and Lung Exam Chest and lung exam reveals -quiet, even and easy respiratory effort with no use of accessory muscles and on auscultation, normal breath sounds, no adventitious sounds and normal vocal resonance. Inspection Chest Wall - Normal. Back - normal.  Breast Note: Left breast shows postsurgical radiation changes. No masses. Left axilla is normal. Right breast normal right-sided Port-A-Cath in place  Neurologic Neurologic evaluation reveals -alert and oriented x 3 with no impairment of recent or remote memory. Mental Status-Normal.  Lymphatic Head & Neck  General Head & Neck Lymphatics: Bilateral - Description - Normal. Axillary  General Axillary Region: Bilateral - Description - Normal. Tenderness - Non Tender.    Assessment & Plan (Cheron Pasquarelli A. Leonia Heatherly MD; 05/13/2019 10:01 AM)  HISTORY OF BREAST  CANCER (Z85.3) Impression: Scheduled for port removal Gabapentin 300 mg by mouth 3 times a day for neuropathic pain. Risks and benefits of removal discussed.  Current Plans Pt Education - CCS Free Text Education/Instructions: discussed with patient and provided information. I recommended surgery to remove the catheter. I explained the technique of removal with use of local anesthesia & possible need for more aggressive sedation/anesthesia for patient comfort.  Risks such as bleeding, infection, and other risks were discussed. Post-operative dressing/incision care was discussed. I noted a good likelihood this will help address the problem. We will work to minimize complications. Questions were answered. The patient expresses understanding & wishes to proceed with surgery.  Started Gabapentin 300 MG Oral Capsule, 1 (one) Capsule three times daily, #20, 05/13/2019, No Refill.

## 2019-05-16 ENCOUNTER — Ambulatory Visit: Payer: Medicare HMO | Admitting: Hematology and Oncology

## 2019-05-24 ENCOUNTER — Telehealth: Payer: Self-pay | Admitting: Adult Health

## 2019-05-24 NOTE — Telephone Encounter (Signed)
Called patient regarding upcoming Webex appointment, left patient a voicemail and due to no communication to set up virtual visit this will be a telephone visit.

## 2019-05-24 NOTE — Telephone Encounter (Signed)
Returned patient's phone call regarding rescheduling an appointment for 06/24. Appointment has now been moved to 06/29.

## 2019-05-25 ENCOUNTER — Inpatient Hospital Stay: Payer: Medicare HMO | Admitting: Adult Health

## 2019-05-27 ENCOUNTER — Telehealth: Payer: Self-pay | Admitting: Adult Health

## 2019-05-27 ENCOUNTER — Encounter: Payer: Medicare HMO | Admitting: Adult Health

## 2019-05-27 NOTE — Telephone Encounter (Signed)
I was unable to reach pt to verify telephone visit because no one answered the phone; no voicemail.

## 2019-05-30 ENCOUNTER — Inpatient Hospital Stay: Payer: Medicare HMO | Attending: Adult Health | Admitting: Adult Health

## 2019-05-30 ENCOUNTER — Encounter: Payer: Self-pay | Admitting: Adult Health

## 2019-05-30 ENCOUNTER — Other Ambulatory Visit: Payer: Self-pay | Admitting: Hematology and Oncology

## 2019-05-30 DIAGNOSIS — Z79811 Long term (current) use of aromatase inhibitors: Secondary | ICD-10-CM

## 2019-05-30 DIAGNOSIS — C50212 Malignant neoplasm of upper-inner quadrant of left female breast: Secondary | ICD-10-CM | POA: Diagnosis not present

## 2019-05-30 DIAGNOSIS — Z923 Personal history of irradiation: Secondary | ICD-10-CM

## 2019-05-30 DIAGNOSIS — Z170 Estrogen receptor positive status [ER+]: Secondary | ICD-10-CM | POA: Diagnosis not present

## 2019-05-30 DIAGNOSIS — Z9221 Personal history of antineoplastic chemotherapy: Secondary | ICD-10-CM

## 2019-05-30 DIAGNOSIS — E2839 Other primary ovarian failure: Secondary | ICD-10-CM | POA: Diagnosis not present

## 2019-05-30 DIAGNOSIS — F17200 Nicotine dependence, unspecified, uncomplicated: Secondary | ICD-10-CM | POA: Diagnosis not present

## 2019-05-30 DIAGNOSIS — R112 Nausea with vomiting, unspecified: Secondary | ICD-10-CM

## 2019-05-30 MED FILL — ONDANSETRON HCL 8 MG TABLET: 8 | 15 days supply | Qty: 30 | Fill #3

## 2019-05-30 MED FILL — LORazepam 0.5 MG TABS: 0.5 | 30 days supply | Qty: 30 | Fill #0

## 2019-05-30 NOTE — Progress Notes (Signed)
SURVIVORSHIP VIRTUAL VISIT:  I connected with [name] on 05/30/19 at  1:00 PM EDT by telephone and verified that I am speaking with the correct person using two identifiers.   I discussed the limitations, risks, security and privacy concerns of performing an evaluation and management service by telephone and the availability of in person appointments. I also discussed with the patient that there may be a patient responsible charge related to this service. The patient expressed understanding and agreed to proceed.   BRIEF ONCOLOGIC HISTORY:  Oncology History  Malignant neoplasm of upper-inner quadrant of left breast in female, estrogen receptor positive ([place])  02/03/2018 Initial Diagnosis   Left breast palpable lump at 1130 position 2.3 cm, at 11 o'clock position 6 mm which was benign; biopsy revealed IDC grade 3 ER 30%, PR 0%, Ki-67 30%, HER-2 positive ratio 5.62, copy #16.3, axillary lymph node biopsy benign concordant, T2 N0 stage II a clinical stage AJCC 8   02/11/2018 Genetic Testing   Negative. Two Variants of Uncertain Significance were detected: ALK c.1190A>T (p.Asp397Val) and PMS2 c.1058C>T (p.Ala353Val).  Genes tested include: ALK, APC, ATM, AXIN2, BAP1, BARD1, BLM, BMPR1A, BRCA1, BRCA2, BRIP1, CASR, CDC73, CDH1, CDK4, CDKN1B, CDKN1C, CDKN2A, CEBPA, CHEK2, CTNNA1, DICER1, DIS3L2, EGFR, EPCAM, FH, FLCN, GATA2, GPC3, GREM1, HOXB13, HRAS, KIT, MAX, MEN1, MET, MITF, MLH1, MSH2, MSH3, MSH6, MUTYH, NBN, NF1, NF2, NTHL1, PALB2, PDGFRA, PHOX2B, PMS2, POLD1, POLE, POT1, PRKAR1A, PTCH1, PTEN, RAD50, RAD51C, RAD51D, RB1, RECQL4, RET, RUNX1, SDHA, SDHAF2, SDHB, SDHC, SDHD, SMAD4, SMARCA4, SMARCB1, SMARCE1, STK11, SUFU, TERC, TERT, TMEM127, TP53, TSC1, TSC2, VHL, WRN, WT1.   02/18/2018 Breast MRI   Solitary mass in left breast upper outer quadrant 2.5 cm.  No additional enhancement in the right breast.  No adenopathy   02/26/2018 - 04/15/2018 Neo-Adjuvant Chemotherapy   TCH Perjeta X 3 cycles (stopped  early due to toxicities) followed by Herceptin Perjeta maintenance   06/18/2018 Breast MRI   Marked reduction in the abnormal enhancement in the upper inner left breast at the site of known cancer.  There is approximately 2.5 cm of residual linear non mass enhancement at the site of known cancer.   08/12/2018 Surgery   Left lumpectomy: No residual cancer identified 0/4 lymph nodes negative, complete pathologic response   09/14/2018 - 10/04/2018 Radiation Therapy   Adjuvant radiation therapy   Left Breast / 42.56 Gy in 16 fractions   11/11/2018 -  Anti-estrogen oral therapy   Letrozole 2.82m daily     INTERVAL HISTORY:  [name] review her survivorship care plan detailing her treatment course for breast cancer, as well as monitoring long-term side effects of that treatment, education regarding health maintenance, screening, and overall wellness and health promotion.     Overall, Ms. RClinganreports feeling quite well.  She notes that initially when she started the Letrozole, she was nauseated, however she changed to taking the Letrozole to at night and the nausea has resolved.  She denies hot flashes or vaginal dryness.  She does have some arthralgias, and she is managing this moderately well.  [name] exercising regularly.  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    [name] continued to have some breast pain associated with her surgery.  She saw Dr. CBrantley Stagefor this, and was started on Gabapentin which was helping her pain.  BKalynanotes that she continues to be fatigued.  She says that she has increased shortness of breath when wearing a mask in  public.    REVIEW OF SYSTEMS:  Review of Systems  Constitutional: Negative for appetite change, chills, fatigue, fever and unexpected weight change.  HENT:   Negative for hearing loss, lump/mass, sore throat and trouble swallowing.   Eyes: Negative for eye problems and icterus.  Respiratory: Negative for chest  tightness, cough and shortness of breath.   Cardiovascular: Negative for chest pain, leg swelling and palpitations.  Gastrointestinal: Negative for abdominal distention, blood in stool, constipation, diarrhea, nausea and vomiting.  Endocrine: Negative for hot flashes.  Musculoskeletal: Positive for arthralgias.  Skin: Negative for itching and rash.  Neurological: Negative for dizziness, extremity weakness and headaches.  Hematological: Negative for adenopathy. Does not bruise/bleed easily.  Psychiatric/Behavioral: Negative for depression. The patient is not nervous/anxious.      ONCOLOGY TREATMENT TEAM:  1. Surgeon:  Dr. Brantley Stage at [facility] 2. Medical Oncologist: Dr. Lindi Adie  3. Radiation Oncologist: Dr. Isidore Moos    PAST MEDICAL/SURGICAL HISTORY:  Past Medical History:  Diagnosis Date  . Anxiety   . Arthritis   . Breast cancer ([place])   . Cough   . Family history of breast cancer   . Genetic testing 02/19/2018   Multi-Cancer panel (83 genes) @ [facility] - No pathogenic mutations detected  . GERD (gastroesophageal reflux disease)   . History of radiation therapy 09/13/18- 10/04/18   Left Breast 42.56 Gy in 16 fractions.   . Hypertension   . RLS (restless legs syndrome)    Past Surgical History:  Procedure Laterality Date  . ABDOMINAL HYSTERECTOMY     1970's due to ectopic pregnancy  . BREAST BIOPSY Left 2016  . BREAST LUMPECTOMY WITH RADIOACTIVE SEED AND SENTINEL LYMPH NODE BIOPSY Left 08/12/2018   Procedure: LEFT BREAST LUMPECTOMY WITH RADIOACTIVE SEED AND SENTINEL LYMPH NODE BIOPSY;  Surgeon: Erroll Luna, MD;  Location: [place];  Service: General;  Laterality: Left;  . BREAST SURGERY     cyst removal  . CARPOMETACARPEL SUSPENSION PLASTY Right 10/30/2016   Procedure: SUSPENSION PLASTY abductor pollicis longus transfer excision trapezium right;  Surgeon: Daryll Brod, MD;  Location: [facility];  Service: Orthopedics;  Laterality:  Right;  axillary block  SUSPENSION PLASTY abductor pollicis longus transfer excision trapezium right  . KIDNEY DONATION Left   . PORTACATH PLACEMENT Right 02/24/2018   Procedure: INSERTION OF RIGHT INTERNAL JUGULAR PORT-A-CATH WITH ULTRA SOUND GUIDANCE ERAS PATHWAY;  Surgeon: Erroll Luna, MD;  Location: [facility];  Service: General;  Laterality: Right;  . WRIST SURGERY     cyst removal     ALLERGIES:  Allergies  Allergen Reactions  . Codeine Nausea And Vomiting  . Demerol [Meperidine] Nausea And Vomiting     CURRENT MEDICATIONS:  Outpatient Encounter Medications as of 05/30/2019  Medication Sig  . amLODipine (NORVASC) 10 MG tablet Take 10 mg by mouth daily.   . hydrochlorothiazide (HYDRODIURIL) 25 MG tablet Take 1 tablet (25 mg total) by mouth daily.  . hydrOXYzine (ATARAX/VISTARIL) 50 MG tablet Take 50 mg by mouth 3 (three) times daily as needed for anxiety.   Marland Kitchen latanoprost (XALATAN) 0.005 % ophthalmic solution Place 1 drop into both eyes at bedtime.   Marland Kitchen letrozole (FEMARA) 2.5 MG tablet Take 1 tablet (2.5 mg total) by mouth daily.  Marland Kitchen lidocaine-prilocaine (EMLA) cream Apply to affected area once  . LORazepam (ATIVAN) 0.5 MG tablet Take 1 tablet (0.5 mg total) by mouth at bedtime as needed for anxiety.  . ondansetron (ZOFRAN) 8 MG tablet Take 1  tablet (8 mg total) by mouth 2 (two) times daily as needed for refractory nausea / vomiting.  . pramipexole (MIRAPEX) 0.25 MG tablet Take 0.25-1 mg by mouth at bedtime.   . prochlorperazine (COMPAZINE) 10 MG tablet Take 1 tablet (10 mg total) by mouth every 6 (six) hours as needed (Nausea or vomiting).  . triamcinolone cream (KENALOG) 0.1 % Apply 1 application topically 3 (three) times daily.   No facility-administered encounter medications on file as of 05/30/2019.      ONCOLOGIC FAMILY HISTORY:  Family History  Problem Relation Age of Onset  . Liver cancer Mother        deceased 51  . Lung cancer Father 56       deceased 51; smoker  .  Stomach cancer Maternal Grandmother        deceased 38  . Cancer Paternal Uncle        deceased 44; unk. type  . Breast cancer Maternal Aunt        dx 57s; deceased 2s  . Breast cancer Maternal Aunt        dx 57s; deceased 3s  . Breast cancer Cousin        daughter of mat aunt with breast ca     GENETIC COUNSELING/TESTING: Negative, see above  SOCIAL HISTORY:  Social History   Socioeconomic History  . Marital status: Married    Spouse name: Not on file  . Number of children: Not on file  . Years of education: Not on file  . Highest education level: Not on file  Occupational History  . Not on file  Social Needs  . Financial resource strain: Not on file  . Food insecurity    Worry: Not on file    Inability: Not on file  . Transportation needs    Medical: No    Non-medical: No  Tobacco Use  . Smoking status: Current Every Day Smoker    Packs/day: 0.50    Years: 47.00    Pack years: 23.50    Types: Cigarettes  . Smokeless tobacco: Never Used  Substance and Sexual Activity  . Alcohol use: No  . Drug use: No  . Sexual activity: Not on file  Lifestyle  . Physical activity    Days per week: Not on file    Minutes per session: Not on file  . Stress: Not on file  Relationships  . Social Herbalist on phone: Not on file    Gets together: Not on file    Attends religious service: Not on file    Active member of club or organization: Not on file    Attends meetings of clubs or organizations: Not on file    Relationship status: Not on file  . Intimate partner violence    Fear of current or ex partner: No    Emotionally abused: No    Physically abused: No    Forced sexual activity: No  Other Topics Concern  . Not on file  Social History Narrative  . Not on file     OBSERVATIONS/OBJECTIVE:  Patient sounds well.  In no apparent distress.  Breathing non labored.  Mood and behavior are normal.   LABORATORY DATA:  None for this visit.  DIAGNOSTIC  IMAGING:  None for this visit.      ASSESSMENT AND PLAN:  [name] is a pleasant 73 y.o. female with Stage IIA left breast invasive ductal carcinoma, ER+/PR-/HER2+, diagnosed in 01/2018, treated with neoadjuvatn  chemotherapy, lumpectomy, adjuvant radiation therapy, and anti-estrogen therapy with Letrozole beginning in 10/2018.  She presents to the Survivorship Clinic for our initial meeting and routine follow-up post-completion of treatment for breast cancer.    1. Stage IIA left breast cancer:  [name] is continuing to recover from definitive treatment for breast cancer. She will follow-up with her medical oncologist, Dr. Lindi Adie in 11/2019 with history and physical exam per surveillance protocol.  She will continue her anti-estrogen therapy with Letrozole. Thus far, she is tolerating the Letrozole moderately well. . Her mammogram is due; orders placed today.  Today, a comprehensive survivorship care plan and treatment summary was reviewed with the patient today detailing her breast cancer diagnosis, treatment course, potential late/long-term effects of treatment, appropriate follow-up care with recommendations for the future, and patient education resources.  A copy of this summary, along with a letter will be sent to the patient's primary care provider via mail/fax/In Basket message after today's visit.    2. Arthralgias: I recommended continued exercise and yoga for her joint aches and pains.  3. Fatigue: Reviewed how exercise can help.  Should it persist, she may want to back down to taking the Gabapentin to BID instead of TID.    4. Bone health:  Given [name]'s age/history of breast cancer and her current treatment regimen including anti-estrogen therapy with Letrozole, she is at risk for bone demineralization.  Her last DEXA scan was in 2016, which showed osteopenia with a T score of -1.8 in the left femur.  I ordered repeat bone density to be done when she undergoes her next mammogram.   In the meantime, she was encouraged to increase her consumption of foods rich in calcium, as well as increase her weight-bearing activities.  She was given education on specific activities to promote bone health.  5. Cancer screening:  Due to [name]'s history and her age, she should receive screening for skin cancers, colon cancer, and gynecologic cancers.  The information and recommendations are listed on the patient's comprehensive care plan/treatment summary and were reviewed in detail with the patient.  I also reviewed with Bradshaw the indication with her to undergo lung cancer screening, and placed orders for low dose CT scan.  She has a 55 pack year tobacco history, and currently smokes every day.  6. Health maintenance and wellness promotion: [name] was encouraged to consume 5-7 servings of fruits and vegetables per day. We reviewed the "Nutrition Rainbow" handout, as well as the handout "Take Control of Your Health and Reduce Your Cancer Risk" from the [employer].  She was also encouraged to engage in moderate to vigorous exercise for 30 minutes per day most days of the week. We discussed the [employer] fitness program, which is designed for cancer survivors to help them become more physically fit after cancer treatments.  She was instructed to limit her alcohol consumption and was encouraged stop smoking.     7. Support services/counseling: It is not uncommon for this period of the patient's cancer care trajectory to be one of many emotions and stressors.  We discussed how this can be increasingly difficult during the times of quarantine and social distancing due to the COVID-19 pandemic.   She was given information regarding our available services and encouraged to contact me with any questions or for help enrolling in any of our support group/programs.    Follow up instructions:    -Return to cancer Bradshaw in 11/2019 for f/u with Dr.  Gudena  -Mammogram due in  06/2019 -Bone Density due in 06/2019 -Lung Cancer screening with low dose chest CT -Follow up with surgery every June.   -She is welcome to return back to the Survivorship Clinic at any time; no additional follow-up needed at this time.  -Consider referral back to survivorship as a long-term survivor for continued surveillance  The patient was provided an opportunity to ask questions and all were answered. The patient agreed with the plan and demonstrated an understanding of the instructions.   The patient was advised to call back or seek an in-person evaluation if the symptoms worsen or if the condition fails to improve as anticipated.   I provided 30 minutes of non face-to-face telephone visit time during this encounter, and > 50% was spent counseling as documented under my assessment & plan.  Scot Dock, NP

## 2019-05-31 ENCOUNTER — Telehealth: Payer: Self-pay | Admitting: Hematology and Oncology

## 2019-05-31 NOTE — Telephone Encounter (Signed)
I tried to reach she wasn't available I will mail

## 2019-06-01 ENCOUNTER — Encounter (HOSPITAL_BASED_OUTPATIENT_CLINIC_OR_DEPARTMENT_OTHER): Payer: Self-pay | Admitting: *Deleted

## 2019-06-01 ENCOUNTER — Other Ambulatory Visit: Payer: Self-pay

## 2019-06-06 ENCOUNTER — Encounter (HOSPITAL_BASED_OUTPATIENT_CLINIC_OR_DEPARTMENT_OTHER)
Admission: RE | Admit: 2019-06-06 | Discharge: 2019-06-06 | Disposition: A | Discharge status: Home / Self Care | Payer: Medicare HMO | Source: Ambulatory Visit | Attending: Surgery | Admitting: Surgery

## 2019-06-06 ENCOUNTER — Other Ambulatory Visit (HOSPITAL_COMMUNITY)
Admission: RE | Admit: 2019-06-06 | Discharge: 2019-06-06 | Disposition: A | Discharge status: Home / Self Care | Payer: Medicare HMO | Source: Ambulatory Visit | Attending: Surgery | Admitting: Surgery

## 2019-06-06 ENCOUNTER — Other Ambulatory Visit: Payer: Self-pay

## 2019-06-06 DIAGNOSIS — I10 Essential (primary) hypertension: Secondary | ICD-10-CM | POA: Insufficient documentation

## 2019-06-06 DIAGNOSIS — Z01818 Encounter for other preprocedural examination: Secondary | ICD-10-CM | POA: Diagnosis present

## 2019-06-06 DIAGNOSIS — Z1159 Encounter for screening for other viral diseases: Secondary | ICD-10-CM | POA: Insufficient documentation

## 2019-06-06 LAB — CBC WITH DIFFERENTIAL/PLATELET
Abs Immature Granulocytes: 0.03 10*3/uL (ref 0.00–0.07)
Basophils Absolute: 0.1 10*3/uL (ref 0.0–0.1)
Basophils Relative: 1 %
Eosinophils Absolute: 0.1 10*3/uL (ref 0.0–0.5)
Eosinophils Relative: 1 %
HCT: 33 % — ABNORMAL LOW (ref 36.0–46.0)
Hemoglobin: 11 g/dL — ABNORMAL LOW (ref 12.0–15.0)
Immature Granulocytes: 0 %
Lymphocytes Relative: 23 %
Lymphs Abs: 2.4 10*3/uL (ref 0.7–4.0)
MCH: 29.3 pg (ref 26.0–34.0)
MCHC: 33.3 g/dL (ref 30.0–36.0)
MCV: 87.8 fL (ref 80.0–100.0)
Monocytes Absolute: 0.9 10*3/uL (ref 0.1–1.0)
Monocytes Relative: 8 %
Neutro Abs: 6.9 10*3/uL (ref 1.7–7.7)
Neutrophils Relative %: 67 %
Platelets: 408 10*3/uL — ABNORMAL HIGH (ref 150–400)
RBC: 3.76 MIL/uL — ABNORMAL LOW (ref 3.87–5.11)
RDW: 14.9 % (ref 11.5–15.5)
WBC: 10.3 10*3/uL (ref 4.0–10.5)
nRBC: 0 % (ref 0.0–0.2)

## 2019-06-06 LAB — SARS CORONAVIRUS 2 (TAT 6-24 HRS): SARS Coronavirus 2: NEGATIVE

## 2019-06-06 LAB — COMPREHENSIVE METABOLIC PANEL
ALT: 12 U/L (ref 0–44)
AST: 19 U/L (ref 15–41)
Albumin: 4 g/dL (ref 3.5–5.0)
Alkaline Phosphatase: 79 U/L (ref 38–126)
Anion gap: 11 (ref 5–15)
BUN: 20 mg/dL (ref 8–23)
CO2: 22 mmol/L (ref 22–32)
Calcium: 9.3 mg/dL (ref 8.9–10.3)
Chloride: 105 mmol/L (ref 98–111)
Creatinine, Ser: 1.14 mg/dL — ABNORMAL HIGH (ref 0.44–1.00)
GFR calc Af Amer: 56 mL/min — ABNORMAL LOW (ref >60)
GFR calc non Af Amer: 48 mL/min — ABNORMAL LOW (ref >60)
Glucose, Bld: 101 mg/dL — ABNORMAL HIGH (ref 70–99)
Potassium: 4.6 mmol/L (ref 3.5–5.1)
Sodium: 138 mmol/L (ref 135–145)
Total Bilirubin: 0.5 mg/dL (ref 0.3–1.2)
Total Protein: 7.8 g/dL (ref 6.5–8.1)

## 2019-06-06 NOTE — Progress Notes (Signed)
Ensure pre surgery drink given with instructions to complete by 1200 dos, pt verbalized understanding. 

## 2019-06-09 ENCOUNTER — Ambulatory Visit (HOSPITAL_BASED_OUTPATIENT_CLINIC_OR_DEPARTMENT_OTHER): Payer: Medicare HMO | Admitting: Anesthesiology

## 2019-06-09 ENCOUNTER — Encounter (HOSPITAL_BASED_OUTPATIENT_CLINIC_OR_DEPARTMENT_OTHER)
Admission: RE | Disposition: A | Discharge status: Home / Self Care | Payer: Self-pay | Source: Home / Self Care | Attending: Surgery

## 2019-06-09 ENCOUNTER — Encounter (HOSPITAL_BASED_OUTPATIENT_CLINIC_OR_DEPARTMENT_OTHER): Payer: Self-pay | Admitting: Anesthesiology

## 2019-06-09 ENCOUNTER — Ambulatory Visit (HOSPITAL_BASED_OUTPATIENT_CLINIC_OR_DEPARTMENT_OTHER)
Admission: RE | Admit: 2019-06-09 | Discharge: 2019-06-09 | Disposition: A | Discharge status: Home / Self Care | Payer: Medicare HMO | Attending: Surgery | Admitting: Surgery

## 2019-06-09 DIAGNOSIS — Z170 Estrogen receptor positive status [ER+]: Secondary | ICD-10-CM | POA: Diagnosis not present

## 2019-06-09 DIAGNOSIS — Z9221 Personal history of antineoplastic chemotherapy: Secondary | ICD-10-CM | POA: Insufficient documentation

## 2019-06-09 DIAGNOSIS — G629 Polyneuropathy, unspecified: Secondary | ICD-10-CM | POA: Insufficient documentation

## 2019-06-09 DIAGNOSIS — Z9889 Other specified postprocedural states: Secondary | ICD-10-CM | POA: Insufficient documentation

## 2019-06-09 DIAGNOSIS — C50212 Malignant neoplasm of upper-inner quadrant of left female breast: Secondary | ICD-10-CM | POA: Diagnosis present

## 2019-06-09 DIAGNOSIS — Z79899 Other long term (current) drug therapy: Secondary | ICD-10-CM | POA: Insufficient documentation

## 2019-06-09 DIAGNOSIS — Z452 Encounter for adjustment and management of vascular access device: Secondary | ICD-10-CM | POA: Insufficient documentation

## 2019-06-09 DIAGNOSIS — Z7951 Long term (current) use of inhaled steroids: Secondary | ICD-10-CM | POA: Diagnosis not present

## 2019-06-09 DIAGNOSIS — F17210 Nicotine dependence, cigarettes, uncomplicated: Secondary | ICD-10-CM | POA: Diagnosis not present

## 2019-06-09 DIAGNOSIS — K219 Gastro-esophageal reflux disease without esophagitis: Secondary | ICD-10-CM | POA: Insufficient documentation

## 2019-06-09 DIAGNOSIS — Z885 Allergy status to narcotic agent status: Secondary | ICD-10-CM | POA: Insufficient documentation

## 2019-06-09 HISTORY — PX: PORT-A-CATH REMOVAL: SHX5289

## 2019-06-09 SURGERY — REMOVAL PORT-A-CATH
Anesthesia: Monitor Anesthesia Care | Site: Chest | Laterality: Right

## 2019-06-09 MED ORDER — GABAPENTIN 300 MG PO CAPS
300.0000 mg | ORAL_CAPSULE | ORAL | Status: AC
  Administered 2019-06-09: 300 mg via ORAL

## 2019-06-09 MED ORDER — OXYCODONE HCL 5 MG/5ML PO SOLN: 5.0000 mg | Freq: Once | ORAL | Status: DC | PRN

## 2019-06-09 MED ORDER — LIDOCAINE 2% (20 MG/ML) 5 ML SYRINGE
INTRAMUSCULAR | Status: DC | PRN
  Administered 2019-06-09: 20 mg via INTRAVENOUS

## 2019-06-09 MED ORDER — PROMETHAZINE HCL 25 MG/ML IJ SOLN: 6.2500 mg | INTRAMUSCULAR | Status: DC | PRN

## 2019-06-09 MED ORDER — FENTANYL CITRATE (PF) 100 MCG/2ML IJ SOLN
50.0000 ug | INTRAMUSCULAR | Status: DC | PRN
  Administered 2019-06-09 (×2): 50 ug via INTRAVENOUS

## 2019-06-09 MED ORDER — HYDROMORPHONE HCL 1 MG/ML IJ SOLN: 0.2500 mg | INTRAMUSCULAR | Status: DC | PRN

## 2019-06-09 MED ORDER — ONDANSETRON HCL 4 MG/2ML IJ SOLN
INTRAMUSCULAR | Status: AC
  Filled 2019-06-09: qty 2

## 2019-06-09 MED ORDER — CEFAZOLIN SODIUM-DEXTROSE 2-4 GM/100ML-% IV SOLN
INTRAVENOUS | Status: AC
  Filled 2019-06-09: qty 100

## 2019-06-09 MED ORDER — DEXTROSE 5 % IV SOLN
3.0000 g | INTRAVENOUS | Status: AC
  Administered 2019-06-09: 09:00:00 2 g via INTRAVENOUS

## 2019-06-09 MED ORDER — PROPOFOL 10 MG/ML IV BOLUS
INTRAVENOUS | Status: DC | PRN
  Administered 2019-06-09 (×2): 20 mg via INTRAVENOUS

## 2019-06-09 MED ORDER — BUPIVACAINE-EPINEPHRINE (PF) 0.25% -1:200000 IJ SOLN
INTRAMUSCULAR | Status: AC
  Filled 2019-06-09: qty 150

## 2019-06-09 MED ORDER — SCOPOLAMINE 1 MG/3DAYS TD PT72: 1.0000 | MEDICATED_PATCH | Freq: Once | TRANSDERMAL | Status: DC

## 2019-06-09 MED ORDER — BUPIVACAINE-EPINEPHRINE 0.25% -1:200000 IJ SOLN
INTRAMUSCULAR | Status: DC | PRN
  Administered 2019-06-09: 10 mL

## 2019-06-09 MED ORDER — CHLORHEXIDINE GLUCONATE CLOTH 2 % EX PADS: 6.0000 | MEDICATED_PAD | Freq: Once | CUTANEOUS | Status: DC

## 2019-06-09 MED ORDER — ACETAMINOPHEN 500 MG PO TABS
ORAL_TABLET | ORAL | Status: AC
  Filled 2019-06-09: qty 2

## 2019-06-09 MED ORDER — MIDAZOLAM HCL 2 MG/2ML IJ SOLN: 1.0000 mg | INTRAMUSCULAR | Status: DC | PRN

## 2019-06-09 MED ORDER — OXYCODONE HCL 5 MG PO TABS: 5.0000 mg | ORAL_TABLET | Freq: Once | ORAL | Status: DC | PRN

## 2019-06-09 MED ORDER — ACETAMINOPHEN 500 MG PO TABS
1000.0000 mg | ORAL_TABLET | ORAL | Status: AC
  Administered 2019-06-09: 1000 mg via ORAL

## 2019-06-09 MED ORDER — LACTATED RINGERS IV SOLN
INTRAVENOUS | Status: DC
  Administered 2019-06-09: 09:00:00 via INTRAVENOUS

## 2019-06-09 MED ORDER — FENTANYL CITRATE (PF) 100 MCG/2ML IJ SOLN
INTRAMUSCULAR | Status: AC
  Filled 2019-06-09: qty 2

## 2019-06-09 MED ORDER — PROPOFOL 500 MG/50ML IV EMUL
INTRAVENOUS | Status: DC | PRN
  Administered 2019-06-09: 50 ug/kg/min via INTRAVENOUS

## 2019-06-09 MED ORDER — GABAPENTIN 300 MG PO CAPS
ORAL_CAPSULE | ORAL | Status: AC
  Filled 2019-06-09: qty 1

## 2019-06-09 MED ORDER — PROPOFOL 10 MG/ML IV BOLUS
INTRAVENOUS | Status: AC
  Filled 2019-06-09: qty 20

## 2019-06-09 SURGICAL SUPPLY — 38 items
ADH SKN CLS APL DERMABOND .7 (GAUZE/BANDAGES/DRESSINGS) ×1
APL PRP STRL LF DISP 70% ISPRP (MISCELLANEOUS) ×1
APL SKNCLS STERI-STRIP NONHPOA (GAUZE/BANDAGES/DRESSINGS)
BENZOIN TINCTURE PRP APPL 2/3 (GAUZE/BANDAGES/DRESSINGS) IMPLANT
BLADE SURG 15 STRL LF DISP TIS (BLADE) ×1 IMPLANT
BLADE SURG 15 STRL SS (BLADE) ×3
CHLORAPREP W/TINT 26 (MISCELLANEOUS) ×3 IMPLANT
CLOSURE WOUND 1/2 X4 (GAUZE/BANDAGES/DRESSINGS)
COVER BACK TABLE REUSABLE LG (DRAPES) ×3 IMPLANT
COVER MAYO STAND REUSABLE (DRAPES) ×3 IMPLANT
COVER WAND RF STERILE (DRAPES) IMPLANT
DECANTER SPIKE VIAL GLASS SM (MISCELLANEOUS) ×3 IMPLANT
DERMABOND ADVANCED (GAUZE/BANDAGES/DRESSINGS) ×2
DERMABOND ADVANCED .7 DNX12 (GAUZE/BANDAGES/DRESSINGS) ×1 IMPLANT
DRAPE LAPAROTOMY 100X72 PEDS (DRAPES) ×3 IMPLANT
DRAPE UTILITY XL STRL (DRAPES) ×3 IMPLANT
ELECT REM PT RETURN 9FT ADLT (ELECTROSURGICAL) ×3
ELECTRODE REM PT RTRN 9FT ADLT (ELECTROSURGICAL) ×1 IMPLANT
GLOVE BIO SURGEON STRL SZ7 (GLOVE) ×4 IMPLANT
GLOVE BIOGEL PI IND STRL 7.0 (GLOVE) IMPLANT
GLOVE BIOGEL PI IND STRL 8 (GLOVE) ×1 IMPLANT
GLOVE BIOGEL PI INDICATOR 7.0 (GLOVE) ×4
GLOVE BIOGEL PI INDICATOR 8 (GLOVE) ×2
GLOVE ECLIPSE 8.0 STRL XLNG CF (GLOVE) ×3 IMPLANT
GOWN STRL REUS W/ TWL LRG LVL3 (GOWN DISPOSABLE) ×2 IMPLANT
GOWN STRL REUS W/TWL LRG LVL3 (GOWN DISPOSABLE) ×6
NDL HYPO 25X1 1.5 SAFETY (NEEDLE) ×1 IMPLANT
NEEDLE HYPO 25X1 1.5 SAFETY (NEEDLE) ×3 IMPLANT
NS IRRIG 1000ML POUR BTL (IV SOLUTION) ×3 IMPLANT
PACK BASIN DAY SURGERY FS (CUSTOM PROCEDURE TRAY) ×3 IMPLANT
PENCIL BUTTON HOLSTER BLD 10FT (ELECTRODE) ×2 IMPLANT
SLEEVE SCD COMPRESS KNEE MED (MISCELLANEOUS) IMPLANT
SPONGE LAP 4X18 RFD (DISPOSABLE) ×3 IMPLANT
STRIP CLOSURE SKIN 1/2X4 (GAUZE/BANDAGES/DRESSINGS) IMPLANT
SUT MON AB 4-0 PC3 18 (SUTURE) ×3 IMPLANT
SUT VICRYL 3-0 CR8 SH (SUTURE) ×3 IMPLANT
SYR CONTROL 10ML LL (SYRINGE) ×3 IMPLANT
TOWEL GREEN STERILE FF (TOWEL DISPOSABLE) ×3 IMPLANT

## 2019-06-09 NOTE — Anesthesia Preprocedure Evaluation (Signed)
Anesthesia Evaluation  Patient identified by MRN, date of birth, ID band Patient awake    Reviewed: Allergy & Precautions, NPO status , Patient's Chart, lab work & pertinent test results  Airway Mallampati: II  TM Distance: >3 FB Neck ROM: Full    Dental  (+) Dental Advisory Given, Partial Upper   Pulmonary Current Smoker,    Pulmonary exam normal breath sounds clear to auscultation       Cardiovascular hypertension, Pt. on medications + Past MI  Normal cardiovascular exam Rhythm:Regular Rate:Normal     Neuro/Psych PSYCHIATRIC DISORDERS Anxiety negative neurological ROS     GI/Hepatic Neg liver ROS, GERD  Medicated and Poorly Controlled,  Endo/Other  negative endocrine ROS  Renal/GU negative Renal ROS     Musculoskeletal  (+) Arthritis ,   Abdominal   Peds  Hematology negative hematology ROS (+)   Anesthesia Other Findings Day of surgery medications reviewed with the patient.  Breast cancer  Reproductive/Obstetrics                             Anesthesia Physical  Anesthesia Plan  ASA: II  Anesthesia Plan: MAC   Post-op Pain Management:    Induction: Intravenous  PONV Risk Score and Plan: 1 and Ondansetron and Treatment may vary due to age or medical condition  Airway Management Planned: Simple Face Mask  Additional Equipment:   Intra-op Plan:   Post-operative Plan: Extubation in OR  Informed Consent: I have reviewed the patients History and Physical, chart, labs and discussed the procedure including the risks, benefits and alternatives for the proposed anesthesia with the patient or authorized representative who has indicated his/her understanding and acceptance.     Dental advisory given  Plan Discussed with: CRNA  Anesthesia Plan Comments:         Anesthesia Quick Evaluation

## 2019-06-09 NOTE — Anesthesia Postprocedure Evaluation (Signed)
Anesthesia Post Note  Patient: [name]  Procedure(s) Performed: REMOVAL PORT-A-CATH (Right Chest)     Patient location during evaluation: PACU Anesthesia Type: MAC Level of consciousness: awake and alert Pain management: pain level controlled Vital Signs Assessment: post-procedure vital signs reviewed and stable Respiratory status: spontaneous breathing, nonlabored ventilation and respiratory function stable Cardiovascular status: stable and blood pressure returned to baseline Postop Assessment: no apparent nausea or vomiting Anesthetic complications: no    Last Vitals:  Vitals:   06/09/19 0945 06/09/19 1008  BP: 119/60 (!) 132/55  Pulse: 64 65  Resp: 18 16  Temp:  36.5 C  SpO2: 100% 98%    Last Pain:  Vitals:   06/09/19 1008  TempSrc: Oral  PainSc: 0-No pain                 Lynda Rainwater

## 2019-06-09 NOTE — Interval H&P Note (Signed)
History and Physical Interval Note:  06/09/2019 8:38 AM  [name]  has presented today for surgery, with the diagnosis of PORT IN PLACE.  The various methods of treatment have been discussed with the patient and family. After consideration of risks, benefits and other options for treatment, the patient has consented to  Procedure(s): REMOVAL PORT-A-CATH (N/A) as a surgical intervention.  The patient's history has been reviewed, patient examined, no change in status, stable for surgery.  I have reviewed the patient's chart and labs.  Questions were answered to the patient's satisfaction.     [place]

## 2019-06-09 NOTE — Transfer of Care (Signed)
Immediate Anesthesia Transfer of Care Note  Patient: [name]  Procedure(s) Performed: REMOVAL PORT-A-CATH (Right Chest)  Patient Location: PACU  Anesthesia Type:MAC  Level of Consciousness: awake, alert  and oriented  Airway & Oxygen Therapy: Patient Spontanous Breathing and Patient connected to nasal cannula oxygen  Post-op Assessment: Report given to RN and Post -op Vital signs reviewed and stable  Post vital signs: Reviewed and stable  Last Vitals:  Vitals Value Taken Time  BP 116/60 06/09/19 0927  Temp    Pulse 69 06/09/19 0929  Resp 14 06/09/19 0929  SpO2 100 % 06/09/19 0929  Vitals shown include unvalidated device data.  Last Pain:  Vitals:   06/09/19 0838  TempSrc: Oral  PainSc: 0-No pain         Complications: No apparent anesthesia complications

## 2019-06-09 NOTE — Op Note (Signed)

## 2019-06-09 NOTE — Discharge Instructions (Signed)
°Post Anesthesia Home Care Instructions ° °Activity: °Get plenty of rest for the remainder of the day. A responsible individual must stay with you for 24 hours following the procedure.  °For the next 24 hours, DO NOT: °-Drive a car °-Operate machinery °-Drink alcoholic beverages °-Take any medication unless instructed by your physician °-Make any legal decisions or sign important papers. ° °Meals: °Start with liquid foods such as gelatin or soup. Progress to regular foods as tolerated. Avoid greasy, spicy, heavy foods. If nausea and/or vomiting occur, drink only clear liquids until the nausea and/or vomiting subsides. Call your physician if vomiting continues. ° °Special Instructions/Symptoms: °Your throat may feel dry or sore from the anesthesia or the breathing tube placed in your throat during surgery. If this causes discomfort, gargle with warm salt water. The discomfort should disappear within 24 hours. ° °If you had a scopolamine patch placed behind your ear for the management of post- operative nausea and/or vomiting: ° °1. The medication in the patch is effective for 72 hours, after which it should be removed.  Wrap patch in a tissue and discard in the trash. Wash hands thoroughly with soap and water. °2. You may remove the patch earlier than 72 hours if you experience unpleasant side effects which may include dry mouth, dizziness or visual disturbances. °3. Avoid touching the patch. Wash your hands with soap and water after contact with the patch. °   ° ° ° ° °GENERAL SURGERY: POST OP INSTRUCTIONS ° °###################################################################### ° °EAT °Gradually transition to a high fiber diet with a fiber supplement over the next few weeks after discharge.  Start with a pureed / full liquid diet (see below) ° °WALK °Walk an hour a day.  Control your pain to do that.   ° °CONTROL PAIN °Control pain so that you can walk, sleep, tolerate sneezing/coughing, go up/down stairs. ° °HAVE  A BOWEL MOVEMENT DAILY °Keep your bowels regular to avoid problems.  OK to try a laxative to override constipation.  OK to use an antidairrheal to slow down diarrhea.  Call if not better after 2 tries ° °CALL IF YOU HAVE PROBLEMS/CONCERNS °Call if you are still struggling despite following these instructions. °Call if you have concerns not answered by these instructions ° °###################################################################### ° ° ° °1. DIET: Follow a light bland diet & liquids the first 24 hours after arrival home, such as soup, liquids, starches, etc.  Be sure to drink plenty of fluids.  Quickly advance to a usual solid diet within a few days.  Avoid fast food or heavy meals as your are more likely to get nauseated or have irregular bowels.  A low-fat, high-fiber diet for the rest of your life is ideal.   °2. Take your usually prescribed home medications unless otherwise directed. °3. PAIN CONTROL: °a. Pain is best controlled by a usual combination of three different methods TOGETHER: °i. Ice/Heat °ii. Over the counter pain medication °iii. Prescription pain medication °b. Most patients will experience some swelling and bruising around the incisions.  Ice packs or heating pads (30-60 minutes up to 6 times a day) will help. Use ice for the first few days to help decrease swelling and bruising, then switch to heat to help relax tight/sore spots and speed recovery.  Some people prefer to use ice alone, heat alone, alternating between ice & heat.  Experiment to what works for you.  Swelling and bruising can take several weeks to resolve.   °c. It is helpful to take an   over-the-counter pain medication regularly for the first few weeks.  Choose one of the following that works best for you: °i. Naproxen (Aleve, etc)  Two 220mg tabs twice a day °ii. Ibuprofen (Advil, etc) Three 200mg tabs four times a day (every meal & bedtime) °iii. Acetaminophen (Tylenol, etc) 500-650mg four times a day (every meal &  bedtime) °d. A  prescription for pain medication (such as oxycodone, hydrocodone, etc) should be given to you upon discharge.  Take your pain medication as prescribed.  °i. If you are having problems/concerns with the prescription medicine (does not control pain, nausea, vomiting, rash, itching, etc), please call us [phone] to see if we need to switch you to a different pain medicine that will work better for you and/or control your side effect better. °ii. If you need a refill on your pain medication, please contact your pharmacy.  They will contact our office to request authorization. Prescriptions will not be filled after 5 pm or on week-ends. °4. Avoid getting constipated.  Between the surgery and the pain medications, it is common to experience some constipation.  Increasing fluid intake and taking a fiber supplement (such as Metamucil, Citrucel, FiberCon, MiraLax, etc) 1-2 times a day regularly will usually help prevent this problem from occurring.  A mild laxative (prune juice, Milk of Magnesia, MiraLax, etc) should be taken according to package directions if there are no bowel movements after 48 hours.   °5. Wash / shower every day.  You may shower over the dressings as they are waterproof.  Continue to shower over incision(s) after the dressing is off. °6. Remove your waterproof bandages 5 days after surgery.  You may leave the incision open to air.  You may have skin tapes (Steri Strips) covering the incision(s).  Leave them on until one week, then remove.  You may replace a dressing/Band-Aid to cover the incision for comfort if you wish.  ° ° ° ° °7. ACTIVITIES as tolerated:   °a. You may resume regular (light) daily activities beginning the next day--such as daily self-care, walking, climbing stairs--gradually increasing activities as tolerated.  If you can walk 30 minutes without difficulty, it is safe to try more intense activity such as jogging, treadmill, bicycling, low-impact aerobics,  swimming, etc. °b. Save the most intensive and strenuous activity for last such as sit-ups, heavy lifting, contact sports, etc  Refrain from any heavy lifting or straining until you are off narcotics for pain control.   °c. DO NOT PUSH THROUGH PAIN.  Let pain be your guide: If it hurts to do something, don't do it.  Pain is your body warning you to avoid that activity for another week until the pain goes down. °d. You may drive when you are no longer taking prescription pain medication, you can comfortably wear a seatbelt, and you can safely maneuver your car and apply brakes. °e. You may have sexual intercourse when it is comfortable.  °8. FOLLOW UP in our office °a. Please call [facility] at [phone] to set up an appointment to see your surgeon in the office for a follow-up appointment approximately 2-3 weeks after your surgery. °b. Make sure that you call for this appointment the day you arrive home to insure a convenient appointment time. °9. IF YOU HAVE DISABILITY OR FAMILY LEAVE FORMS, BRING THEM TO THE OFFICE FOR PROCESSING.  DO NOT GIVE THEM TO YOUR DOCTOR. ° ° °WHEN TO CALL US [phone]: °1. Poor pain control °2. Reactions / problems with   new medications (rash/itching, nausea, etc)  °3. Fever over 101.5 F (38.5 C) °4. Worsening swelling or bruising °5. Continued bleeding from incision. °6. Increased pain, redness, or drainage from the incision °7. Difficulty breathing / swallowing ° ° The clinic staff is available to answer your questions during regular business hours (8:30am-5pm).  Please don’t hesitate to call and ask to speak to one of our nurses for clinical concerns.  ° If you have a medical emergency, go to the nearest emergency room or call 911. ° A surgeon from [facility] is always on call at the hospitals ° ° °[facility] °[address], [address], [place], [place]  [address] ? °MAIN: [phone] ? TOLL FREE: [phone] ?  °FAX [phone] °[email or web address] ° °

## 2019-06-10 ENCOUNTER — Encounter (HOSPITAL_BASED_OUTPATIENT_CLINIC_OR_DEPARTMENT_OTHER): Payer: Self-pay | Admitting: Surgery

## 2019-06-15 ENCOUNTER — Other Ambulatory Visit: Payer: Self-pay

## 2019-06-15 ENCOUNTER — Encounter (HOSPITAL_COMMUNITY): Payer: Self-pay

## 2019-06-15 ENCOUNTER — Ambulatory Visit (HOSPITAL_COMMUNITY)
Admission: RE | Admit: 2019-06-15 | Discharge: 2019-06-15 | Disposition: A | Discharge status: Home / Self Care | Payer: Medicare HMO | Source: Ambulatory Visit | Attending: Adult Health | Admitting: Adult Health

## 2019-06-15 DIAGNOSIS — F17210 Nicotine dependence, cigarettes, uncomplicated: Secondary | ICD-10-CM | POA: Diagnosis present

## 2019-06-15 DIAGNOSIS — F17200 Nicotine dependence, unspecified, uncomplicated: Secondary | ICD-10-CM

## 2019-06-15 DIAGNOSIS — J439 Emphysema, unspecified: Secondary | ICD-10-CM | POA: Insufficient documentation

## 2019-06-15 DIAGNOSIS — I700 Atherosclerosis of aorta: Secondary | ICD-10-CM | POA: Insufficient documentation

## 2019-06-16 ENCOUNTER — Telehealth: Payer: Self-pay

## 2019-06-16 NOTE — Telephone Encounter (Signed)
-----   Message from Gardenia Phlegm, NP sent at 06/16/2019  7:40 AM EDT ----- Please call patient.  No cancer noted on scan.  She needs repeat ct in 12 months. ----- Message ----- From: Interface, Rad Results In Sent: 06/15/2019   3:55 PM EDT To: Gardenia Phlegm, NP

## 2019-06-16 NOTE — Telephone Encounter (Signed)
Patient returned call from nurse.  Patient was informed that no cancer was noted on scan.  Per NP, patient should repeat scan in 12 months.  Patient voiced understanding and thanks for call.

## 2019-06-17 ENCOUNTER — Other Ambulatory Visit: Payer: Self-pay

## 2019-06-17 ENCOUNTER — Ambulatory Visit
Admission: RE | Admit: 2019-06-17 | Discharge: 2019-06-17 | Disposition: A | Discharge status: Home / Self Care | Payer: Medicare HMO | Source: Ambulatory Visit | Attending: Adult Health | Admitting: Adult Health

## 2019-06-17 DIAGNOSIS — Z170 Estrogen receptor positive status [ER+]: Secondary | ICD-10-CM

## 2019-06-17 DIAGNOSIS — C50212 Malignant neoplasm of upper-inner quadrant of left female breast: Secondary | ICD-10-CM

## 2019-07-20 MED FILL — LORazepam 0.5 MG TABS: 0.5 | 30 days supply | Qty: 30 | Fill #1

## 2019-07-20 MED FILL — LETROZOLE 2.5 MG TABLET: 2.5 | 90 days supply | Qty: 90 | Fill #2

## 2019-08-15 ENCOUNTER — Telehealth: Payer: Self-pay

## 2019-08-15 ENCOUNTER — Ambulatory Visit
Admission: RE | Admit: 2019-08-15 | Discharge: 2019-08-15 | Disposition: A | Discharge status: Home / Self Care | Payer: Medicare HMO | Source: Ambulatory Visit | Attending: Adult Health | Admitting: Adult Health

## 2019-08-15 ENCOUNTER — Other Ambulatory Visit: Payer: Self-pay

## 2019-08-15 DIAGNOSIS — E2839 Other primary ovarian failure: Secondary | ICD-10-CM

## 2019-08-15 NOTE — Telephone Encounter (Signed)
Spoke with patient to inform of BD results showing osteopenia.  NP recommendations to add calcium and vitamin D daily and to do weight bearing exercises.  Patient voiced understanding and thanks for call.

## 2019-08-15 NOTE — Telephone Encounter (Signed)
-----   Message from Gardenia Phlegm, NP sent at 08/15/2019 12:01 PM EDT ----- Please call patient about bone density.  It is consistent with osteopenia.  Recommend calcium, vitamin d, and weight bearing exercises.  She can discuss further with Gudena in 11/2019 at her f/u.  ----- Message ----- From: Interface, Rad Results In Sent: 08/15/2019   9:28 AM EDT To: Gardenia Phlegm, NP

## 2019-10-07 MED FILL — LORazepam 0.5 MG TABS: 0.5 | 30 days supply | Qty: 30 | Fill #2

## 2019-10-19 ENCOUNTER — Ambulatory Visit: Payer: Self-pay | Admitting: Podiatry

## 2019-10-21 MED FILL — LETROZOLE 2.5 MG TABLET: 2.5 | 90 days supply | Qty: 90 | Fill #3

## 2019-11-09 ENCOUNTER — Other Ambulatory Visit: Payer: Self-pay | Admitting: Podiatry

## 2019-11-09 ENCOUNTER — Other Ambulatory Visit: Payer: Self-pay

## 2019-11-09 ENCOUNTER — Ambulatory Visit: Payer: Medicare HMO | Admitting: Podiatry

## 2019-11-09 ENCOUNTER — Encounter: Payer: Self-pay | Admitting: Podiatry

## 2019-11-09 ENCOUNTER — Ambulatory Visit (INDEPENDENT_AMBULATORY_CARE_PROVIDER_SITE_OTHER): Payer: Medicare HMO

## 2019-11-09 DIAGNOSIS — M722 Plantar fascial fibromatosis: Secondary | ICD-10-CM

## 2019-11-09 DIAGNOSIS — M79671 Pain in right foot: Secondary | ICD-10-CM

## 2019-11-09 DIAGNOSIS — M79672 Pain in left foot: Secondary | ICD-10-CM

## 2019-11-09 NOTE — Patient Instructions (Signed)

## 2019-11-09 NOTE — Progress Notes (Signed)
Subjective:   Patient ID: [name], female   DOB: 73 y.o.   MRN: [id number]   HPI Patient states she has had generalized pain in her feet and states that she does smoke a half a pack per day and states that her feet just seem to bother her and she has had chemo and she is had trouble since then   Review of Systems  All other systems reviewed and are negative.       Objective:  Physical Exam Vitals signs and nursing note reviewed.  Constitutional:      Appearance: She is well-developed.  Pulmonary:     Effort: Pulmonary effort is normal.  Musculoskeletal: Normal range of motion.  Skin:    General: Skin is warm.  Neurological:     Mental Status: She is alert.     Neurovascular status intact muscle strength was found to be adequate range of motion moderately reduced with diminishment of sharp dull bilateral.  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  Patient had fascial braces dispensed bilateral and will be seen back to recheck again in 2 weeks  X-rays indicate there is small spur formation bilateral heel with no indications of stress fracture arthritis

## 2019-11-23 ENCOUNTER — Ambulatory Visit: Payer: Medicare HMO | Admitting: Podiatry

## 2019-11-28 ENCOUNTER — Telehealth: Payer: Self-pay

## 2019-11-28 NOTE — Telephone Encounter (Signed)
Pt called to cancel MD appointment for 12/29, pt declined offer to reschedule.  Will contact office at later time to reschedule.

## 2019-11-29 ENCOUNTER — Inpatient Hospital Stay: Payer: Medicare HMO | Admitting: Hematology and Oncology

## 2019-12-05 ENCOUNTER — Ambulatory Visit: Payer: Medicare HMO | Admitting: Podiatry

## 2019-12-19 ENCOUNTER — Telehealth: Payer: Self-pay | Admitting: *Deleted

## 2019-12-19 NOTE — Telephone Encounter (Signed)
Received call from pt stating she has been experiencing left breast pain x3 days.  Pt denies any fever, redness, swelling or warmth to the touch.  Pt also denies any recent injury, trauma, or lifting/pushing heavy objects.  Pt requesting to be seen in the office for MD advice and breast exam prior to having any tests or imaging ordered.  Apt scheduled and pt verbalized understanding of date and time.

## 2019-12-21 NOTE — Progress Notes (Signed)
Patient Care Team: Michelle Ebbs, MD as PCP - General (Internal Medicine) Michelle Gibson, MD as Attending Physician (Radiation Oncology) Michelle Lose, MD as Consulting Physician (Hematology and Oncology) Michelle Luna, MD as Consulting Physician (General Surgery)  DIAGNOSIS:    ICD-10-CM   1. Malignant neoplasm of upper-inner quadrant of left breast in female, estrogen receptor positive ([place])  C50.212    Z17.0     SUMMARY OF ONCOLOGIC HISTORY: Oncology History  Malignant neoplasm of upper-inner quadrant of left breast in female, estrogen receptor positive ([place])  02/03/2018 Initial Diagnosis   Left breast palpable lump at 1130 position 2.3 cm, at 11 o'clock position 6 mm which was benign; biopsy revealed IDC grade 3 ER 30%, PR 0%, Ki-67 30%, HER-2 positive ratio 5.62, copy #16.3, axillary lymph node biopsy benign concordant, T2 N0 stage II a clinical stage AJCC 8   02/11/2018 Genetic Testing   Negative. Two Variants of Uncertain Significance were detected: ALK c.1190A>T (p.Asp397Val) and PMS2 c.1058C>T (p.Ala353Val).  Genes tested include: ALK, APC, ATM, AXIN2, BAP1, BARD1, BLM, BMPR1A, BRCA1, BRCA2, BRIP1, CASR, CDC73, CDH1, CDK4, CDKN1B, CDKN1C, CDKN2A, CEBPA, CHEK2, CTNNA1, DICER1, DIS3L2, EGFR, EPCAM, FH, FLCN, GATA2, GPC3, GREM1, HOXB13, HRAS, KIT, MAX, MEN1, MET, MITF, MLH1, MSH2, MSH3, MSH6, MUTYH, NBN, NF1, NF2, NTHL1, PALB2, PDGFRA, PHOX2B, PMS2, POLD1, POLE, POT1, PRKAR1A, PTCH1, PTEN, RAD50, RAD51C, RAD51D, RB1, RECQL4, RET, RUNX1, SDHA, SDHAF2, SDHB, SDHC, SDHD, SMAD4, SMARCA4, SMARCB1, SMARCE1, STK11, SUFU, TERC, TERT, TMEM127, TP53, TSC1, TSC2, VHL, WRN, WT1.   02/18/2018 Breast MRI   Solitary mass in left breast upper outer quadrant 2.5 cm.  No additional enhancement in the right breast.  No adenopathy   02/26/2018 - 04/15/2018 Neo-Adjuvant Chemotherapy   TCH Perjeta X 3 cycles (stopped early due to toxicities) followed by Herceptin Perjeta maintenance   06/18/2018 Breast MRI    Marked reduction in the abnormal enhancement in the upper inner left breast at the site of known cancer.  There is approximately 2.5 cm of residual linear non mass enhancement at the site of known cancer.   08/12/2018 Surgery   Left lumpectomy: No residual cancer identified 0/4 lymph nodes negative, complete pathologic response   09/14/2018 - 10/04/2018 Radiation Therapy   Adjuvant radiation therapy   Left Breast / 42.56 Gy in 16 fractions   11/11/2018 -  Anti-estrogen oral therapy   Letrozole 2.72m daily     CHIEF COMPLIANT: Follow-up of left breast cancer on letrozole therapy, left breast pain  INTERVAL HISTORY: Michelle KNIGHTONis a 74y.o. with above-mentioned history of HER-2 positive left breast cancer treated with neoadjuvant chemotherapy, lumpectomy, radiation, Herceptin Perjeta maintenance, and who is currently on antiestrogen therapy with letrozole. Mammogram on 06/17/19 showed no evidence of malignancy bilaterally. Bone density scan on 08/15/19 showed osteopenia with a T-score of -2.2 at the femur neck right. She presents to the clinic today for follow-up of recent left breast pain.  The pain is actually in her left axilla along the surgical scar of the lymph node removal area.  She has had it twice so far and this time it is lasting a lot longer.  Is been going on for the past 4 days.  ALLERGIES:  is allergic to codeine and demerol [meperidine].  MEDICATIONS:  Current Outpatient Medications  Medication Sig Dispense Refill  . amLODipine (NORVASC) 10 MG tablet Take 10 mg by mouth daily.     . furosemide (LASIX) 20 MG tablet Take 20 mg by mouth daily as needed for edema.    .Marland Kitchen  gabapentin (NEURONTIN) 300 MG capsule Take 300 mg by mouth 3 (three) times daily as needed.    . hydrochlorothiazide (HYDRODIURIL) 25 MG tablet Take 1 tablet (25 mg total) by mouth daily. 90 tablet 3  . hydrOXYzine (ATARAX/VISTARIL) 50 MG tablet Take 50 mg by mouth 3 (three) times daily as needed.    Marland Kitchen  letrozole (FEMARA) 2.5 MG tablet Take 1 tablet (2.5 mg total) by mouth daily. 90 tablet 3  . lidocaine-prilocaine (EMLA) cream Apply to affected area once 30 g 3  . LORazepam (ATIVAN) 0.5 MG tablet TAKE 1 TABLET (0.5 MG TOTAL) BY MOUTH AT BEDTIME AS NEEDED FOR ANXIETY. 30 tablet 3  . meloxicam (MOBIC) 7.5 MG tablet     . omeprazole (PRILOSEC) 20 MG capsule Take 20 mg by mouth daily.    . ondansetron (ZOFRAN) 8 MG tablet Take 1 tablet (8 mg total) by mouth 2 (two) times daily as needed for refractory nausea / vomiting. 30 tablet 3  . pantoprazole (PROTONIX) 40 MG tablet     . pramipexole (MIRAPEX) 0.25 MG tablet Take 0.25-1 mg by mouth at bedtime.     . prochlorperazine (COMPAZINE) 10 MG tablet Take 1 tablet (10 mg total) by mouth every 6 (six) hours as needed (Nausea or vomiting). (Patient not taking: Reported on 11/09/2019) 30 tablet 1  . traMADol (ULTRAM) 50 MG tablet Take 50 mg by mouth every 6 (six) hours as needed. for pain    . triamcinolone cream (KENALOG) 0.1 % Apply 1 application topically 3 (three) times daily. 45 g 1   No current facility-administered medications for this visit.    PHYSICAL EXAMINATION: ECOG PERFORMANCE STATUS: 1 - Symptomatic but completely ambulatory  Vitals:   12/22/19 0831  BP: (!) 160/85  Pulse: 60  Resp: 18  Temp: 97.8 F (36.6 C)  SpO2: 100%   Filed Weights   12/22/19 0831  Weight: 148 lb 12.8 oz (67.5 kg)    BREAST: No palpable masses or nodules in either right or left breasts. No palpable axillary supraclavicular or infraclavicular adenopathy no breast tenderness or nipple discharge. (exam performed in the presence of a chaperone)  LABORATORY DATA:  I have reviewed the data as listed CMP Latest Ref Rng & Units 06/06/2019 02/24/2019 02/03/2019  Glucose 70 - 99 mg/dL 101(H) 106(H) 84  BUN 8 - 23 mg/dL _0 Creatinine 0.44 - 1.00 mg/dL 1.14(H) 1.13(H) 0.80  Sodium 135 - 145 mmol/L 138 137 139  Potassium 3.5 - 5.1 mmol/L 4.6 4.1 4.0  Chloride  98 - 111 mmol/L 105 105 110  CO2 22 - 32 mmol/L 22 22 21(L)  Calcium 8.9 - 10.3 mg/dL 9.3 9.3 8.9  Total Protein 6.5 - 8.1 g/dL 7.8 - 6.9  Total Bilirubin 0.3 - 1.2 mg/dL 0.5 - 0.5  Alkaline Phos 38 - 126 U/L 79 - 70  AST 15 - 41 U/L 19 - 17  ALT 0 - 44 U/L 12 - 12    Lab Results  Component Value Date   WBC 10.3 06/06/2019   HGB 11.0 (L) 06/06/2019   HCT 33.0 (L) 06/06/2019   MCV 87.8 06/06/2019   PLT 408 (H) 06/06/2019   NEUTROABS 6.9 06/06/2019    ASSESSMENT & PLAN:  Malignant neoplasm of upper-inner quadrant of left breast in female, estrogen receptor positive ([facility]) 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%, PR 0%, Ki-67  30%, HER-2 positive ratio 5.62, copy #16.3, axillary lymph node biopsy benign concordant, T2 N0 stage II a clinical stage AJCC 8  Treatment plan: 1.Neoadjuvant chemotherapy with TCH Perjeta x 3cycles (discontinued early due to toxicities of severe diarrhea nausea and vomiting)02/26/2018 to 5/16/2019followed by Herceptin Perjeta maintenance for 1 year completed 02/24/2019 2.followed by breast conserving surgery9/10/2018: Pathologic complete response 16fllowed by adjuvant radiation10/15/2019-10/04/2018 4.Followed by adjuvant antiestrogen therapy with letrozole for 5-7 years started 11/11/2018 ------------------------------------------------------------------------------------------------------------------------ Current treatment: Letrozole 2.5 mg daily started 11/11/2018 Letrozole toxicities: 1.  Nausea: Improved 2.  Occasional hot flashes  Breast discomfort: Tenderness along the left axillary surgical scars.  Physical examination does not reveal any lumps or nodules.  We will obtain an ultrasound for further evaluation. For the pain we prescribed Mobic. History of chemotherapy-induced anemia: Insomnia: She ran out of lorazepam previously.  I sent a  prescription for Ambien today. I believe most of her symptoms are stress related.    Return to clinic based upon the mammogram and ultrasound results.    No orders of the defined types were placed in this encounter.  The patient has a good understanding of the overall plan. she agrees with it. she will call with any problems that may develop before the next visit here.  Total time spent: 15 mins including face to face time and time spent for planning, charting and coordination of care  GNicholas Lose MD 12/22/2019  I, MCloyde ReamsDorshimer, am acting as scribe for Dr. VNicholas Bradshaw  I have reviewed the above documentation for accuracy and completeness, and I agree with the above.

## 2019-12-22 ENCOUNTER — Inpatient Hospital Stay: Payer: Medicare HMO | Attending: Hematology and Oncology | Admitting: Hematology and Oncology

## 2019-12-22 ENCOUNTER — Other Ambulatory Visit: Payer: Self-pay

## 2019-12-22 DIAGNOSIS — Z170 Estrogen receptor positive status [ER+]: Secondary | ICD-10-CM | POA: Diagnosis not present

## 2019-12-22 DIAGNOSIS — Z79811 Long term (current) use of aromatase inhibitors: Secondary | ICD-10-CM | POA: Insufficient documentation

## 2019-12-22 DIAGNOSIS — Z79899 Other long term (current) drug therapy: Secondary | ICD-10-CM | POA: Diagnosis not present

## 2019-12-22 DIAGNOSIS — R232 Flushing: Secondary | ICD-10-CM | POA: Insufficient documentation

## 2019-12-22 DIAGNOSIS — C50212 Malignant neoplasm of upper-inner quadrant of left female breast: Secondary | ICD-10-CM

## 2019-12-22 DIAGNOSIS — R110 Nausea: Secondary | ICD-10-CM | POA: Insufficient documentation

## 2019-12-22 MED ORDER — MELOXICAM 7.5 MG PO TABS: 7.5000 mg | ORAL_TABLET | Freq: Two times a day (BID) | ORAL | 2 refills | Status: DC | PRN

## 2019-12-22 MED ORDER — ZOLPIDEM TARTRATE 5 MG PO TABS: 5.0000 mg | ORAL_TABLET | Freq: Every evening | ORAL | 3 refills | Status: DC | PRN

## 2019-12-22 NOTE — Assessment & Plan Note (Signed)
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, copy #16.3, axillary lymph node biopsy benign concordant, T2 N0 stage II a clinical stage AJCC 8  Treatment plan: 1.Neoadjuvant chemotherapy with TCH Perjeta x 3cycles (discontinued early due to toxicities of severe diarrhea nausea and vomiting)02/26/2018 to 5/16/2019followed by Herceptin Perjeta maintenance for 1 year completed 02/24/2019 2.followed by breast conserving surgery9/10/2018: Pathologic complete response 6fllowed by adjuvant radiation10/15/2019-10/04/2018 4.Followed by adjuvant antiestrogen therapy with letrozole for 5-7 years started 11/11/2018 ------------------------------------------------------------------------------------------------------------------------ Current treatment: Letrozole 2.5 mg daily started 11/11/2018 Letrozole toxicities: 1.  Nausea: Improved 2.  Occasional hot flashes  Breast discomfort: Physical examination does not reveal any lumps or nodules.  We will obtain a mammogram and ultrasound for further evaluation.  History of chemotherapy-induced anemia: Insomnia: On lorazepam.  Return to clinic based upon the mammogram and ultrasound results.

## 2020-01-13 ENCOUNTER — Other Ambulatory Visit: Payer: Medicare HMO

## 2020-01-20 ENCOUNTER — Telehealth: Payer: Self-pay | Admitting: General Practice

## 2020-01-20 ENCOUNTER — Telehealth: Payer: Self-pay | Admitting: Licensed Clinical Social Worker

## 2020-01-20 NOTE — Telephone Encounter (Signed)
LVM for patient after receiving voicemail stating that patient had a question.

## 2020-01-20 NOTE — Telephone Encounter (Signed)
[facility] CSW Progress Notes  Call from patient - she wants help accessing medications.  CSW Stoisits asked to respond as she covers Dr Landis Gandy patients.    Edwyna Shell, LCSW Clinical Social Worker Phone:  [phone]

## 2020-01-25 ENCOUNTER — Other Ambulatory Visit: Payer: Self-pay

## 2020-01-25 ENCOUNTER — Ambulatory Visit: Payer: Medicare HMO

## 2020-01-25 ENCOUNTER — Ambulatory Visit
Admission: RE | Admit: 2020-01-25 | Discharge: 2020-01-25 | Disposition: A | Discharge status: Home / Self Care | Payer: Medicare HMO | Source: Ambulatory Visit | Attending: Hematology and Oncology | Admitting: Hematology and Oncology

## 2020-01-25 ENCOUNTER — Other Ambulatory Visit: Payer: Self-pay | Admitting: Hematology and Oncology

## 2020-01-25 DIAGNOSIS — Z170 Estrogen receptor positive status [ER+]: Secondary | ICD-10-CM

## 2020-01-25 DIAGNOSIS — C50212 Malignant neoplasm of upper-inner quadrant of left female breast: Secondary | ICD-10-CM

## 2020-01-25 DIAGNOSIS — R921 Mammographic calcification found on diagnostic imaging of breast: Secondary | ICD-10-CM

## 2020-01-30 ENCOUNTER — Other Ambulatory Visit: Payer: Self-pay | Admitting: Hematology and Oncology

## 2020-01-30 DIAGNOSIS — C50212 Malignant neoplasm of upper-inner quadrant of left female breast: Secondary | ICD-10-CM

## 2020-02-01 MED FILL — LETROZOLE 2.5 MG TABLET: 2.5 | 90 days supply | Qty: 90 | Fill #0

## 2020-04-12 ENCOUNTER — Other Ambulatory Visit: Payer: Self-pay | Admitting: *Deleted

## 2020-04-12 DIAGNOSIS — Z170 Estrogen receptor positive status [ER+]: Secondary | ICD-10-CM

## 2020-04-12 MED ORDER — LETROZOLE 2.5 MG PO TABS: 2.5000 mg | ORAL_TABLET | Freq: Every day | ORAL | 3 refills | Status: DC

## 2020-04-23 ENCOUNTER — Other Ambulatory Visit: Payer: Self-pay | Admitting: Hematology and Oncology

## 2020-05-21 ENCOUNTER — Other Ambulatory Visit: Payer: Self-pay | Admitting: Hematology and Oncology

## 2020-06-01 ENCOUNTER — Other Ambulatory Visit: Payer: Self-pay | Admitting: *Deleted

## 2020-06-01 DIAGNOSIS — Z170 Estrogen receptor positive status [ER+]: Secondary | ICD-10-CM

## 2020-06-01 MED ORDER — MELOXICAM 7.5 MG PO TABS: 7.5000 mg | ORAL_TABLET | Freq: Two times a day (BID) | ORAL | 0 refills | Status: DC | PRN

## 2020-06-01 MED ORDER — LETROZOLE 2.5 MG PO TABS: 2.5000 mg | ORAL_TABLET | Freq: Every day | ORAL | 3 refills | Status: DC

## 2020-06-18 ENCOUNTER — Ambulatory Visit
Admission: RE | Admit: 2020-06-18 | Discharge: 2020-06-18 | Disposition: A | Discharge status: Home / Self Care | Payer: Medicare HMO | Source: Ambulatory Visit | Attending: Hematology and Oncology | Admitting: Hematology and Oncology

## 2020-06-18 ENCOUNTER — Other Ambulatory Visit: Payer: Self-pay

## 2020-06-18 DIAGNOSIS — C50212 Malignant neoplasm of upper-inner quadrant of left female breast: Secondary | ICD-10-CM

## 2020-06-18 DIAGNOSIS — R921 Mammographic calcification found on diagnostic imaging of breast: Secondary | ICD-10-CM

## 2020-08-01 ENCOUNTER — Other Ambulatory Visit: Payer: Self-pay | Admitting: Oncology

## 2020-09-03 ENCOUNTER — Telehealth: Payer: Self-pay

## 2020-09-03 NOTE — Telephone Encounter (Signed)
Please refer to a new dermatologist with creams [facility]

## 2020-09-03 NOTE — Telephone Encounter (Signed)
Pt called stating she is losing pigmentation in her skin, and would like to know from Dr Lindi Adie what dermatologist he recommends, and requests a referral to that dermatologist. Dr Lindi Adie, please advise.

## 2020-09-04 NOTE — Telephone Encounter (Signed)
Meant to include Gudena's POD on this yesterday.

## 2020-09-05 ENCOUNTER — Other Ambulatory Visit: Payer: Self-pay | Admitting: *Deleted

## 2020-09-05 ENCOUNTER — Telehealth: Payer: Self-pay | Admitting: *Deleted

## 2020-09-05 DIAGNOSIS — L231 Allergic contact dermatitis due to adhesives: Secondary | ICD-10-CM

## 2020-09-05 DIAGNOSIS — C50212 Malignant neoplasm of upper-inner quadrant of left female breast: Secondary | ICD-10-CM

## 2020-09-05 NOTE — Progress Notes (Signed)
Referral faxed to [facility] [phone]. Confirmation received. Pt also stated on vm that there were concerns about side effects that she was having. Returned pt call. Vm left. Awaiting call back from pt.

## 2020-09-05 NOTE — Telephone Encounter (Signed)
Pt called with c/o itching and dryness at vaginal area, which her PCP prescribed gel. Also having no energy and joint pain. Per Dr.Gudena, OK to stop Letrozole and f/u in 2 weeks. Called pt to advise of recommendations and scheduling will f/u with scheduling. Pt verbalized understanding

## 2020-09-07 ENCOUNTER — Telehealth: Payer: Self-pay | Admitting: Hematology and Oncology

## 2020-09-07 NOTE — Telephone Encounter (Signed)
Scheduled per 10/6 staff msg. Called and spoke with pt, confirmed 10/19 appt

## 2020-09-13 NOTE — Progress Notes (Signed)
Pt's demographic and insurance info faxed to [place] at [facility] [phone]. Fax Confirmation received.

## 2020-09-17 NOTE — Progress Notes (Signed)
Patient Care Team: [name], MD as PCP - General (Internal Medicine) Eppie Gibson, MD as Attending Physician (Radiation Oncology) Nicholas Lose, MD as Consulting Physician (Hematology and Oncology) Erroll Luna, MD as Consulting Physician (General Surgery)  DIAGNOSIS:    ICD-10-CM   1. Malignant neoplasm of upper-inner quadrant of left breast in female, estrogen receptor positive ([place])  C50.212    Z17.0     SUMMARY OF ONCOLOGIC HISTORY: Oncology History  Malignant neoplasm of upper-inner quadrant of left breast in female, estrogen receptor positive ([place])  02/03/2018 Initial Diagnosis   Left breast palpable lump at 1130 position 2.3 cm, at 11 o'clock position 6 mm which was benign; biopsy revealed IDC grade 3 ER 30%, PR 0%, Ki-67 30%, HER-2 positive ratio 5.62, copy #16.3, axillary lymph node biopsy benign concordant, T2 N0 stage II a clinical stage AJCC 8   02/11/2018 Genetic Testing   Negative. Two Variants of Uncertain Significance were detected: ALK c.1190A>T (p.Asp397Val) and PMS2 c.1058C>T (p.Ala353Val).  Genes tested include: ALK, APC, ATM, AXIN2, BAP1, BARD1, BLM, BMPR1A, BRCA1, BRCA2, BRIP1, CASR, CDC73, CDH1, CDK4, CDKN1B, CDKN1C, CDKN2A, CEBPA, CHEK2, CTNNA1, DICER1, DIS3L2, EGFR, EPCAM, FH, FLCN, GATA2, GPC3, GREM1, HOXB13, HRAS, KIT, MAX, MEN1, MET, MITF, MLH1, MSH2, MSH3, MSH6, MUTYH, NBN, NF1, NF2, NTHL1, PALB2, PDGFRA, PHOX2B, PMS2, POLD1, POLE, POT1, PRKAR1A, PTCH1, PTEN, RAD50, RAD51C, RAD51D, RB1, RECQL4, RET, RUNX1, SDHA, SDHAF2, SDHB, SDHC, SDHD, SMAD4, SMARCA4, SMARCB1, SMARCE1, STK11, SUFU, TERC, TERT, TMEM127, TP53, TSC1, TSC2, VHL, WRN, WT1.   02/18/2018 Breast MRI   Solitary mass in left breast upper outer quadrant 2.5 cm.  No additional enhancement in the right breast.  No adenopathy   02/26/2018 - 04/15/2018 Neo-Adjuvant Chemotherapy   TCH Perjeta X 3 cycles (stopped early due to toxicities) followed by Herceptin Perjeta maintenance   06/18/2018 Breast MRI    Marked reduction in the abnormal enhancement in the upper inner left breast at the site of known cancer.  There is approximately 2.5 cm of residual linear non mass enhancement at the site of known cancer.   08/12/2018 Surgery   Left lumpectomy: No residual cancer identified 0/4 lymph nodes negative, complete pathologic response   09/14/2018 - 10/04/2018 Radiation Therapy   Adjuvant radiation therapy   Left Breast / 42.56 Gy in 16 fractions   11/11/2018 -  Anti-estrogen oral therapy   Letrozole 2.28m daily     CHIEF COMPLIANT: Follow-up of left breast cancer on letrozole therapy  INTERVAL HISTORY: Michelle LIZis a 74y.o. with above-mentioned history of HER-2 positive left breast cancer treated with neoadjuvant chemotherapy, lumpectomy, radiation, Herceptin Perjeta maintenance, and who is currently on antiestrogen therapy with letrozole. Mammogram on 06/18/20 showed no evidence of malignancy bilaterally. She presents to the clinic today for follow-up.  She stopped letrozole therapy because of multiple side effects.  Some of the symptoms did improve but others did not.  She had hot flashes muscle aches and pains depression weight gain insomnia and overall feeling poorly.  Some of the symptoms did improve after stopping letrozole.  Other stigmata  ALLERGIES:  is allergic to codeine and demerol [meperidine].  MEDICATIONS:  Current Outpatient Medications  Medication Sig Dispense Refill  . amLODipine (NORVASC) 10 MG tablet Take 10 mg by mouth daily.     . furosemide (LASIX) 20 MG tablet Take 20 mg by mouth daily as needed for edema.    . gabapentin (NEURONTIN) 300 MG capsule Take 300 mg by mouth 3 (three) times daily as needed.    .Marland Kitchen  hydrochlorothiazide (HYDRODIURIL) 25 MG tablet Take 1 tablet (25 mg total) by mouth daily. 90 tablet 3  . hydrOXYzine (ATARAX/VISTARIL) 50 MG tablet Take 50 mg by mouth 3 (three) times daily as needed.    Marland Kitchen letrozole (FEMARA) 2.5 MG tablet Take 1 tablet (2.5 mg  total) by mouth daily. 90 tablet 3  . lidocaine-prilocaine (EMLA) cream Apply to affected area once 30 g 3  . meloxicam (MOBIC) 7.5 MG tablet TAKE 1 TABLET TWICE DAILY AS NEEDED FOR PAIN 180 tablet 0  . omeprazole (PRILOSEC) 20 MG capsule Take 20 mg by mouth daily.    . pantoprazole (PROTONIX) 40 MG tablet     . pramipexole (MIRAPEX) 0.25 MG tablet Take 0.25-1 mg by mouth at bedtime.     Marland Kitchen zolpidem (AMBIEN) 5 MG tablet TAKE 1 TABLET BY MOUTH AT BEDTIME AS NEEDED FOR SLEEP 30 tablet 0   No current facility-administered medications for this visit.    PHYSICAL EXAMINATION: ECOG PERFORMANCE STATUS: 1 - Symptomatic but completely ambulatory  Vitals:   09/18/20 1049  BP: (!) 170/92  Pulse: 71  Resp: 18  Temp: 98.2 F (36.8 C)  SpO2: 96%   Filed Weights   09/18/20 1049  Weight: 161 lb 14.4 oz (73.4 kg)    BREAST: No palpable masses or nodules in either right or left breasts. No palpable axillary supraclavicular or infraclavicular adenopathy no breast tenderness or nipple discharge. (exam performed in the presence of a chaperone)  LABORATORY DATA:  I have reviewed the data as listed CMP Latest Ref Rng & Units 06/06/2019 02/24/2019 02/03/2019  Glucose 70 - 99 mg/dL 101(H) 106(H) 84  BUN 8 - 23 mg/dL _0 Creatinine 0.44 - 1.00 mg/dL 1.14(H) 1.13(H) 0.80  Sodium 135 - 145 mmol/L 138 137 139  Potassium 3.5 - 5.1 mmol/L 4.6 4.1 4.0  Chloride 98 - 111 mmol/L 105 105 110  CO2 22 - 32 mmol/L 22 22 21(L)  Calcium 8.9 - 10.3 mg/dL 9.3 9.3 8.9  Total Protein 6.5 - 8.1 g/dL 7.8 - 6.9  Total Bilirubin 0.3 - 1.2 mg/dL 0.5 - 0.5  Alkaline Phos 38 - 126 U/L 79 - 70  AST 15 - 41 U/L 19 - 17  ALT 0 - 44 U/L 12 - 12    Lab Results  Component Value Date   WBC 10.3 06/06/2019   HGB 11.0 (L) 06/06/2019   HCT 33.0 (L) 06/06/2019   MCV 87.8 06/06/2019   PLT 408 (H) 06/06/2019   NEUTROABS 6.9 06/06/2019    ASSESSMENT & PLAN:  Malignant neoplasm of upper-inner quadrant of left breast in  female, estrogen receptor positive ([facility]) 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, copy #16.3, axillary lymph node biopsy benign concordant, T2 N0 stage II a clinical stage AJCC 8  Treatment plan: 1.Neoadjuvant chemotherapy with TCH Perjeta x 3cycles (discontinued early due to toxicities of severe diarrhea nausea and vomiting)02/26/2018 to 5/16/2019followed by Herceptin Perjeta maintenance for 1 year completed 02/24/2019 2.followed by breast conserving surgery9/10/2018: Pathologic complete response 77fllowed by adjuvant radiation10/15/2019-10/04/2018 4.Followed by adjuvant antiestrogen therapy with letrozole for 5-7 years started 11/11/2018 ------------------------------------------------------------------------------------------------------------------------ Current treatment: Letrozole 2.5 mg daily started 11/11/2018 discontinued 09/04/2020, switching to anastrozole Letrozole toxicities: 1.  Nausea: Improved 2.   hot flashes 3.  Multiple other problems including depression, weight gain, arthralgias, insomnia, dizziness: Uncertain  how many of these are letrozole related but we decided to discontinue it.  After she discontinued it some of the symptoms did improve. Therefore we decided to switch her to anastrozole therapy.  I sent a prescription to her pharmacy.  Breast discomfort: Tenderness along the left axillary surgical scars.    Breast cancer surveillance: 1.  Mammogram 06/18/2020: No evidence of malignancy in either breast.  Expected postsurgical changes including benign oil cyst and dystrophic calcifications along the lumpectomy scar. 2. breast exam:: Tenderness but otherwise benign  Depression: Patient's primary care physician is monitoring it. Hypertension: Discussed with her about controlling her blood pressure with her  PCP. Insomnia:  On Ambien. I believe most of her symptoms are stress related.    We can see how she does on anastrozole when she comes back to see [place] in 3 months.    No orders of the defined types were placed in this encounter.  The patient has a good understanding of the overall plan. she agrees with it. she will call with any problems that may develop before the next visit here.  Total time spent: 30 mins including face to face time and time spent for planning, charting and coordination of care  Nicholas Lose, MD 09/18/2020  I, Cloyde Reams Dorshimer, am acting as scribe for Dr. Nicholas Lose.  I have reviewed the above documentation for accuracy and completeness, and I agree with the above.

## 2020-09-18 ENCOUNTER — Other Ambulatory Visit: Payer: Self-pay

## 2020-09-18 ENCOUNTER — Inpatient Hospital Stay: Payer: Medicare HMO | Attending: Hematology and Oncology | Admitting: Hematology and Oncology

## 2020-09-18 DIAGNOSIS — Z791 Long term (current) use of non-steroidal anti-inflammatories (NSAID): Secondary | ICD-10-CM | POA: Diagnosis not present

## 2020-09-18 DIAGNOSIS — Z79811 Long term (current) use of aromatase inhibitors: Secondary | ICD-10-CM | POA: Insufficient documentation

## 2020-09-18 DIAGNOSIS — Z923 Personal history of irradiation: Secondary | ICD-10-CM | POA: Diagnosis not present

## 2020-09-18 DIAGNOSIS — G4700 Insomnia, unspecified: Secondary | ICD-10-CM | POA: Insufficient documentation

## 2020-09-18 DIAGNOSIS — I10 Essential (primary) hypertension: Secondary | ICD-10-CM | POA: Insufficient documentation

## 2020-09-18 DIAGNOSIS — Z170 Estrogen receptor positive status [ER+]: Secondary | ICD-10-CM | POA: Diagnosis not present

## 2020-09-18 DIAGNOSIS — Z9221 Personal history of antineoplastic chemotherapy: Secondary | ICD-10-CM | POA: Diagnosis not present

## 2020-09-18 DIAGNOSIS — C50212 Malignant neoplasm of upper-inner quadrant of left female breast: Secondary | ICD-10-CM | POA: Insufficient documentation

## 2020-09-18 DIAGNOSIS — R232 Flushing: Secondary | ICD-10-CM | POA: Diagnosis not present

## 2020-09-18 DIAGNOSIS — Z79899 Other long term (current) drug therapy: Secondary | ICD-10-CM | POA: Insufficient documentation

## 2020-09-18 DIAGNOSIS — F32A Depression, unspecified: Secondary | ICD-10-CM | POA: Diagnosis not present

## 2020-09-18 MED ORDER — ANASTROZOLE 1 MG PO TABS
1.0000 mg | ORAL_TABLET | Freq: Every day | ORAL | 3 refills | Status: DC
Start: 2020-09-18 — End: 2021-07-04

## 2020-09-18 NOTE — Assessment & Plan Note (Signed)
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, copy #16.3, axillary lymph node biopsy benign concordant, T2 N0 stage II a clinical stage AJCC 8  Treatment plan: 1.Neoadjuvant chemotherapy with TCH Perjeta x 3cycles (discontinued early due to toxicities of severe diarrhea nausea and vomiting)02/26/2018 to 5/16/2019followed by Herceptin Perjeta maintenance for 1 year completed 02/24/2019 2.followed by breast conserving surgery9/10/2018: Pathologic complete response 29fllowed by adjuvant radiation10/15/2019-10/04/2018 4.Followed by adjuvant antiestrogen therapy with letrozole for 5-7 years started 11/11/2018 ------------------------------------------------------------------------------------------------------------------------ Current treatment: Letrozole 2.5 mg daily started 11/11/2018 Letrozole toxicities: 1.  Nausea: Improved 2.  Occasional hot flashes  Breast discomfort: Tenderness along the left axillary surgical scars.    Breast cancer surveillance: 1.  Mammogram 06/18/2020: No evidence of malignancy in either breast.  Expected postsurgical changes including benign oil cyst and dystrophic calcifications along the lumpectomy scar. 2. breast exam: 09/18/2020: Tenderness but otherwise benign  History of chemotherapy-induced anemia: Insomnia:  On Ambien. I believe most of her symptoms are stress related.    Return to clinic in 1 year for follow-up

## 2020-10-08 ENCOUNTER — Other Ambulatory Visit: Payer: Self-pay | Admitting: Oncology

## 2020-12-20 ENCOUNTER — Ambulatory Visit: Payer: Medicare HMO | Admitting: Hematology and Oncology

## 2021-01-02 ENCOUNTER — Other Ambulatory Visit: Payer: Self-pay | Admitting: Oncology

## 2021-01-11 ENCOUNTER — Other Ambulatory Visit: Payer: Self-pay | Admitting: Orthopedic Surgery

## 2021-01-11 DIAGNOSIS — M898X9 Other specified disorders of bone, unspecified site: Secondary | ICD-10-CM

## 2021-01-11 DIAGNOSIS — M8580 Other specified disorders of bone density and structure, unspecified site: Secondary | ICD-10-CM

## 2021-01-19 IMAGING — MG DIGITAL DIAGNOSTIC BILAT W/ TOMO W/ CAD
6 of 10 series · 6 of 26 positions shown · non-contrast
Comparison: Previous exam(s).

CLINICAL DATA: Malignant lumpectomy of the UPPER INNER QUADRANT of
the LEFT breast in 3737. Annual evaluation.

EXAM:
DIGITAL DIAGNOSTIC BILATERAL MAMMOGRAM WITH TOMO AND CAD

[L CC]
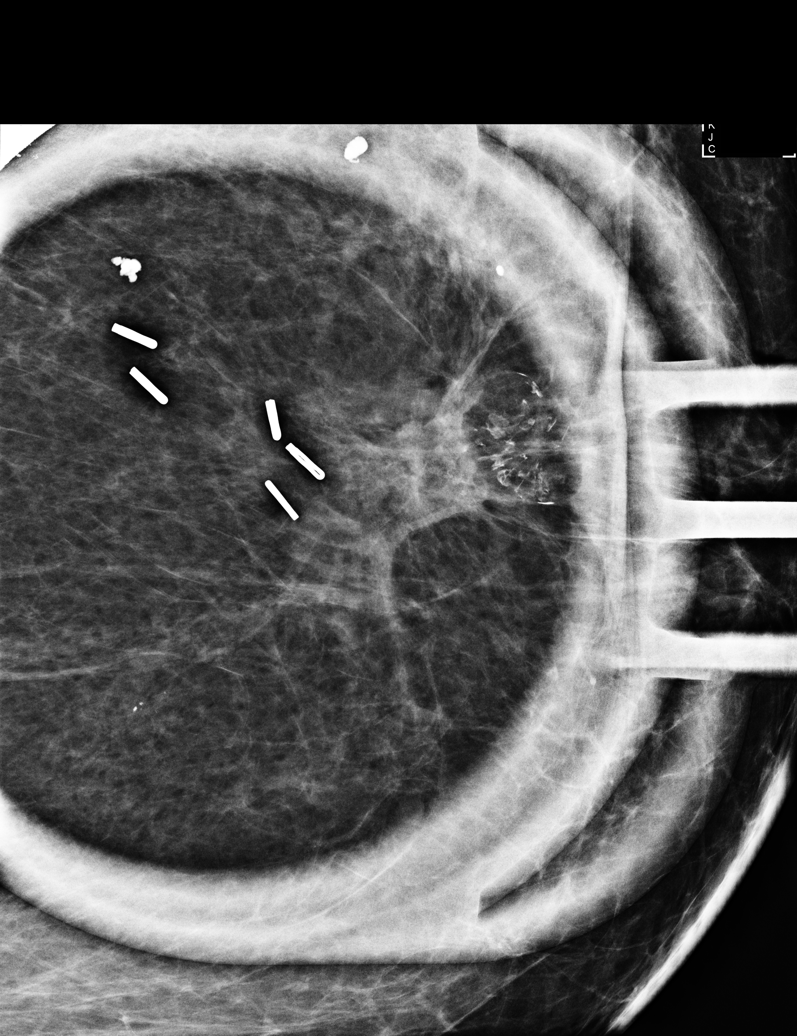

[R CC]
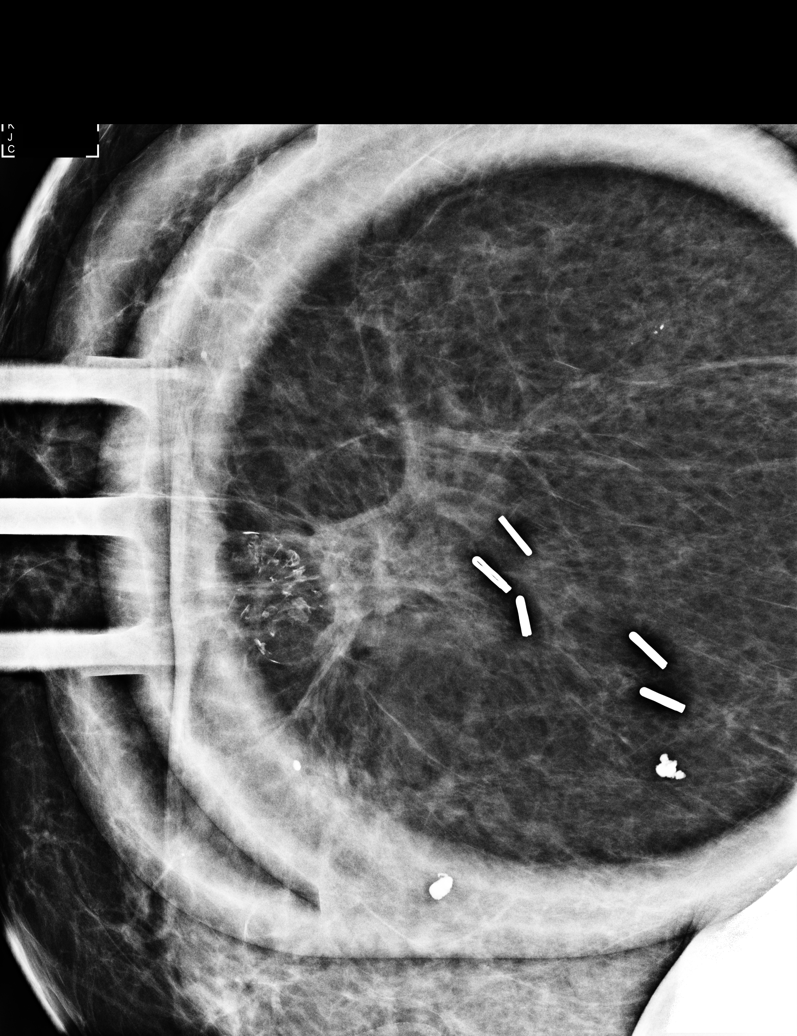

[R CC synth-2D]
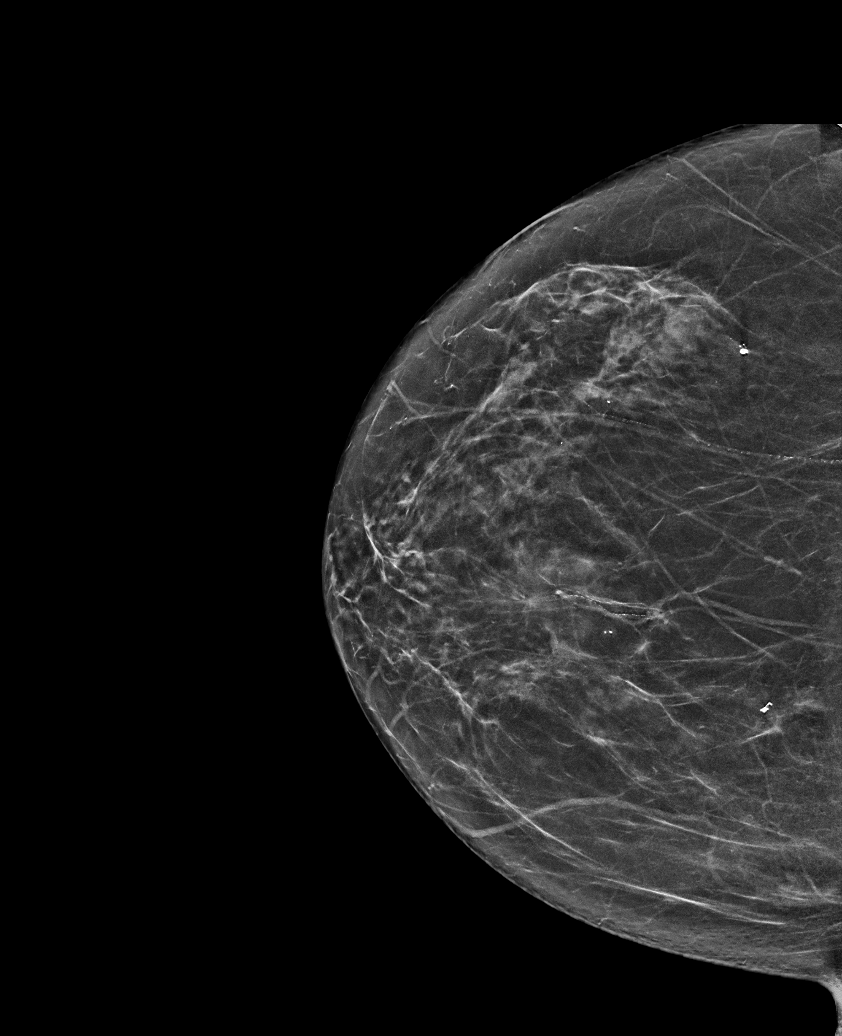

[R MLO synth-2D]
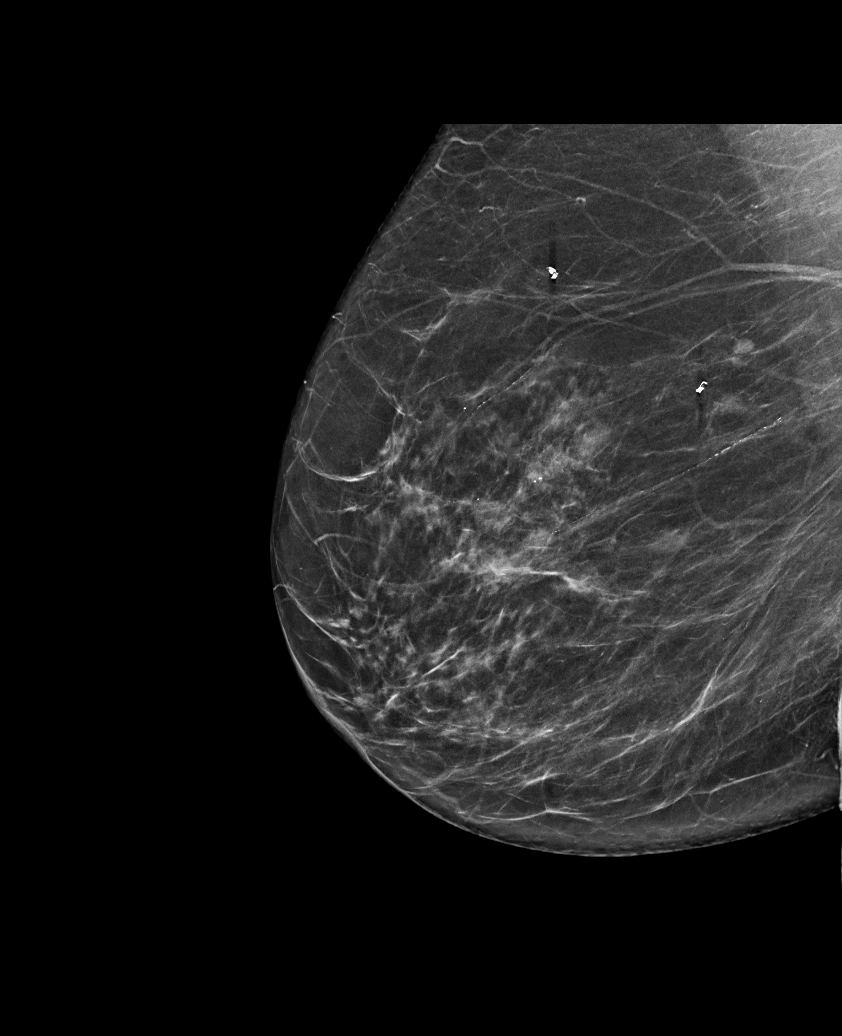

[L MLO synth-2D]
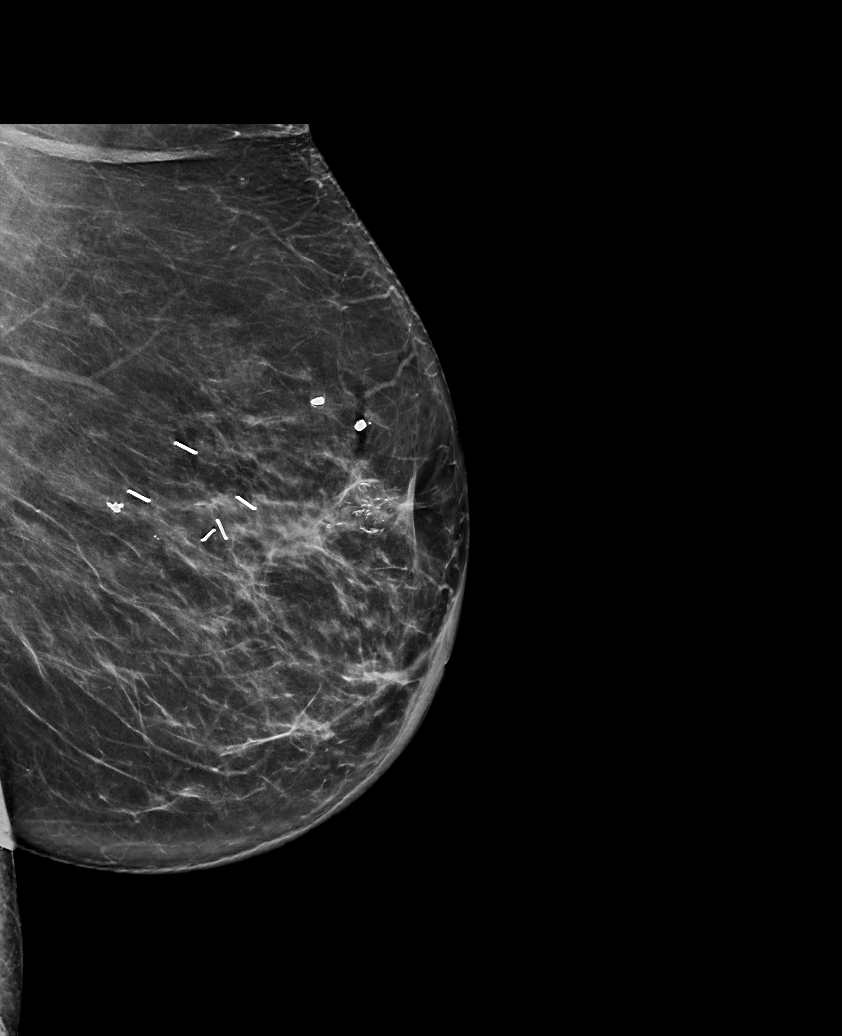

[L CC synth-2D]
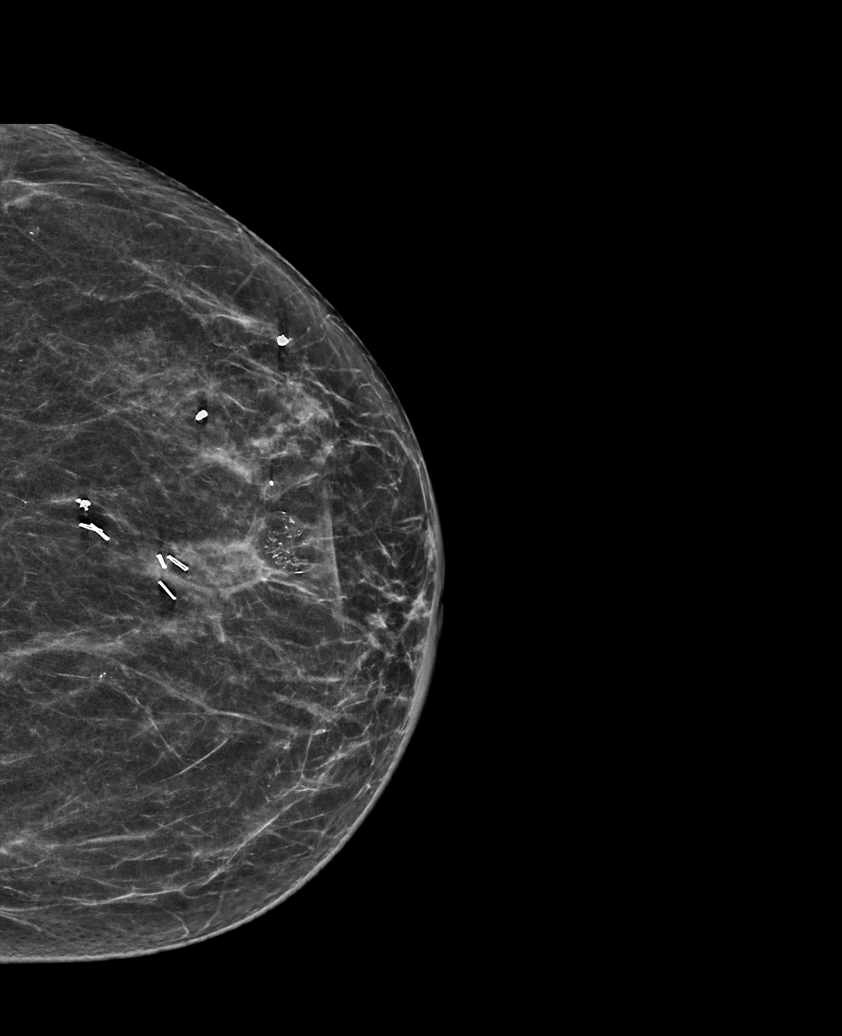

[6 of 26 positions shown; findings below may reference images not displayed]

ACR Breast Density Category b: There are scattered areas of
fibroglandular density.
FINDINGS: Tomosynthesis and synthesized full field CC and MLO views of both
breasts were obtained. Standard spot magnification CC view of the
lumpectomy site in the LEFT breast was also obtained.

Post surgical scar/architectural distortion at the lumpectomy site
in the UPPER LEFT breast at ANTERIOR to MIDDLE depth, associated
with a benign oil cyst and associated dystrophic calcifications at
the anterior margin of the scar. No findings suspicious for
malignancy in the LEFT breast.

No findings suspicious for malignancy in the RIGHT breast.

Mammographic images were processed with CAD.
IMPRESSION: 1. No mammographic evidence of malignancy involving either breast.
2. Expected post lumpectomy changes involving the LEFT breast,
including a benign oil cyst with associated dystrophic
calcifications at the anterior margin of the lumpectomy scar.

RECOMMENDATION:
BILATERAL diagnostic mammography in 1 year.

I have discussed the findings and recommendations with the patient.
If applicable, a reminder letter will be sent to the patient
regarding the next appointment.

BI-RADS CATEGORY  2: Benign.

## 2021-01-22 NOTE — Assessment & Plan Note (Deleted)
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, copy #16.3, axillary lymph node biopsy benign concordant, T2 N0 stage II a clinical stage AJCC 8  Treatment plan: 1.Neoadjuvant chemotherapy with TCH Perjeta x 3cycles (discontinued early due to toxicities of severe diarrhea nausea and vomiting)02/26/2018 to 5/16/2019followed by Herceptin Perjeta maintenance for 1 yearcompleted 02/24/2019 2.followed by breast conserving surgery9/10/2018: Pathologic complete response 41fllowed by adjuvant radiation10/15/2019-10/04/2018 4.Followed by adjuvant antiestrogen therapy with letrozole for 5-7 years started 11/11/2018 ------------------------------------------------------------------------------------------------------------------------ Current treatment:Letrozole 2.5 mg daily started 11/11/2018 discontinued 09/04/2020, switching to anastrozole Letrozole toxicities: 1.Nausea: Improved 2. hot flashes 3.  Multiple other problems including depression, weight gain, arthralgias, insomnia, dizziness: Uncertain how many of these are letrozole related but we decided to discontinue it.  After she discontinued it some of the symptoms did improve. Therefore we decided to switch her to anastrozole therapy.  I sent a prescription to her pharmacy.  Breast discomfort:Tenderness along the left axillary surgical scars.  Breast cancer surveillance: 1.  Mammogram 06/18/2020: No evidence of malignancy in either breast.  Expected postsurgical changes including benign oil cyst and dystrophic calcifications along the lumpectomy scar. 2. breast exam:: Tenderness but otherwise benign  Depression: Patient's primary care physician is monitoring it. Hypertension: Discussed with her about controlling her blood pressure with her PCP. Insomnia: On Ambien. I believe most of her  symptoms are stress related.

## 2021-01-22 NOTE — Progress Notes (Incomplete)
Patient Care Team: [name], MD as PCP - General (Internal Medicine) Eppie Gibson, MD as Attending Physician (Radiation Oncology) Nicholas Lose, MD as Consulting Physician (Hematology and Oncology) Erroll Luna, MD as Consulting Physician (General Surgery)  DIAGNOSIS:    ICD-10-CM   1. Malignant neoplasm of upper-inner quadrant of left breast in female, estrogen receptor positive ([place])  C50.212    Z17.0     SUMMARY OF ONCOLOGIC HISTORY: Oncology History  Malignant neoplasm of upper-inner quadrant of left breast in female, estrogen receptor positive ([place])  02/03/2018 Initial Diagnosis   Left breast palpable lump at 1130 position 2.3 cm, at 11 o'clock position 6 mm which was benign; biopsy revealed IDC grade 3 ER 30%, PR 0%, Ki-67 30%, HER-2 positive ratio 5.62, copy #16.3, axillary lymph node biopsy benign concordant, T2 N0 stage II a clinical stage AJCC 8   02/11/2018 Genetic Testing   Negative. Two Variants of Uncertain Significance were detected: ALK c.1190A>T (p.Asp397Val) and PMS2 c.1058C>T (p.Ala353Val).  Genes tested include: ALK, APC, ATM, AXIN2, BAP1, BARD1, BLM, BMPR1A, BRCA1, BRCA2, BRIP1, CASR, CDC73, CDH1, CDK4, CDKN1B, CDKN1C, CDKN2A, CEBPA, CHEK2, CTNNA1, DICER1, DIS3L2, EGFR, EPCAM, FH, FLCN, GATA2, GPC3, GREM1, HOXB13, HRAS, KIT, MAX, MEN1, MET, MITF, MLH1, MSH2, MSH3, MSH6, MUTYH, NBN, NF1, NF2, NTHL1, PALB2, PDGFRA, PHOX2B, PMS2, POLD1, POLE, POT1, PRKAR1A, PTCH1, PTEN, RAD50, RAD51C, RAD51D, RB1, RECQL4, RET, RUNX1, SDHA, SDHAF2, SDHB, SDHC, SDHD, SMAD4, SMARCA4, SMARCB1, SMARCE1, STK11, SUFU, TERC, TERT, TMEM127, TP53, TSC1, TSC2, VHL, WRN, WT1.   02/18/2018 Breast MRI   Solitary mass in left breast upper outer quadrant 2.5 cm.  No additional enhancement in the right breast.  No adenopathy   02/26/2018 - 04/15/2018 Neo-Adjuvant Chemotherapy   TCH Perjeta X 3 cycles (stopped early due to toxicities) followed by Herceptin Perjeta maintenance   06/18/2018 Breast MRI    Marked reduction in the abnormal enhancement in the upper inner left breast at the site of known cancer.  There is approximately 2.5 cm of residual linear non mass enhancement at the site of known cancer.   08/12/2018 Surgery   Left lumpectomy: No residual cancer identified 0/4 lymph nodes negative, complete pathologic response   09/14/2018 - 10/04/2018 Radiation Therapy   Adjuvant radiation therapy   Left Breast / 42.56 Gy in 16 fractions   11/11/2018 -  Anti-estrogen oral therapy   Letrozole 2.88m daily     CHIEF COMPLIANT: Follow-up ofleft breast cancer onletrozole therapy  INTERVAL HISTORY: Michelle GROESBECKis a 75y.o. with above-mentioned history of HER-2 positive left breast cancertreated with neoadjuvant chemotherapy, lumpectomy, radiation, Herceptin Perjeta maintenance, and who iscurrently on antiestrogen therapy with letrozole. She presents to the clinic todayfor follow-up.   ALLERGIES:  is allergic to codeine and demerol [meperidine].  MEDICATIONS:  Current Outpatient Medications  Medication Sig Dispense Refill  . amLODipine (NORVASC) 10 MG tablet Take 10 mg by mouth daily.     .Marland Kitchenanastrozole (ARIMIDEX) 1 MG tablet Take 1 tablet (1 mg total) by mouth daily. 90 tablet 3  . furosemide (LASIX) 20 MG tablet Take 20 mg by mouth daily as needed for edema.    . gabapentin (NEURONTIN) 300 MG capsule Take 300 mg by mouth 3 (three) times daily as needed.    . hydrochlorothiazide (HYDRODIURIL) 25 MG tablet Take 1 tablet (25 mg total) by mouth daily. 90 tablet 3  . hydrOXYzine (ATARAX/VISTARIL) 50 MG tablet Take 50 mg by mouth 3 (three) times daily as needed.    . meloxicam (MOBIC)  7.5 MG tablet TAKE 1 TABLET TWICE DAILY AS NEEDED FOR PAIN 180 tablet 0  . omeprazole (PRILOSEC) 20 MG capsule Take 20 mg by mouth daily.    . pantoprazole (PROTONIX) 40 MG tablet     . pramipexole (MIRAPEX) 0.25 MG tablet Take 0.25-1 mg by mouth at bedtime.     Marland Kitchen zolpidem (AMBIEN) 5 MG tablet TAKE 1  TABLET BY MOUTH AT BEDTIME AS NEEDED FOR SLEEP 30 tablet 0   No current facility-administered medications for this visit.    PHYSICAL EXAMINATION: ECOG PERFORMANCE STATUS: {CHL ONC ECOG [phone]}  There were no vitals filed for this visit. There were no vitals filed for this visit.  BREAST:*** No palpable masses or nodules in either right or left breasts. No palpable axillary supraclavicular or infraclavicular adenopathy no breast tenderness or nipple discharge. (exam performed in the presence of a chaperone)  LABORATORY DATA:  I have reviewed the data as listed CMP Latest Ref Rng & Units 06/06/2019 02/24/2019 02/03/2019  Glucose 70 - 99 mg/dL 101(H) 106(H) 84  BUN 8 - 23 mg/dL _0 Creatinine 0.44 - 1.00 mg/dL 1.14(H) 1.13(H) 0.80  Sodium 135 - 145 mmol/L 138 137 139  Potassium 3.5 - 5.1 mmol/L 4.6 4.1 4.0  Chloride 98 - 111 mmol/L 105 105 110  CO2 22 - 32 mmol/L 22 22 21(L)  Calcium 8.9 - 10.3 mg/dL 9.3 9.3 8.9  Total Protein 6.5 - 8.1 g/dL 7.8 - 6.9  Total Bilirubin 0.3 - 1.2 mg/dL 0.5 - 0.5  Alkaline Phos 38 - 126 U/L 79 - 70  AST 15 - 41 U/L 19 - 17  ALT 0 - 44 U/L 12 - 12    Lab Results  Component Value Date   WBC 10.3 06/06/2019   HGB 11.0 (L) 06/06/2019   HCT 33.0 (L) 06/06/2019   MCV 87.8 06/06/2019   PLT 408 (H) 06/06/2019   NEUTROABS 6.9 06/06/2019    ASSESSMENT & PLAN:  No problem-specific Assessment & Plan notes found for this encounter.    No orders of the defined types were placed in this encounter.  The patient has a good understanding of the overall plan. she agrees with it. she will call with any problems that may develop before the next visit here.  Total time spent: *** mins including face to face time and time spent for planning, charting and coordination of care  Rulon Eisenmenger, MD, MPH 01/22/2021  I, Cloyde Reams Dorshimer, am acting as scribe for Dr. Nicholas Lose.  {insert scribe attestation}

## 2021-01-23 ENCOUNTER — Inpatient Hospital Stay: Payer: Medicare HMO | Admitting: Hematology and Oncology

## 2021-01-23 DIAGNOSIS — C50212 Malignant neoplasm of upper-inner quadrant of left female breast: Secondary | ICD-10-CM

## 2021-01-28 ENCOUNTER — Ambulatory Visit
Admission: RE | Admit: 2021-01-28 | Discharge: 2021-01-28 | Disposition: A | Discharge status: Home / Self Care | Payer: Medicare HMO | Source: Ambulatory Visit | Attending: Orthopedic Surgery | Admitting: Orthopedic Surgery

## 2021-01-28 ENCOUNTER — Other Ambulatory Visit: Payer: Self-pay

## 2021-01-28 DIAGNOSIS — M898X9 Other specified disorders of bone, unspecified site: Secondary | ICD-10-CM

## 2021-01-28 DIAGNOSIS — M8580 Other specified disorders of bone density and structure, unspecified site: Secondary | ICD-10-CM

## 2021-02-13 NOTE — Progress Notes (Signed)
Patient Care Team: [name], MD as PCP - General (Internal Medicine) Eppie Gibson, MD as Attending Physician (Radiation Oncology) Nicholas Lose, MD as Consulting Physician (Hematology and Oncology) Erroll Luna, MD as Consulting Physician (General Surgery)  DIAGNOSIS:    ICD-10-CM   1. Malignant neoplasm of upper-inner quadrant of left breast in female, estrogen receptor positive ([place])  C50.212    Z17.0     SUMMARY OF ONCOLOGIC HISTORY: Oncology History  Malignant neoplasm of upper-inner quadrant of left breast in female, estrogen receptor positive ([place])  02/03/2018 Initial Diagnosis   Left breast palpable lump at 1130 position 2.3 cm, at 11 o'clock position 6 mm which was benign; biopsy revealed IDC grade 3 ER 30%, PR 0%, Ki-67 30%, HER-2 positive ratio 5.62, copy #16.3, axillary lymph node biopsy benign concordant, T2 N0 stage II a clinical stage AJCC 8   02/11/2018 Genetic Testing   Negative. Two Variants of Uncertain Significance were detected: ALK c.1190A>T (p.Asp397Val) and PMS2 c.1058C>T (p.Ala353Val).  Genes tested include: ALK, APC, ATM, AXIN2, BAP1, BARD1, BLM, BMPR1A, BRCA1, BRCA2, BRIP1, CASR, CDC73, CDH1, CDK4, CDKN1B, CDKN1C, CDKN2A, CEBPA, CHEK2, CTNNA1, DICER1, DIS3L2, EGFR, EPCAM, FH, FLCN, GATA2, GPC3, GREM1, HOXB13, HRAS, KIT, MAX, MEN1, MET, MITF, MLH1, MSH2, MSH3, MSH6, MUTYH, NBN, NF1, NF2, NTHL1, PALB2, PDGFRA, PHOX2B, PMS2, POLD1, POLE, POT1, PRKAR1A, PTCH1, PTEN, RAD50, RAD51C, RAD51D, RB1, RECQL4, RET, RUNX1, SDHA, SDHAF2, SDHB, SDHC, SDHD, SMAD4, SMARCA4, SMARCB1, SMARCE1, STK11, SUFU, TERC, TERT, TMEM127, TP53, TSC1, TSC2, VHL, WRN, WT1.   02/18/2018 Breast MRI   Solitary mass in left breast upper outer quadrant 2.5 cm.  No additional enhancement in the right breast.  No adenopathy   02/26/2018 - 04/15/2018 Neo-Adjuvant Chemotherapy   TCH Perjeta X 3 cycles (stopped early due to toxicities) followed by Herceptin Perjeta maintenance   06/18/2018 Breast MRI    Marked reduction in the abnormal enhancement in the upper inner left breast at the site of known cancer.  There is approximately 2.5 cm of residual linear non mass enhancement at the site of known cancer.   08/12/2018 Surgery   Left lumpectomy: No residual cancer identified 0/4 lymph nodes negative, complete pathologic response   09/14/2018 - 10/04/2018 Radiation Therapy   Adjuvant radiation therapy   Left Breast / 42.56 Gy in 16 fractions   11/11/2018 -  Anti-estrogen oral therapy   Letrozole 2.51m daily     CHIEF COMPLIANT: Follow-up ofleft breast cancer onletrozole therapy  INTERVAL HISTORY: Michelle RIGHETTIis a 75y.o. with above-mentioned history of HER-2 positive left breast cancertreated with neoadjuvant chemotherapy, lumpectomy, radiation, Herceptin Perjeta maintenance, and who iscurrently on antiestrogen therapy with letrozole.She presents to the clinic todayfor follow-up.  Lately she has been experiencing problems with the dizziness on standing and had 2 episodes of loss of consciousness.  Her primary care physician is working her up.  They took her off one of her blood pressure medications.  She denies any problems taking anastrozole.  She does have tenderness in the left breast especially when she leans forward.  Left hand inflammation for which she was given prednisone recently.  ALLERGIES:  is allergic to codeine and demerol [meperidine].  MEDICATIONS:  Current Outpatient Medications  Medication Sig Dispense Refill  . amLODipine (NORVASC) 10 MG tablet Take 10 mg by mouth daily.     .Marland Kitchenanastrozole (ARIMIDEX) 1 MG tablet Take 1 tablet (1 mg total) by mouth daily. 90 tablet 3  . furosemide (LASIX) 20 MG tablet Take 20 mg by mouth  daily as needed for edema.    . gabapentin (NEURONTIN) 300 MG capsule Take 300 mg by mouth 3 (three) times daily as needed.    . hydrochlorothiazide (HYDRODIURIL) 25 MG tablet Take 1 tablet (25 mg total) by mouth daily. 90 tablet 3  . hydrOXYzine  (ATARAX/VISTARIL) 50 MG tablet Take 50 mg by mouth 3 (three) times daily as needed.    . meloxicam (MOBIC) 7.5 MG tablet TAKE 1 TABLET TWICE DAILY AS NEEDED FOR PAIN 180 tablet 0  . omeprazole (PRILOSEC) 20 MG capsule Take 20 mg by mouth daily.    . pantoprazole (PROTONIX) 40 MG tablet     . pramipexole (MIRAPEX) 0.25 MG tablet Take 0.25-1 mg by mouth at bedtime.     Marland Kitchen zolpidem (AMBIEN) 5 MG tablet TAKE 1 TABLET BY MOUTH AT BEDTIME AS NEEDED FOR SLEEP 30 tablet 0   No current facility-administered medications for this visit.    PHYSICAL EXAMINATION: ECOG PERFORMANCE STATUS: 1 - Symptomatic but completely ambulatory  Vitals:   02/14/21 0922  BP: 126/65  Pulse: 79  Resp: 18  Temp: 98.2 F (36.8 C)  SpO2: 100%   Filed Weights   02/14/21 0922  Weight: 160 lb 4.8 oz (72.7 kg)    BREAST: No palpable masses or nodules in either right or left breasts. No palpable axillary supraclavicular or infraclavicular adenopathy no breast tenderness or nipple discharge. (exam performed in the presence of a chaperone)  LABORATORY DATA:  I have reviewed the data as listed CMP Latest Ref Rng & Units 06/06/2019 02/24/2019 02/03/2019  Glucose 70 - 99 mg/dL 101(H) 106(H) 84  BUN 8 - 23 mg/dL 20 23 15   Creatinine 0.44 - 1.00 mg/dL 1.14(H) 1.13(H) 0.80  Sodium 135 - 145 mmol/L 138 137 139  Potassium 3.5 - 5.1 mmol/L 4.6 4.1 4.0  Chloride 98 - 111 mmol/L 105 105 110  CO2 22 - 32 mmol/L 22 22 21(L)  Calcium 8.9 - 10.3 mg/dL 9.3 9.3 8.9  Total Protein 6.5 - 8.1 g/dL 7.8 - 6.9  Total Bilirubin 0.3 - 1.2 mg/dL 0.5 - 0.5  Alkaline Phos 38 - 126 U/L 79 - 70  AST 15 - 41 U/L 19 - 17  ALT 0 - 44 U/L 12 - 12    Lab Results  Component Value Date   WBC 10.3 06/06/2019   HGB 11.0 (L) 06/06/2019   HCT 33.0 (L) 06/06/2019   MCV 87.8 06/06/2019   PLT 408 (H) 06/06/2019   NEUTROABS 6.9 06/06/2019    ASSESSMENT & PLAN:  Malignant neoplasm of upper-inner quadrant of left breast in female, estrogen receptor  positive ([facility]) 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, copy #16.3, axillary lymph node biopsy benign concordant, T2 N0 stage II a clinical stage AJCC 8  Treatment plan: 1.Neoadjuvant chemotherapy with TCH Perjeta x 3cycles (discontinued early due to toxicities of severe diarrhea nausea and vomiting)02/26/2018 to 5/16/2019followed by Herceptin Perjeta maintenance for 1 yearcompleted 02/24/2019 2.followed by breast conserving surgery9/10/2018: Pathologic complete response 57fllowed by adjuvant radiation10/15/2019-10/04/2018 4.Followed by adjuvant antiestrogen therapy with letrozole for 5-7 years started 11/11/2018 ------------------------------------------------------------------------------------------------------------------------ Current treatment:Letrozole 2.5 mg daily started 11/11/2018 discontinued 09/04/2020, switching to anastrozole Anastrozole toxicities: Tolerating it extremely well.  Breast discomfort:Tenderness along the left axillary surgical scars. Breast cancer surveillance: 1.  Mammogram 06/18/2020: No evidence of malignancy in either breast.  Expected postsurgical changes including benign oil cyst and dystrophic calcifications along the lumpectomy scar. 2. breast exam:: Tenderness but otherwise benign  Depression: Patient's primary care physician is monitoring it. Hypertension: Discussed with her about controlling her blood pressure with her PCP. Insomnia: On Ambien. I believe most of her symptoms are stress related.  We can see how she does on anastrozole when she comes back to see [place] in 3 months.     No orders of the defined types were placed in this encounter.  The patient has a good understanding of the overall plan. she agrees with it. she will call with any problems that may develop before the next  visit here.  Total time spent: 20 mins including face to face time and time spent for planning, charting and coordination of care  Rulon Eisenmenger, MD, MPH 02/14/2021  I, Molly Dorshimer, am acting as scribe for Dr. Nicholas Lose.  I have reviewed the above documentation for accuracy and completeness, and I agree with the above.

## 2021-02-13 NOTE — Assessment & Plan Note (Signed)
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, copy #16.3, axillary lymph node biopsy benign concordant, T2 N0 stage II a clinical stage AJCC 8  Treatment plan: 1.Neoadjuvant chemotherapy with TCH Perjeta x 3cycles (discontinued early due to toxicities of severe diarrhea nausea and vomiting)02/26/2018 to 5/16/2019followed by Herceptin Perjeta maintenance for 1 yearcompleted 02/24/2019 2.followed by breast conserving surgery9/10/2018: Pathologic complete response 57fllowed by adjuvant radiation10/15/2019-10/04/2018 4.Followed by adjuvant antiestrogen therapy with letrozole for 5-7 years started 11/11/2018 ------------------------------------------------------------------------------------------------------------------------ Current treatment:Letrozole 2.5 mg daily started 11/11/2018 discontinued 09/04/2020, switching to anastrozole Letrozole toxicities: 1.Nausea: Improved 2. hot flashes 3.  Multiple other problems including depression, weight gain, arthralgias, insomnia, dizziness: Uncertain how many of these are letrozole related but we decided to discontinue it.  After she discontinued it some of the symptoms did improve. Therefore we decided to switch her to anastrozole therapy.  I sent a prescription to her pharmacy.  Breast discomfort:Tenderness along the left axillary surgical scars.  Breast cancer surveillance: 1.  Mammogram 06/18/2020: No evidence of malignancy in either breast.  Expected postsurgical changes including benign oil cyst and dystrophic calcifications along the lumpectomy scar. 2. breast exam:: Tenderness but otherwise benign  Depression: Patient's primary care physician is monitoring it. Hypertension: Discussed with her about controlling her blood pressure with her PCP. Insomnia: On Ambien. I believe most of her  symptoms are stress related.  We can see how she does on anastrozole when [place].

## 2021-02-14 ENCOUNTER — Inpatient Hospital Stay: Payer: Medicare HMO | Attending: Hematology and Oncology | Admitting: Hematology and Oncology

## 2021-02-14 ENCOUNTER — Other Ambulatory Visit: Payer: Self-pay

## 2021-02-14 DIAGNOSIS — Z923 Personal history of irradiation: Secondary | ICD-10-CM | POA: Insufficient documentation

## 2021-02-14 DIAGNOSIS — Z79811 Long term (current) use of aromatase inhibitors: Secondary | ICD-10-CM | POA: Insufficient documentation

## 2021-02-14 DIAGNOSIS — F329 Major depressive disorder, single episode, unspecified: Secondary | ICD-10-CM | POA: Diagnosis not present

## 2021-02-14 DIAGNOSIS — I10 Essential (primary) hypertension: Secondary | ICD-10-CM | POA: Diagnosis not present

## 2021-02-14 DIAGNOSIS — R42 Dizziness and giddiness: Secondary | ICD-10-CM | POA: Insufficient documentation

## 2021-02-14 DIAGNOSIS — R55 Syncope and collapse: Secondary | ICD-10-CM | POA: Diagnosis not present

## 2021-02-14 DIAGNOSIS — G4700 Insomnia, unspecified: Secondary | ICD-10-CM | POA: Diagnosis not present

## 2021-02-14 DIAGNOSIS — C50212 Malignant neoplasm of upper-inner quadrant of left female breast: Secondary | ICD-10-CM | POA: Insufficient documentation

## 2021-02-14 DIAGNOSIS — Z791 Long term (current) use of non-steroidal anti-inflammatories (NSAID): Secondary | ICD-10-CM | POA: Diagnosis not present

## 2021-02-14 DIAGNOSIS — Z170 Estrogen receptor positive status [ER+]: Secondary | ICD-10-CM | POA: Insufficient documentation

## 2021-02-14 DIAGNOSIS — Z9221 Personal history of antineoplastic chemotherapy: Secondary | ICD-10-CM | POA: Diagnosis not present

## 2021-02-14 DIAGNOSIS — Z79899 Other long term (current) drug therapy: Secondary | ICD-10-CM | POA: Diagnosis not present

## 2021-03-17 ENCOUNTER — Other Ambulatory Visit: Payer: Self-pay | Admitting: Adult Health

## 2021-05-31 ENCOUNTER — Other Ambulatory Visit: Payer: Self-pay | Admitting: Physician Assistant

## 2021-06-04 ENCOUNTER — Encounter: Payer: Self-pay | Admitting: Hematology and Oncology

## 2021-07-03 ENCOUNTER — Other Ambulatory Visit: Payer: Self-pay | Admitting: Hematology and Oncology

## 2021-07-04 ENCOUNTER — Encounter: Payer: Self-pay | Admitting: Hematology and Oncology

## 2021-11-16 ENCOUNTER — Ambulatory Visit (INDEPENDENT_AMBULATORY_CARE_PROVIDER_SITE_OTHER): Payer: Medicaid - Out of State

## 2021-11-16 ENCOUNTER — Encounter: Payer: Self-pay | Admitting: Hematology and Oncology

## 2021-11-16 ENCOUNTER — Ambulatory Visit
Admission: EM | Admit: 2021-11-16 | Discharge: 2021-11-16 | Disposition: A | Discharge status: Home / Self Care | Payer: Medicaid - Out of State | Attending: Internal Medicine | Admitting: Internal Medicine

## 2021-11-16 ENCOUNTER — Other Ambulatory Visit: Payer: Self-pay

## 2021-11-16 ENCOUNTER — Encounter: Payer: Self-pay | Admitting: Emergency Medicine

## 2021-11-16 DIAGNOSIS — M25561 Pain in right knee: Secondary | ICD-10-CM | POA: Diagnosis not present

## 2021-11-16 NOTE — ED Notes (Signed)
Patient did not like our selection of knee braces and does not wish to receive one from [place] today

## 2021-11-16 NOTE — ED Triage Notes (Signed)
Monday this week began having right knee pain. Constant, stabbing pain. Has tried tylenol, elevating the extremity, as well as icyhot without improvement. Walking makes it worse, has altered gait pattern. Denies injury to area as well as previous hx of injury or surgery. Hx of arthritis

## 2021-11-16 NOTE — ED Provider Notes (Addendum)
[facility]    CSN: [id number] Arrival date & time: 11/16/21  0846      History   Chief Complaint Chief Complaint  Patient presents with   Knee Pain    HPI [name] is a 75 y.o. female.   Patient presents with right knee pain that started approximately 6 days ago.  Denies any apparent injury.  Denies any past injury.  Patient does report that she has a history of arthritis but has never had arthritis in the right knee.  Patient reports the pain is described as a "stabbing pain" and is worsened by movement.  No relieving factors to pain.  Denies any numbness or tingling.  Patient has taken Tylenol, elevated extremity, used icy hot with no improvement in pain.   Knee Pain  Past Medical History:  Diagnosis Date   Anxiety    Arthritis    Breast cancer ([place])    Cough    Family history of breast cancer    Genetic testing 02/19/2018   Multi-Cancer panel (83 genes) @ [facility] - No pathogenic mutations detected   GERD (gastroesophageal reflux disease)    History of radiation therapy 09/13/18- 10/04/18   Left Breast 42.56 Gy in 16 fractions.    Hypertension    RLS (restless legs syndrome)     Patient Active Problem List   Diagnosis Date Noted   Chemotherapy induced nausea and vomiting 04/22/2018   Chemotherapy induced diarrhea 04/22/2018   Port-A-Cath in place 02/26/2018   Genetic testing 02/19/2018   Family history of breast cancer    Malignant neoplasm of upper-inner quadrant of left breast in female, estrogen receptor positive ([place]) 02/09/2018   Pain in finger of right hand 11/05/2016   Primary osteoarthritis of both first carpometacarpal joints 01/24/2016    Past Surgical History:  Procedure Laterality Date   ABDOMINAL HYSTERECTOMY     1970's due to ectopic pregnancy   BREAST BIOPSY Left 2016   BREAST BIOPSY Left 02/14/2018   BREAST BIOPSY Right 02/14/2018   benign   BREAST LUMPECTOMY Left 08/12/2018   BREAST LUMPECTOMY WITH RADIOACTIVE SEED  AND SENTINEL LYMPH NODE BIOPSY Left 08/12/2018   Procedure: LEFT BREAST LUMPECTOMY WITH RADIOACTIVE SEED AND SENTINEL LYMPH NODE BIOPSY;  Surgeon: Erroll Luna, MD;  Location: [facility];  Service: General;  Laterality: Left;   BREAST SURGERY     cyst removal   CARPOMETACARPEL SUSPENSION PLASTY Right 10/30/2016   Procedure: SUSPENSION PLASTY abductor pollicis longus transfer excision trapezium right;  Surgeon: Daryll Brod, MD;  Location: [facility];  Service: Orthopedics;  Laterality: Right;  axillary block  SUSPENSION PLASTY abductor pollicis longus transfer excision trapezium right   KIDNEY DONATION Left    PORT-A-CATH REMOVAL Right 06/09/2019   Procedure: REMOVAL PORT-A-CATH;  Surgeon: Erroll Luna, MD;  Location: [facility];  Service: General;  Laterality: Right;   PORTACATH PLACEMENT Right 02/24/2018   Procedure: INSERTION OF RIGHT INTERNAL JUGULAR PORT-A-CATH WITH ULTRA SOUND GUIDANCE ERAS PATHWAY;  Surgeon: Erroll Luna, MD;  Location: [facility];  Service: General;  Laterality: Right;   WRIST SURGERY     cyst removal    OB History   No obstetric history on file.      Home Medications    Prior to Admission medications   Medication Sig Start Date End Date Taking? Authorizing Provider  amLODipine (NORVASC) 10 MG tablet Take 10 mg by mouth daily.     [provider]  anastrozole (ARIMIDEX)  1 MG tablet TAKE 1 TABLET (1 MG TOTAL) BY MOUTH DAILY. 07/04/21   Nicholas Lose, MD  gabapentin (NEURONTIN) 300 MG capsule Take 300 mg by mouth 3 (three) times daily as needed.    [provider]  hydrOXYzine (ATARAX/VISTARIL) 50 MG tablet Take 50 mg by mouth 3 (three) times daily as needed.    [provider]  meloxicam (MOBIC) 7.5 MG tablet TAKE 1 TABLET TWICE DAILY AS NEEDED FOR PAIN 06/04/21   Nicholas Lose, MD  omeprazole (PRILOSEC) 20 MG capsule Take 20 mg by mouth daily.    [provider]  zolpidem (AMBIEN) 5 MG  tablet TAKE 1 TABLET BY MOUTH AT BEDTIME AS NEEDED FOR SLEEP 05/21/20   Nicholas Lose, MD    Family History Family History  Problem Relation Age of Onset   Liver cancer Mother        deceased 46   Lung cancer Father 64       deceased 79; smoker   Stomach cancer Maternal Grandmother        deceased 72   Cancer Paternal Uncle        deceased 55; unk. type   Breast cancer Maternal Aunt        dx 86s; deceased 29s   Breast cancer Maternal Aunt        dx 26s; deceased 22s   Breast cancer Cousin        daughter of mat aunt with breast ca    Social History Social History   Tobacco Use   Smoking status: Every Day    Packs/day: 0.50    Years: 47.00    Pack years: 23.50    Types: Cigarettes   Smokeless tobacco: Never  Vaping Use   Vaping Use: Never used  Substance Use Topics   Alcohol use: No   Drug use: No     Allergies   Codeine and Demerol [meperidine]   Review of Systems Review of Systems Per HPI  Physical Exam Triage Vital Signs ED Triage Vitals  Enc Vitals Group     BP 11/16/21 0929 (!) 182/83     Pulse Rate 11/16/21 0929 77     Resp 11/16/21 0929 16     Temp 11/16/21 0929 97.9 F (36.6 C)     Temp Source 11/16/21 0929 Oral     SpO2 11/16/21 0929 97 %     Weight --      Height --      Head Circumference --      Peak Flow --      Pain Score 11/16/21 0930 10     Pain Loc --      Pain Edu? --      Excl. in [place]? --    No data found.  Updated Vital Signs BP (!) 175/90 (BP Location: Right Arm)    Pulse 77    Temp 97.9 F (36.6 C) (Oral)    Resp 16    SpO2 97%   Visual Acuity Right Eye Distance:   Left Eye Distance:   Bilateral Distance:    Right Eye Near:   Left Eye Near:    Bilateral Near:     Physical Exam Constitutional:      General: She is not in acute distress.    Appearance: Normal appearance. She is not toxic-appearing or diaphoretic.  HENT:     Head: Normocephalic and atraumatic.  Eyes:     Extraocular Movements: Extraocular  movements intact.  Conjunctiva/sclera: Conjunctivae normal.  Cardiovascular:     Rate and Rhythm: Normal rate and regular rhythm.     Pulses: Normal pulses.     Heart sounds: Normal heart sounds.  Pulmonary:     Effort: Pulmonary effort is normal. No respiratory distress.     Breath sounds: Normal breath sounds.  Musculoskeletal:     Right knee: No swelling, deformity, effusion, erythema, bony tenderness or crepitus. Tenderness present. No LCL laxity, MCL laxity, ACL laxity or PCL laxity. Normal alignment, normal meniscus and normal patellar mobility. Normal pulse.     Left knee: Normal.       Legs:     Comments: Tenderness to palpation directly over the patella and directly beneath it.  Also having tenderness to palpation medial to patella.  No crepitus noted.  No pain with movement.  Neurovascular intact.  Neurological:     General: No focal deficit present.     Mental Status: She is alert and oriented to person, place, and time. Mental status is at baseline.     Cranial Nerves: Cranial nerves 2-12 are intact.     Sensory: Sensation is intact.     Motor: Motor function is intact.     Coordination: Coordination is intact.     Gait: Gait is intact.  Psychiatric:        Mood and Affect: Mood normal.        Behavior: Behavior normal.        Thought Content: Thought content normal.        Judgment: Judgment normal.     UC Treatments / Results  Labs (all labs ordered are listed, but only abnormal results are displayed) Labs Reviewed - No data to display  EKG   Radiology DG Knee Complete 4 Views Right  Result Date: 11/16/2021 CLINICAL DATA:  Atraumatic struck knee pain EXAM: RIGHT KNEE - COMPLETE 4+ VIEW COMPARISON:  None. FINDINGS: No evidence of fracture, dislocation, or joint effusion. No evidence of arthropathy or other focal bone abnormality. Soft tissues are unremarkable. IMPRESSION: Negative. Electronically Signed   By: Jorje Guild M.D.   On: 11/16/2021 10:13     Procedures Procedures (including critical care time)  Medications Ordered in UC Medications - No data to display  Initial Impression / Assessment and Plan / UC Course  I have reviewed the triage vital signs and the nursing notes.  Pertinent labs & imaging results that were available during my care of the patient were reviewed by me and considered in my medical decision making (see chart for details).     Right knee x-ray completed showing no acute bony abnormality.  Unable to determine exact etiology of patient's cause of pain.  Physical exam is not conclusive.  Discussed supportive care with patient.  Patient provided with knee brace in urgent care today but refused knee brace.  Discussed ice application.  Limited options on pain management given patient's history of comorbidities.  Tylenol as needed.  No red flags on exam and patient is neurovascularly intact.  Patient advised to follow-up with provided contact formation for orthopedist for further evaluation and management.  Patient had elevated blood pressure reading in urgent care today. No signs of hypertensive urgency, neuro exam was normal, no signs of endorgan damage.  Patient advised to monitor blood pressure at home with home cuff and to follow-up with PCP if it remains elevated.  Patient verbalized understanding and was agreeable with plan. Final Clinical Impressions(s) / UC Diagnoses   Final diagnoses:  Acute pain of right knee     Discharge Instructions      Your x-ray was normal.  Please follow-up with provided contact information for orthopedist for further evaluation and management.    ED Prescriptions   None    PDMP not reviewed this encounter.   Teodora Medici, [place] 11/16/21 1036    [address], [place] 11/16/21 1045

## 2021-11-16 NOTE — Discharge Instructions (Signed)
Your x-ray was normal.  Please follow-up with provided contact information for orthopedist for further evaluation and management.

## 2021-11-19 ENCOUNTER — Other Ambulatory Visit: Payer: Self-pay

## 2021-11-19 ENCOUNTER — Encounter: Payer: Self-pay | Admitting: Hematology and Oncology

## 2021-11-19 ENCOUNTER — Ambulatory Visit (INDEPENDENT_AMBULATORY_CARE_PROVIDER_SITE_OTHER): Payer: Medicare (Managed Care) | Admitting: Physician Assistant

## 2021-11-19 DIAGNOSIS — M25561 Pain in right knee: Secondary | ICD-10-CM | POA: Diagnosis not present

## 2021-11-19 MED ORDER — METHYLPREDNISOLONE ACETATE 40 MG/ML IJ SUSP
40.0000 mg | INTRAMUSCULAR | Status: AC | PRN
  Administered 2021-11-19: 11:00:00 40 mg via INTRA_ARTICULAR

## 2021-11-19 MED ORDER — LIDOCAINE HCL 1 % IJ SOLN
5.0000 mL | INTRAMUSCULAR | Status: AC | PRN
  Administered 2021-11-19: 11:00:00 5 mL

## 2021-11-19 NOTE — Progress Notes (Signed)
Office Visit Note   Patient: [name]           Date of Birth: 1946-08-31           MRN: [id number] Visit Date: 11/19/2021              Requested by: No referring provider defined for this encounter. PCP: Pcp, No  Chief Complaint  Patient presents with   Right Knee - Pain      HPI Patient is a pleasant 75 year old woman with a history of right knee pain in the last week.  She denies any injuries.  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  She was seen and evaluated in urgent care they told her to ice the knee and to take Tylenol.  She continues to have the pain that is globally around the knee.  Little bit more onto the kneecap.  She is try taking Tylenol.  Hurts after sitting for a while she denies any paresthesias denies any hip pain Assessment & Plan: Visit Diagnoses: No diagnosis found.  Plan: X-rays do demonstrate some early arthritis but no significant arthritis.  Given her exam she may have a degenerative meniscus tear or other cartilage defect.  For now we will go forward with a steroid injection and she is agreement with this plan.  If she does not improve she will call here we may call an MRI order and  Follow-Up Instructions: No follow-ups on file.   Ortho Exam  Patient is alert, oriented, no adenopathy, well-dressed, normal affect, normal respiratory effort Examination of her right knee no cellulitis no effusion no redness.  She has tenderness over the medial lateral compartments of the knee.  Lower leg is soft and nontender with some mild crepitus.  She has some pain with terminal extension and flexion good stability with anterior varus and valgus stress  Imaging: No results found. No images are attached to the encounter.  Labs: Lab Results  Component Value Date   ESRSEDRATE 13 06/21/2007   REPTSTATUS 03/21/2018 FINAL 03/16/2018   CULT  03/16/2018    NO GROWTH 5 DAYS Performed at [facility], [place] [address], [place], [place] [address]      Lab Results  Component Value Date   ALBUMIN 4.0 06/06/2019   ALBUMIN 3.9 02/03/2019   ALBUMIN 3.8 01/18/2019    Lab Results  Component Value Date   MG 1.7 08/26/2009   No results found for: VD25OH  No results found for: PREALBUMIN CBC EXTENDED Latest Ref Rng & Units 06/06/2019 02/24/2019 02/07/2019  WBC 4.0 - 10.5 K/uL 10.3 10.6(H) 8.9  RBC 3.87 - 5.11 MIL/uL 3.76(L) 4.47 3.60(L)  HGB 12.0 - 15.0 g/dL 11.0(L) 12.7 10.2(L)  HCT 36.0 - 46.0 % 33.0(L) 38.7 31.7(L)  PLT 150 - 400 K/uL 408(H) 419(H) 317  NEUTROABS 1.7 - 7.7 K/uL 6.9 7.5 5.5  LYMPHSABS 0.7 - 4.0 K/uL 2.4 2.3 2.4     There is no height or weight on file to calculate BMI.  Orders:  No orders of the defined types were placed in this encounter.  No orders of the defined types were placed in this encounter.    Procedures: Large Joint Inj on 11/19/2021 10:53 AM Indications: pain and diagnostic evaluation Details: 25 G 1.5 in needle, anteromedial approach  Arthrogram: No  Medications: 40 mg methylPREDNISolone acetate 40 MG/ML; 5 mL lidocaine 1 % Outcome: tolerated well, no immediate  complications Procedure, treatment alternatives, risks and benefits explained, specific risks discussed. Consent was given by the patient.    Clinical Data: No additional findings.  ROS:  All other systems negative, except as noted in the HPI. Review of Systems  Objective: Vital Signs: There were no vitals taken for this visit.  Specialty Comments:  No specialty comments available.  PMFS History: Patient Active Problem List   Diagnosis Date Noted   Chemotherapy induced nausea and vomiting 04/22/2018   Chemotherapy induced diarrhea 04/22/2018   Port-A-Cath in place 02/26/2018   Genetic testing 02/19/2018   Family history of breast cancer    Malignant neoplasm of upper-inner quadrant of left breast in female, estrogen receptor positive ([place]) 02/09/2018   Pain in  finger of right hand 11/05/2016   Primary osteoarthritis of both first carpometacarpal joints 01/24/2016   Past Medical History:  Diagnosis Date   Anxiety    Arthritis    Breast cancer ([place])    Cough    Family history of breast cancer    Genetic testing 02/19/2018   Multi-Cancer panel (83 genes) @ [facility] - No pathogenic mutations detected   GERD (gastroesophageal reflux disease)    History of radiation therapy 09/13/18- 10/04/18   Left Breast 42.56 Gy in 16 fractions.    Hypertension    RLS (restless legs syndrome)     Family History  Problem Relation Age of Onset   Liver cancer Mother        deceased 77   Lung cancer Father 19       deceased 59; smoker   Stomach cancer Maternal Grandmother        deceased 59   Cancer Paternal Uncle        deceased 2; unk. type   Breast cancer Maternal Aunt        dx 87s; deceased 34s   Breast cancer Maternal Aunt        dx 91s; deceased 44s   Breast cancer Cousin        daughter of mat aunt with breast ca    Past Surgical History:  Procedure Laterality Date   ABDOMINAL HYSTERECTOMY     1970's due to ectopic pregnancy   BREAST BIOPSY Left 2016   BREAST BIOPSY Left 02/14/2018   BREAST BIOPSY Right 02/14/2018   benign   BREAST LUMPECTOMY Left 08/12/2018   BREAST LUMPECTOMY WITH RADIOACTIVE SEED AND SENTINEL LYMPH NODE BIOPSY Left 08/12/2018   Procedure: LEFT BREAST LUMPECTOMY WITH RADIOACTIVE SEED AND SENTINEL LYMPH NODE BIOPSY;  Surgeon: Erroll Luna, MD;  Location: [facility];  Service: General;  Laterality: Left;   BREAST SURGERY     cyst removal   CARPOMETACARPEL SUSPENSION PLASTY Right 10/30/2016   Procedure: SUSPENSION PLASTY abductor pollicis longus transfer excision trapezium right;  Surgeon: Daryll Brod, MD;  Location: [facility];  Service: Orthopedics;  Laterality: Right;  axillary block  SUSPENSION PLASTY abductor pollicis longus transfer excision trapezium right   KIDNEY DONATION Left     PORT-A-CATH REMOVAL Right 06/09/2019   Procedure: REMOVAL PORT-A-CATH;  Surgeon: Erroll Luna, MD;  Location: [facility];  Service: General;  Laterality: Right;   PORTACATH PLACEMENT Right 02/24/2018   Procedure: INSERTION OF RIGHT INTERNAL JUGULAR PORT-A-CATH WITH ULTRA SOUND GUIDANCE ERAS PATHWAY;  Surgeon: Erroll Luna, MD;  Location: [facility];  Service: General;  Laterality: Right;   WRIST SURGERY     cyst removal   Social History   Occupational History  Not on file  Tobacco Use   Smoking status: Every Day    Packs/day: 0.50    Years: 47.00    Pack years: 23.50    Types: Cigarettes   Smokeless tobacco: Never  Vaping Use   Vaping Use: Never used  Substance and Sexual Activity   Alcohol use: No   Drug use: No   Sexual activity: Not on file

## 2021-11-20 ENCOUNTER — Encounter: Payer: Self-pay | Admitting: Hematology and Oncology

## 2021-11-27 ENCOUNTER — Encounter: Payer: Self-pay | Admitting: Hematology and Oncology

## 2021-11-28 ENCOUNTER — Encounter: Payer: Self-pay | Admitting: Hematology and Oncology

## 2022-02-18 ENCOUNTER — Encounter: Payer: Self-pay | Admitting: Hematology and Oncology

## 2022-02-18 ENCOUNTER — Inpatient Hospital Stay: Payer: Medicare HMO | Attending: Hematology and Oncology | Admitting: Hematology and Oncology

## 2022-02-18 NOTE — Progress Notes (Incomplete)
? ?Patient Care Team: ?Pcp, No as PCP - General ?Eppie Gibson, MD as Attending Physician (Radiation Oncology) ?Nicholas Lose, MD as Consulting Physician (Hematology and Oncology) ?Erroll Luna, MD as Consulting Physician (General Surgery) ? ?DIAGNOSIS:  ?Encounter Diagnosis  ?Name Primary?  ? Malignant neoplasm of upper-inner quadrant of left breast in female, estrogen receptor positive ([place])   ? ? ?SUMMARY OF ONCOLOGIC HISTORY: ?Oncology History  ?Malignant neoplasm of upper-inner quadrant of left breast in female, estrogen receptor positive ([place])  ?02/03/2018 Initial Diagnosis  ? Left breast palpable lump at 1130 position 2.3 cm, at 11 o'clock position 6 mm which was benign; biopsy revealed IDC grade 3 ER 30%, PR 0%, Ki-67 30%, HER-2 positive ratio 5.62, copy #16.3, axillary lymph node biopsy benign concordant, T2 N0 stage II a clinical stage AJCC 8 ?  ?02/11/2018 Genetic Testing  ? Negative. Two Variants of Uncertain Significance were detected: ALK c.1190A>T (p.Asp397Val) and PMS2 c.1058C>T (p.Ala353Val).  Genes tested include: ALK, APC, ATM, AXIN2, BAP1, BARD1, BLM, BMPR1A, BRCA1, BRCA2, BRIP1, CASR, CDC73, CDH1, CDK4, CDKN1B, CDKN1C, CDKN2A, CEBPA, CHEK2, CTNNA1, DICER1, DIS3L2, EGFR, EPCAM, FH, FLCN, GATA2, GPC3, GREM1, HOXB13, HRAS, KIT, MAX, MEN1, MET, MITF, MLH1, MSH2, MSH3, MSH6, MUTYH, NBN, NF1, NF2, NTHL1, PALB2, PDGFRA, PHOX2B, PMS2, POLD1, POLE, POT1, PRKAR1A, PTCH1, PTEN, RAD50, RAD51C, RAD51D, RB1, RECQL4, RET, RUNX1, SDHA, SDHAF2, SDHB, SDHC, SDHD, SMAD4, SMARCA4, SMARCB1, SMARCE1, STK11, SUFU, TERC, TERT, TMEM127, TP53, TSC1, TSC2, VHL, WRN, WT1. ?  ?02/18/2018 Breast MRI  ? Solitary mass in left breast upper outer quadrant 2.5 cm.  No additional enhancement in the right breast.  No adenopathy ?  ?02/26/2018 - 04/15/2018 Neo-Adjuvant Chemotherapy  ? [place] Perjeta X 3 cycles (stopped early due to toxicities) followed by Herceptin Perjeta maintenance ?  ?06/18/2018 Breast MRI  ? Marked reduction in the  abnormal enhancement in the upper inner left breast at the site of known cancer.  There is approximately 2.5 cm of residual linear non mass enhancement at the site of known cancer. ?  ?08/12/2018 Surgery  ? Left lumpectomy: No residual cancer identified 0/4 lymph nodes negative, complete pathologic response ?  ?09/14/2018 - 10/04/2018 Radiation Therapy  ? Adjuvant radiation therapy   Left Breast / 42.56 Gy in 16 fractions ?  ?11/11/2018 -  Anti-estrogen oral therapy  ? Letrozole 2.60m daily ?  ? ? ?CHIEF COMPLIANT:  ? ?INTERVAL HISTORY: Michelle MCLANEis a ? ? ?ALLERGIES:  is allergic to codeine and demerol [meperidine]. ? ?MEDICATIONS:  ?Current Outpatient Medications  ?Medication Sig Dispense Refill  ? amLODipine (NORVASC) 10 MG tablet Take 10 mg by mouth daily.     ? anastrozole (ARIMIDEX) 1 MG tablet TAKE 1 TABLET (1 MG TOTAL) BY MOUTH DAILY. 90 tablet 3  ? gabapentin (NEURONTIN) 300 MG capsule Take 300 mg by mouth 3 (three) times daily as needed.    ? hydrOXYzine (ATARAX/VISTARIL) 50 MG tablet Take 50 mg by mouth 3 (three) times daily as needed.    ? meloxicam (MOBIC) 7.5 MG tablet TAKE 1 TABLET TWICE DAILY AS NEEDED FOR PAIN 180 tablet 0  ? omeprazole (PRILOSEC) 20 MG capsule Take 20 mg by mouth daily.    ? zolpidem (AMBIEN) 5 MG tablet TAKE 1 TABLET BY MOUTH AT BEDTIME AS NEEDED FOR SLEEP 30 tablet 0  ? ?No current facility-administered medications for this visit.  ? ? ?PHYSICAL EXAMINATION: ?ECOG PERFORMANCE STATUS: {CHL ONC ECOG PZY:[id number]}? ?There were no vitals filed for this visit. ?There were no vitals  filed for this visit. ? ?BREAST:*** No palpable masses or nodules in either right or left breasts. No palpable axillary supraclavicular or infraclavicular adenopathy no breast tenderness or nipple discharge. (exam performed in the presence of a chaperone) ? ?LABORATORY DATA:  ?I have reviewed the data as listed ?CMP Latest Ref Rng & Units 06/06/2019 02/24/2019 02/03/2019  ?Glucose 70 - 99 mg/dL 101(H)  106(H) 84  ?BUN 8 - 23 mg/dL _0 ?Creatinine 0.44 - 1.00 mg/dL 1.14(H) 1.13(H) 0.80  ?Sodium 135 - 145 mmol/L 138 137 139  ?Potassium 3.5 - 5.1 mmol/L 4.6 4.1 4.0  ?Chloride 98 - 111 mmol/L 105 105 110  ?CO2 22 - 32 mmol/L 22 22 21(L)  ?Calcium 8.9 - 10.3 mg/dL 9.3 9.3 8.9  ?Total Protein 6.5 - 8.1 g/dL 7.8 - 6.9  ?Total Bilirubin 0.3 - 1.2 mg/dL 0.5 - 0.5  ?Alkaline Phos 38 - 126 U/L 79 - 70  ?AST 15 - 41 U/L 19 - 17  ?ALT 0 - 44 U/L 12 - 12  ? ? ?Lab Results  ?Component Value Date  ? WBC 10.3 06/06/2019  ? HGB 11.0 (L) 06/06/2019  ? HCT 33.0 (L) 06/06/2019  ? MCV 87.8 06/06/2019  ? PLT 408 (H) 06/06/2019  ? NEUTROABS 6.9 06/06/2019  ? ? ?ASSESSMENT & PLAN:  ?No problem-specific Assessment & Plan notes found for this encounter. ? ? ? ?No orders of the defined types were placed in this encounter. ? ?The patient has a good understanding of the overall plan. she agrees with it. she will call with any problems that may develop before the next visit here. ?Total time spent: 30 mins including face to face time and time spent for planning, charting and co-ordination of care ? ? Suzzette Righter, CMA ?02/18/22 ? ? ? I Ariez Neilan, Margaux Engen am acting as a scribe for Dr. Lindi Adie *** ?

## 2022-02-18 NOTE — Assessment & Plan Note (Deleted)
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, copy #16.3, axillary lymph node biopsy benign concordant, T2 N0 stage II a clinical stage AJCC 8 ?? ?Treatment plan: ?1.??Neoadjuvant chemotherapy with [place] Perjeta x 3?cycles (discontinued early due to toxicities of severe diarrhea nausea and vomiting)?02/26/2018 to 04/15/2018?followed by Herceptin Perjeta maintenance for 1 year?completed 02/24/2019 ?2.?followed by breast conserving surgery?08/12/2018: Pathologic complete response ?3?followed by adjuvant radiation?09/14/2018-10/04/2018 ?4.??Followed by adjuvant antiestrogen therapy with letrozole for 5-7 years started 11/11/2018 ?------------------------------------------------------------------------------------------------------------------------ ?Current treatment:?Letrozole 2.5 mg daily started 11/11/2018?discontinued 09/04/2020, switching to anastrozole ?Anastrozole toxicities: ?Tolerating it extremely well. ?? ?Breast discomfort:?Tenderness along the left axillary surgical scars.?? ?Breast cancer surveillance: ?1.??Mammogram 06/18/2020: No evidence of malignancy in either breast. ?Expected postsurgical changes including benign oil cyst and dystrophic calcifications along the lumpectomy scar. ?2.?breast exam 02/18/2022:: Tenderness but otherwise benign ?? ?Depression: Patient's primary care physician is monitoring it. ?Hypertension: Discussed with her about controlling her blood pressure with her PCP. ?Insomnia:??On Ambien. ?? ?Return to clinic in 1 year for follow-up ?

## 2022-04-02 ENCOUNTER — Telehealth: Payer: Self-pay

## 2022-04-02 NOTE — Telephone Encounter (Signed)
Pt called and LVM stating she no longer lives in [place], is now residing in [place]. Pt currently has no scheduled appts with [place]; however, Dr Lindi Adie has prescribed Anastrozole and we need to know if she is still taking this, or if we need to transfer her care to an oncologist in [place]. I called pt and LVM requesting call back.  ?

## 2022-05-31 ENCOUNTER — Other Ambulatory Visit: Payer: Self-pay | Admitting: Nurse Practitioner

## 2022-06-20 ENCOUNTER — Telehealth: Payer: Self-pay | Admitting: *Deleted

## 2022-06-20 NOTE — Telephone Encounter (Signed)
Received call from pt requesting recent MD visit and path report be faxed to Dr. Jerrye Noble [phone]). RN successfully faxed recent MD note, mammogram report, and pathology report.

## 2024-08-18 NOTE — Progress Notes (Signed)
 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 Amended report date 06/29/2024.
